# Patient Record
Sex: Female | Born: 1965 | Race: Black or African American | Hispanic: No | Marital: Married | Smoking: Never smoker
Health system: Southern US, Community
[De-identification: ages and names within clinical notes are randomized; demographics above are authoritative.]

## PROBLEM LIST (undated history)

## (undated) DIAGNOSIS — I10 Essential (primary) hypertension: Secondary | ICD-10-CM

## (undated) DIAGNOSIS — R252 Cramp and spasm: Secondary | ICD-10-CM

## (undated) DIAGNOSIS — E119 Type 2 diabetes mellitus without complications: Secondary | ICD-10-CM

## (undated) DIAGNOSIS — E1169 Type 2 diabetes mellitus with other specified complication: Secondary | ICD-10-CM

## (undated) DIAGNOSIS — H6122 Impacted cerumen, left ear: Secondary | ICD-10-CM

## (undated) DIAGNOSIS — E039 Hypothyroidism, unspecified: Secondary | ICD-10-CM

## (undated) DIAGNOSIS — E785 Hyperlipidemia, unspecified: Secondary | ICD-10-CM

## (undated) HISTORY — PX: ABDOMINAL HYSTERECTOMY: SHX81

## (undated) HISTORY — DX: Essential (primary) hypertension: I10

## (undated) HISTORY — DX: Impacted cerumen, left ear: H61.22

## (undated) HISTORY — DX: Hyperlipidemia, unspecified: E78.5

## (undated) HISTORY — DX: Type 2 diabetes mellitus without complications: E11.9

---
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
## 2007-02-12 ENCOUNTER — Ambulatory Visit: Payer: Self-pay | Admitting: Family Medicine

## 2007-03-05 ENCOUNTER — Ambulatory Visit: Payer: Self-pay | Admitting: Family Medicine

## 2007-07-27 ENCOUNTER — Ambulatory Visit: Payer: Self-pay | Admitting: Family Medicine

## 2008-07-15 ENCOUNTER — Ambulatory Visit: Payer: Self-pay

## 2009-02-22 ENCOUNTER — Emergency Department: Payer: Self-pay | Admitting: Unknown Physician Specialty

## 2009-04-18 ENCOUNTER — Encounter: Payer: Self-pay | Admitting: Unknown Physician Specialty

## 2009-04-29 ENCOUNTER — Encounter: Payer: Self-pay | Admitting: Unknown Physician Specialty

## 2009-05-30 ENCOUNTER — Encounter: Payer: Self-pay | Admitting: Unknown Physician Specialty

## 2009-05-31 ENCOUNTER — Ambulatory Visit: Payer: Self-pay

## 2009-06-27 ENCOUNTER — Ambulatory Visit: Payer: Self-pay | Admitting: Internal Medicine

## 2009-07-06 ENCOUNTER — Ambulatory Visit: Payer: Self-pay | Admitting: Internal Medicine

## 2009-07-28 ENCOUNTER — Ambulatory Visit: Payer: Self-pay | Admitting: Internal Medicine

## 2009-08-24 ENCOUNTER — Ambulatory Visit: Payer: Self-pay

## 2009-08-27 ENCOUNTER — Ambulatory Visit: Payer: Self-pay | Admitting: Internal Medicine

## 2009-09-21 ENCOUNTER — Ambulatory Visit: Payer: Self-pay | Admitting: Gastroenterology

## 2009-09-28 ENCOUNTER — Ambulatory Visit: Payer: Self-pay | Admitting: Gastroenterology

## 2009-10-27 ENCOUNTER — Ambulatory Visit: Payer: Self-pay | Admitting: Internal Medicine

## 2009-11-02 ENCOUNTER — Ambulatory Visit: Payer: Self-pay | Admitting: Internal Medicine

## 2009-11-07 ENCOUNTER — Ambulatory Visit: Payer: Self-pay | Admitting: Internal Medicine

## 2009-11-27 ENCOUNTER — Ambulatory Visit: Payer: Self-pay | Admitting: Internal Medicine

## 2009-12-28 ENCOUNTER — Ambulatory Visit: Payer: Self-pay | Admitting: Internal Medicine

## 2010-01-27 ENCOUNTER — Ambulatory Visit: Payer: Self-pay | Admitting: Internal Medicine

## 2010-02-27 ENCOUNTER — Ambulatory Visit: Payer: Self-pay | Admitting: Internal Medicine

## 2010-11-13 ENCOUNTER — Ambulatory Visit: Payer: Self-pay

## 2011-11-28 ENCOUNTER — Ambulatory Visit: Payer: Self-pay | Admitting: Family Medicine

## 2012-09-13 LAB — HM COLONOSCOPY: HM Colonoscopy: NORMAL

## 2012-11-09 ENCOUNTER — Ambulatory Visit: Payer: Self-pay | Admitting: Family Medicine

## 2012-11-17 ENCOUNTER — Other Ambulatory Visit: Payer: Self-pay | Admitting: Family Medicine

## 2012-11-17 LAB — CBC WITH DIFFERENTIAL/PLATELET
Eosinophil %: 0.7 %
HCT: 32.2 % — ABNORMAL LOW (ref 35.0–47.0)
HGB: 10.1 g/dL — ABNORMAL LOW (ref 12.0–16.0)
Lymphocyte #: 1.5 10*3/uL (ref 1.0–3.6)
Lymphocyte %: 38.5 %
MCHC: 31.3 g/dL — ABNORMAL LOW (ref 32.0–36.0)
Monocyte #: 0.3 x10 3/mm (ref 0.2–0.9)
Monocyte %: 8.1 %
Neutrophil %: 50.8 %
Platelet: 283 10*3/uL (ref 150–440)
WBC: 3.9 10*3/uL (ref 3.6–11.0)

## 2012-11-17 LAB — FERRITIN: Ferritin (ARMC): 11 ng/mL (ref 8–388)

## 2012-11-17 LAB — IRON AND TIBC
Iron Bind.Cap.(Total): 414 ug/dL (ref 250–450)
Iron: 38 ug/dL — ABNORMAL LOW (ref 50–170)
Unbound Iron-Bind.Cap.: 376 ug/dL

## 2012-12-10 ENCOUNTER — Ambulatory Visit: Payer: Self-pay | Admitting: Family Medicine

## 2013-03-10 ENCOUNTER — Other Ambulatory Visit: Payer: Self-pay | Admitting: Internal Medicine

## 2013-03-10 LAB — TSH: Thyroid Stimulating Horm: 3.12 u[IU]/mL

## 2013-06-10 ENCOUNTER — Other Ambulatory Visit: Payer: Self-pay | Admitting: Internal Medicine

## 2013-06-10 LAB — TSH: Thyroid Stimulating Horm: 2.44 u[IU]/mL

## 2013-08-03 ENCOUNTER — Ambulatory Visit: Payer: Self-pay | Admitting: Obstetrics and Gynecology

## 2013-08-03 LAB — CBC
HCT: 43.4 % (ref 35.0–47.0)
HGB: 14.4 g/dL (ref 12.0–16.0)
MCH: 26.3 pg (ref 26.0–34.0)
MCHC: 33.1 g/dL (ref 32.0–36.0)
MCV: 80 fL (ref 80–100)
Platelet: 191 10*3/uL (ref 150–440)
RBC: 5.46 10*6/uL — AB (ref 3.80–5.20)
RDW: 15.1 % — ABNORMAL HIGH (ref 11.5–14.5)
WBC: 4.7 10*3/uL (ref 3.6–11.0)

## 2013-08-03 LAB — BASIC METABOLIC PANEL
Anion Gap: 3 — ABNORMAL LOW (ref 7–16)
BUN: 12 mg/dL (ref 7–18)
CALCIUM: 9.1 mg/dL (ref 8.5–10.1)
Chloride: 99 mmol/L (ref 98–107)
Co2: 33 mmol/L — ABNORMAL HIGH (ref 21–32)
Creatinine: 0.73 mg/dL (ref 0.60–1.30)
EGFR (African American): >60
GLUCOSE: 225 mg/dL — AB (ref 65–99)
Osmolality: 277 (ref 275–301)
POTASSIUM: 3.6 mmol/L (ref 3.5–5.1)
Sodium: 135 mmol/L — ABNORMAL LOW (ref 136–145)

## 2013-08-10 ENCOUNTER — Inpatient Hospital Stay: Payer: Self-pay | Admitting: Obstetrics and Gynecology

## 2013-08-10 HISTORY — PX: ABDOMINAL HYSTERECTOMY: SHX81

## 2013-08-11 LAB — CREATININE, SERUM
Creatinine: 0.79 mg/dL (ref 0.60–1.30)
EGFR (African American): >60

## 2013-08-11 LAB — HEMOGLOBIN: HGB: 12.7 g/dL (ref 12.0–16.0)

## 2013-08-13 LAB — PATHOLOGY REPORT

## 2013-09-15 ENCOUNTER — Ambulatory Visit: Payer: Self-pay | Admitting: Family Medicine

## 2013-09-15 LAB — COMPREHENSIVE METABOLIC PANEL
ALT: 36 U/L (ref 12–78)
Albumin: 3.8 g/dL (ref 3.4–5.0)
Alkaline Phosphatase: 107 U/L
Anion Gap: 8 (ref 7–16)
BILIRUBIN TOTAL: 0.4 mg/dL (ref 0.2–1.0)
BUN: 11 mg/dL (ref 7–18)
CHLORIDE: 97 mmol/L — AB (ref 98–107)
CREATININE: 0.56 mg/dL — AB (ref 0.60–1.30)
Calcium, Total: 9.4 mg/dL (ref 8.5–10.1)
Co2: 31 mmol/L (ref 21–32)
GLUCOSE: 213 mg/dL — AB (ref 65–99)
Osmolality: 278 (ref 275–301)
Potassium: 3.5 mmol/L (ref 3.5–5.1)
SGOT(AST): 16 U/L (ref 15–37)
Sodium: 136 mmol/L (ref 136–145)
TOTAL PROTEIN: 8 g/dL (ref 6.4–8.2)

## 2013-09-15 LAB — IRON AND TIBC
IRON SATURATION: 36 %
Iron Bind.Cap.(Total): 298 ug/dL (ref 250–450)
Iron: 108 ug/dL (ref 50–170)
UNBOUND IRON-BIND. CAP.: 190 ug/dL

## 2013-09-15 LAB — CBC WITH DIFFERENTIAL/PLATELET
BASOS ABS: 0 10*3/uL (ref 0.0–0.1)
BASOS PCT: 0.8 %
Eosinophil #: 0 10*3/uL (ref 0.0–0.7)
Eosinophil %: 0.9 %
HCT: 45.6 % (ref 35.0–47.0)
HGB: 14.7 g/dL (ref 12.0–16.0)
Lymphocyte #: 1.4 10*3/uL (ref 1.0–3.6)
Lymphocyte %: 25.1 %
MCH: 26.2 pg (ref 26.0–34.0)
MCHC: 32.3 g/dL (ref 32.0–36.0)
MCV: 81 fL (ref 80–100)
MONO ABS: 0.3 x10 3/mm (ref 0.2–0.9)
Monocyte %: 5.5 %
NEUTROS PCT: 67.7 %
Neutrophil #: 3.8 10*3/uL (ref 1.4–6.5)
Platelet: 258 10*3/uL (ref 150–440)
RBC: 5.62 10*6/uL — ABNORMAL HIGH (ref 3.80–5.20)
RDW: 15.3 % — ABNORMAL HIGH (ref 11.5–14.5)
WBC: 5.7 10*3/uL (ref 3.6–11.0)

## 2013-09-15 LAB — FERRITIN: FERRITIN (ARMC): 75 ng/mL (ref 8–388)

## 2013-09-15 LAB — LIPID PANEL
CHOLESTEROL: 197 mg/dL (ref 0–200)
HDL: 62 mg/dL — AB (ref 40–60)
LDL CHOLESTEROL, CALC: 95 mg/dL (ref 0–100)
Triglycerides: 199 mg/dL (ref 0–200)
VLDL Cholesterol, Calc: 40 mg/dL (ref 5–40)

## 2014-02-03 ENCOUNTER — Ambulatory Visit: Payer: Self-pay | Admitting: Family Medicine

## 2014-02-03 LAB — COMPREHENSIVE METABOLIC PANEL
ANION GAP: 9 (ref 7–16)
Albumin: 3.6 g/dL (ref 3.4–5.0)
Alkaline Phosphatase: 107 U/L
BUN: 12 mg/dL (ref 7–18)
Bilirubin,Total: 0.5 mg/dL (ref 0.2–1.0)
CO2: 30 mmol/L (ref 21–32)
CREATININE: 0.74 mg/dL (ref 0.60–1.30)
Calcium, Total: 8.6 mg/dL (ref 8.5–10.1)
Chloride: 100 mmol/L (ref 98–107)
EGFR (Non-African Amer.): >60
GLUCOSE: 216 mg/dL — AB (ref 65–99)
OSMOLALITY: 284 (ref 275–301)
Potassium: 3.5 mmol/L (ref 3.5–5.1)
SGOT(AST): 38 U/L — ABNORMAL HIGH (ref 15–37)
SGPT (ALT): 54 U/L
SODIUM: 139 mmol/L (ref 136–145)
TOTAL PROTEIN: 7.5 g/dL (ref 6.4–8.2)

## 2014-02-03 LAB — LIPID PANEL
CHOLESTEROL: 122 mg/dL (ref 0–200)
HDL Cholesterol: 56 mg/dL (ref 40–60)
Ldl Cholesterol, Calc: 45 mg/dL (ref 0–100)
TRIGLYCERIDES: 104 mg/dL (ref 0–200)
VLDL Cholesterol, Calc: 21 mg/dL (ref 5–40)

## 2014-02-03 LAB — TSH: THYROID STIMULATING HORM: 4.56 u[IU]/mL — AB

## 2014-02-04 ENCOUNTER — Ambulatory Visit: Payer: Self-pay | Admitting: Family Medicine

## 2014-02-13 LAB — HM MAMMOGRAPHY: HM Mammogram: NORMAL

## 2014-02-24 ENCOUNTER — Ambulatory Visit: Payer: Self-pay | Admitting: Family Medicine

## 2014-05-16 LAB — LIPID PANEL: LDL Cholesterol: 59 mg/dL

## 2014-05-19 ENCOUNTER — Ambulatory Visit: Payer: Self-pay | Admitting: Family Medicine

## 2014-05-19 LAB — LIPID PANEL
CHOLESTEROL: 150 mg/dL (ref 0–200)
Cholesterol: 150 mg/dL (ref 0–200)
HDL Cholesterol: 68 mg/dL — ABNORMAL HIGH (ref 40–60)
HDL: 68 mg/dL (ref 35–70)
Ldl Cholesterol, Calc: 59 mg/dL (ref 0–100)
Triglycerides: 114 mg/dL (ref 0–200)
Triglycerides: 114 mg/dL (ref 40–160)
VLDL Cholesterol, Calc: 23 mg/dL (ref 5–40)

## 2014-05-19 LAB — COMPREHENSIVE METABOLIC PANEL
ALK PHOS: 102 U/L
ALT: 57 U/L
Albumin: 3.8 g/dL (ref 3.4–5.0)
Anion Gap: 3 — ABNORMAL LOW (ref 7–16)
BUN: 10 mg/dL (ref 7–18)
Bilirubin,Total: 0.5 mg/dL (ref 0.2–1.0)
CALCIUM: 9 mg/dL (ref 8.5–10.1)
CO2: 35 mmol/L — AB (ref 21–32)
Chloride: 99 mmol/L (ref 98–107)
Creatinine: 0.69 mg/dL (ref 0.60–1.30)
EGFR (African American): >60
EGFR (Non-African Amer.): >60
Glucose: 186 mg/dL — ABNORMAL HIGH (ref 65–99)
OSMOLALITY: 278 (ref 275–301)
Potassium: 3.6 mmol/L (ref 3.5–5.1)
SGOT(AST): 24 U/L (ref 15–37)
SODIUM: 137 mmol/L (ref 136–145)
Total Protein: 7.3 g/dL (ref 6.4–8.2)

## 2014-05-19 LAB — TSH: THYROID STIMULATING HORM: 1.7 u[IU]/mL

## 2014-05-31 DIAGNOSIS — E1169 Type 2 diabetes mellitus with other specified complication: Secondary | ICD-10-CM

## 2014-05-31 DIAGNOSIS — E1165 Type 2 diabetes mellitus with hyperglycemia: Secondary | ICD-10-CM | POA: Insufficient documentation

## 2014-05-31 DIAGNOSIS — E785 Hyperlipidemia, unspecified: Secondary | ICD-10-CM

## 2014-05-31 DIAGNOSIS — K219 Gastro-esophageal reflux disease without esophagitis: Secondary | ICD-10-CM | POA: Insufficient documentation

## 2014-05-31 HISTORY — DX: Type 2 diabetes mellitus with other specified complication: E11.69

## 2014-08-15 DIAGNOSIS — E119 Type 2 diabetes mellitus without complications: Secondary | ICD-10-CM

## 2014-08-15 DIAGNOSIS — I10 Essential (primary) hypertension: Secondary | ICD-10-CM | POA: Insufficient documentation

## 2014-08-20 NOTE — Op Note (Signed)
PATIENT NAME:  Valerie Manning, Valerie Manning MR#:  [id number] DATE OF BIRTH:  [birth year]  DATE OF PROCEDURE:  08/10/2013  PREOPERATIVE DIAGNOSES: 1.  Leiomyoma, symptomatic.  2.  Menorrhagia to anemia.   POSTOPERATIVE DIAGNOSES: 1.  Leiomyoma, symptomatic.  2.  Menorrhagia to anemia.  3.  Pelvic adhesive disease.   OPERATIVE PROCEDURE: Total abdominal hysterectomy, left salpingo-oophorectomy and right salpingectomy.   SURGEON: Brayton Mars, M.D.   FIRST ASSISTANT: Dr. Marcelline Mates.   SECOND ASSISTANT: \\\\\\\\\\\\\\Carys Roberts, PA-S.   ANESTHESIA: General endotracheal.   INDICATIONS: The patient is a 49 year old African American female, para 2-0-1-2, six months of Depo-Lupron therapy with subsequent reduction of uterine fibroids to 12 to 14 week size, presents for surgical management of fibroid uterus. 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   FINDINGS AT SURGERY: Revealed a multifibroid uterus approximately 12 weeks' size. There were dense adhesions between the bowel and the posterior uterus and the cul-de-sac. In addition, there were left adnexal adhesions to the left posterior uterus. The right tube and ovary appeared grossly normal. The uterus demonstrated some multiple clear cystic lesions 3 mm to 5 mm in diameter, noted on the posterior fundus region. The upper abdomen was normal including a smooth peritoneal surface, diaphragm and liver. Gallbladder was not tense and did not demonstrate stones. The appendix appeared grossly normal.   DESCRIPTION OF PROCEDURE: The patient was brought to the operating room where she was placed in the supine position. General anesthesia was induced without difficulty. A ChloraPrep abdominal prep and drape was performed in standard fashion. A Foley catheter was placed and was draining clear yellow urine. A Pfannenstiel incision was made in the abdomen. The fascia was incised transversely and dissected off the rectus muscle through sharp and  blunt dissection. The midline raphe was identified, separated and the peritoneum was entered. An O'Conner-O'Sullivan retractor was placed to facilitate exposure. Bladder blade was placed. Laps were used to pack off the bowel. The uterus was elevated using a double-tooth tenaculum. Adhesions between the left adnexa and posterior uterine surface were dissected sharply. The adhesions between the bowel and the posterior lower uterine segment were likewise sharply dissected. Once anatomy was restored to somewhat normal orientation, the hysterectomy was performed in routine manner. Round ligaments were doubly clamped, cut, and stick tied using 0 Vicryl suture. The anterior and posterior leafs of the broad ligament were opened and the bladder flap was created through sharp dissection. The right utero-ovarian ligament was doubly clamped, cut, and stick tied using 0 Vicryl suture. The left IP ligament was isolated and mobilized with ligament being crossclamped adjacent to the ovary and fallopian tube away from the course of the ureter. This was transected and doubly ligated using 0 Vicryl suture. The cardinal broad ligament complexes were dissected and cleaned to expose uterine vessels. The uterine vessels were doubly clamped, cut, and stick tied using 0 Vicryl suture. The cardinal broad ligament complexes were then sequentially clamped, cut, and stick tied in a continuous process down to the cervicovaginal junction. At this point the angles of the vagina were ligated using 0 Vicryl suture. The intervening cervicovaginal mucosa was incised and the specimen was removed from the operative field. The cuff was closed with figure-of-eight sutures of 0 Vicryl. Irrigation of the pelvis was performed and demonstrated good hemostasis. The right adnexa was then assessed and the right fallopian tube was crossclamped and excised. This pedicle was stick tied using 0 Vicryl suture. Once satisfied with hemostasis, the pelvis was  irrigated.  The irrigant fluid was aspirated. The abdomen was closed in layers with 0 Maxon being used on the fascia in a simple running manner. The subcuticular adipose tissue was reapproximated using a simple running stitch of 2-0 Vicryl. The skin was closed with a subcuticular stitch of 4-0 Vicryl. Dermabond was placed over the incision. A Telfa dressing was likewise placed. The patient was then awakened, extubated and taken to the recovery room was satisfactory condition.   ESTIMATED BLOOD LOSS: 150 mL.   INTRAVENOUS FLUIDS: 1200 mL.   URINE OUTPUT: 150 mL.   COUNTS:  All instruments, needle, and sponge counts were verified as correct.     ____________________________ Alanda Slim. Nihar Klus, MD mad:dp D: 08/10/2013 13:01:57 ET T: 08/10/2013 13:36:02 ET JOB#: [id number]  cc: Hassell Done A. Dayle Sherpa, MD, <Dictator> [facility]  Alanda Slim Tekla Malachowski MD ELECTRONICALLY SIGNED 08/12/2013 12:34

## 2014-09-29 ENCOUNTER — Telehealth: Payer: Self-pay | Admitting: Family Medicine

## 2014-09-29 ENCOUNTER — Telehealth: Payer: Self-pay

## 2014-09-29 MED ORDER — GLUCOSE BLOOD VI STRP: ORAL_STRIP | Status: DC

## 2014-09-29 MED ORDER — ATORVASTATIN CALCIUM 20 MG PO TABS: 20.0000 mg | ORAL_TABLET | Freq: Once | ORAL | Status: DC

## 2014-09-29 MED ORDER — AMOXICILLIN 500 MG PO CAPS: 500.0000 mg | ORAL_CAPSULE | Freq: Two times a day (BID) | ORAL | Status: DC

## 2014-09-29 MED ORDER — OMEPRAZOLE 20 MG PO CPDR: 20.0000 mg | DELAYED_RELEASE_CAPSULE | Freq: Once | ORAL | Status: DC

## 2014-09-29 NOTE — Telephone Encounter (Signed)
Per doctor request patient recheduled her appointment for July. She is requesting a refill on Omeprazole, atorvastatin, and one touch ultra blue test strips medication. Please send to [facility]

## 2014-09-29 NOTE — Addendum Note (Signed)
Addended by: Elouise Munroe on: 09/29/2014 10:43 AM   Modules accepted: Orders

## 2014-09-29 NOTE — Telephone Encounter (Signed)
GBS pos urine culture- per mad faxed amoxicillin 500 bid x 7. Pt aware. cm

## 2014-09-29 NOTE — Telephone Encounter (Signed)
Script sent  

## 2014-10-03 ENCOUNTER — Ambulatory Visit: Payer: Self-pay | Admitting: Family Medicine

## 2014-10-19 ENCOUNTER — Encounter: Payer: Self-pay | Admitting: Obstetrics and Gynecology

## 2014-10-20 ENCOUNTER — Telehealth: Payer: Self-pay

## 2014-10-20 NOTE — Telephone Encounter (Signed)
-----   Message from Brayton Mars, MD sent at 10/19/2014 10:20 PM EDT ----- Please Notify - Labs normal

## 2014-10-20 NOTE — Telephone Encounter (Signed)
Pt aware.

## 2014-11-09 ENCOUNTER — Ambulatory Visit (INDEPENDENT_AMBULATORY_CARE_PROVIDER_SITE_OTHER): Payer: Federal, State, Local not specified - PPO | Admitting: Family Medicine

## 2014-11-09 ENCOUNTER — Encounter: Payer: Self-pay | Admitting: Family Medicine

## 2014-11-09 VITALS — BP 122/82 | HR 100 | Temp 98.6°F | Resp 16 | Ht 63.0 in | Wt 170.3 lb

## 2014-11-09 DIAGNOSIS — E669 Obesity, unspecified: Secondary | ICD-10-CM | POA: Diagnosis not present

## 2014-11-09 DIAGNOSIS — E785 Hyperlipidemia, unspecified: Secondary | ICD-10-CM

## 2014-11-09 DIAGNOSIS — I10 Essential (primary) hypertension: Secondary | ICD-10-CM

## 2014-11-09 DIAGNOSIS — E1165 Type 2 diabetes mellitus with hyperglycemia: Secondary | ICD-10-CM

## 2014-11-09 DIAGNOSIS — IMO0002 Reserved for concepts with insufficient information to code with codable children: Secondary | ICD-10-CM

## 2014-11-09 LAB — POCT GLYCOSYLATED HEMOGLOBIN (HGB A1C): HEMOGLOBIN A1C: 7.2

## 2014-11-09 LAB — POCT UA - MICROALBUMIN: Microalbumin Ur, POC: 20 mg/L

## 2014-11-09 LAB — GLUCOSE, POCT (MANUAL RESULT ENTRY): POC GLUCOSE: 226 mg/dL — AB (ref 70–99)

## 2014-11-09 NOTE — Addendum Note (Signed)
Addended by: Lolita Rieger D on: 11/09/2014 11:53 AM   Modules accepted: Orders

## 2014-11-09 NOTE — Progress Notes (Signed)
Name: Valerie Manning   MRN: [id number]    DOB: [birth year]   Date:11/09/2014       Progress Note  Subjective  Chief Complaint  Chief Complaint  Patient presents with  . Diabetes    Diabetes She presents for her follow-up diabetic visit. She has type 2 diabetes mellitus. Her disease course has been worsening. There are no hypoglycemic associated symptoms. Pertinent negatives for hypoglycemia include no dizziness, headaches, nervousness/anxiousness, seizures or tremors. Pertinent negatives for diabetes include no blurred vision, no chest pain, no weakness and no weight loss. Symptoms are worsening. Risk factors for coronary artery disease include diabetes mellitus, dyslipidemia, hypertension, obesity, post-menopausal, sedentary lifestyle and stress. Current diabetic treatment includes oral agent (dual therapy). She is compliant with treatment all of the time. Her weight is stable. She is following a generally healthy diet. She has had a previous visit with a dietitian. She rarely participates in exercise. There is no change in her home blood glucose trend. Her highest blood glucose is >200 mg/dl. An ACE inhibitor/angiotensin II receptor blocker is being taken.  Hyperlipidemia This is a chronic problem. The current episode started more than 1 year ago. The problem is controlled. Recent lipid tests were reviewed and are normal. Exacerbating diseases include diabetes and obesity. Factors aggravating her hyperlipidemia include fatty foods. Pertinent negatives include no chest pain, focal weakness, myalgias or shortness of breath. Current antihyperlipidemic treatment includes statins. The current treatment provides moderate improvement of lipids. There are no compliance problems.  Risk factors for coronary artery disease include diabetes mellitus, dyslipidemia, hypertension, obesity, post-menopausal, a sedentary lifestyle and stress.  Hypertension This is a chronic problem. The current episode started more  than 1 year ago. The problem is unchanged. The problem is controlled. Pertinent negatives include no blurred vision, chest pain, headaches, neck pain, orthopnea, palpitations or shortness of breath. Past treatments include angiotensin blockers and diuretics. The current treatment provides moderate improvement. There are no compliance problems.    Obesity    Past Medical History  Diagnosis Date  . Diabetes mellitus without complication   . Hypertension   . Hyperlipidemia     History  Substance Use Topics  . Smoking status: Never Smoker   . Smokeless tobacco: Not on file  . Alcohol Use: No     Current outpatient prescriptions:  .  atorvastatin (LIPITOR) 20 MG tablet, Take 1 tablet (20 mg total) by mouth once., Disp: 30 tablet, Rfl: 1 .  glimepiride (AMARYL) 2 MG tablet, Take 2 mg by mouth daily., Disp: , Rfl: 0 .  glucose blood (ONE TOUCH TEST STRIPS) test strip, Test BS twice daily  DX E11.9, Disp: 100 each, Rfl: 12 .  Multiple Vitamin (MULTIVITAMIN) tablet, Take 1 tablet by mouth daily., Disp: , Rfl:  .  omeprazole (PRILOSEC) 20 MG capsule, Take 1 capsule (20 mg total) by mouth once., Disp: 30 capsule, Rfl: 1 .  SYNTHROID 150 MCG tablet, Take 150 mcg by mouth daily., Disp: , Rfl: 0 .  valsartan-hydrochlorothiazide (DIOVAN-HCT) 160-12.5 MG per tablet, , Disp: , Rfl: 0 .  aspirin EC 81 MG tablet, Take by mouth., Disp: , Rfl:   Allergies  Allergen Reactions  . Metformin And Related Diarrhea  . Metformin Hcl Diarrhea    Review of Systems  Constitutional: Negative for fever, chills and weight loss.  HENT: Negative for congestion, hearing loss, sore throat and tinnitus.   Eyes: Negative for blurred vision, double vision and redness.  Respiratory: Negative for cough, hemoptysis and shortness  of breath.   Cardiovascular: Negative for chest pain, palpitations, orthopnea, claudication and leg swelling.  Gastrointestinal: Negative for heartburn, nausea, vomiting, diarrhea,  constipation and blood in stool.  Genitourinary: Negative for dysuria, urgency, frequency and hematuria.  Musculoskeletal: Negative for myalgias, back pain, joint pain, falls and neck pain.  Skin: Negative for itching.  Neurological: Negative for dizziness, tingling, tremors, focal weakness, seizures, loss of consciousness, weakness and headaches.  Endo/Heme/Allergies: Does not bruise/bleed easily.  Psychiatric/Behavioral: Negative for depression and substance abuse. The patient is not nervous/anxious and does not have insomnia.      Objective  Filed Vitals:   11/09/14 1021  BP: 122/82  Pulse: 100  Temp: 98.6 F (37 C)  Resp: 16  Height: 5\' 3"  (1.6 m)  Weight: 170 lb 5 oz (77.253 kg)  SpO2: 96%     Physical Exam  Constitutional: She is oriented to person, place, and time and well-developed, well-nourished, and in no distress.  HENT:  Head: Normocephalic.  Eyes: EOM are normal. Pupils are equal, round, and reactive to light.  Neck: Normal range of motion. No thyromegaly present.  Cardiovascular: Normal rate, regular rhythm and normal heart sounds.   No murmur heard. Pulmonary/Chest: Effort normal and breath sounds normal.  Abdominal: Soft. Bowel sounds are normal.  Musculoskeletal: Normal range of motion. She exhibits no edema.  Neurological: She is alert and oriented to person, place, and time. No cranial nerve deficit. Gait normal.  Skin: Skin is warm and dry. No rash noted.  Psychiatric: Memory and affect normal.      Assessment & Plan  1. Diabetes type 2, uncontrolled Encouraged compliance with diet and exercise - POCT Glucose (CBG) - POCT HgB A1C  2. Essential hypertension stable  3. Hyperlipemia controlled  4. Obesity Handout obesity

## 2014-11-09 NOTE — Patient Instructions (Signed)
Obesity Obesity is having too much body fat and a body mass index (BMI) of 30 or more. BMI is a number based on your height and weight. The number is an estimate of how much body fat you have. Obesity can happen if you eat more calories than you can burn by exercising or other activity. It can cause major health problems or emergencies.  HOME CARE  Exercise and be active as told by your doctor. Try:  Using stairs when you can.  Parking farther away from store doors.  Gardening, biking, or walking.  Eat healthy foods and drinks that are low in calories. Eat more fruits and vegetables.  Limit fast food, sweets, and snack foods that are made with ingredients that are not natural (processed food).  Eat smaller amounts of food.  Keep a journal and write down what you eat every day. Websites can help with this.  Avoid drinking alcohol. Drink more water and drinks without calories.   Take vitamins and dietary pills (supplements) only as told by your doctor.  Try going to weight-loss support groups or classes to help lessen stress. Dietitians and counselors may also help. GET HELP RIGHT AWAY IF:  You have chest pain or tightness.  You have trouble breathing or feel short of breath.  You feel weak or have loss of feeling (numbness) in your legs.  You feel confused or have trouble talking.  You have sudden changes in your vision. MAKE SURE YOU:  Understand these instructions.  Will watch your condition.  Will get help right away if you are not doing well or get worse. Document Released: 07/08/2011 Document Revised: 08/30/2013 Document Reviewed: 07/08/2011 [facility] Patient Information 2015 [place], [place]. This information is not intended to replace advice given to you by your health care provider. Make sure you discuss any questions you have with your health care provider.

## 2014-11-10 ENCOUNTER — Other Ambulatory Visit: Payer: Self-pay | Admitting: Family Medicine

## 2014-11-28 ENCOUNTER — Other Ambulatory Visit: Payer: Self-pay | Admitting: Family Medicine

## 2015-01-03 ENCOUNTER — Other Ambulatory Visit: Payer: Self-pay | Admitting: Family Medicine

## 2015-01-23 ENCOUNTER — Other Ambulatory Visit: Payer: Self-pay | Admitting: Family Medicine

## 2015-03-10 ENCOUNTER — Other Ambulatory Visit: Payer: Self-pay | Admitting: Family Medicine

## 2015-03-13 ENCOUNTER — Encounter: Payer: Self-pay | Admitting: Family Medicine

## 2015-03-13 ENCOUNTER — Ambulatory Visit (INDEPENDENT_AMBULATORY_CARE_PROVIDER_SITE_OTHER): Payer: Federal, State, Local not specified - PPO | Admitting: Family Medicine

## 2015-03-13 VITALS — BP 118/72 | HR 74 | Temp 97.6°F | Resp 18 | Ht 63.0 in | Wt 167.1 lb

## 2015-03-13 DIAGNOSIS — E0865 Diabetes mellitus due to underlying condition with hyperglycemia: Secondary | ICD-10-CM | POA: Diagnosis not present

## 2015-03-13 DIAGNOSIS — K2100 Gastro-esophageal reflux disease with esophagitis, without bleeding: Secondary | ICD-10-CM

## 2015-03-13 DIAGNOSIS — E039 Hypothyroidism, unspecified: Secondary | ICD-10-CM

## 2015-03-13 DIAGNOSIS — E089 Diabetes mellitus due to underlying condition without complications: Secondary | ICD-10-CM

## 2015-03-13 DIAGNOSIS — I10 Essential (primary) hypertension: Secondary | ICD-10-CM | POA: Diagnosis not present

## 2015-03-13 DIAGNOSIS — D509 Iron deficiency anemia, unspecified: Secondary | ICD-10-CM

## 2015-03-13 DIAGNOSIS — E785 Hyperlipidemia, unspecified: Secondary | ICD-10-CM

## 2015-03-13 DIAGNOSIS — IMO0001 Reserved for inherently not codable concepts without codable children: Secondary | ICD-10-CM

## 2015-03-13 DIAGNOSIS — Z862 Personal history of diseases of the blood and blood-forming organs and certain disorders involving the immune mechanism: Secondary | ICD-10-CM | POA: Insufficient documentation

## 2015-03-13 DIAGNOSIS — D219 Benign neoplasm of connective and other soft tissue, unspecified: Secondary | ICD-10-CM | POA: Insufficient documentation

## 2015-03-13 DIAGNOSIS — E669 Obesity, unspecified: Secondary | ICD-10-CM

## 2015-03-13 HISTORY — DX: Hypothyroidism, unspecified: E03.9

## 2015-03-13 LAB — GLUCOSE, POCT (MANUAL RESULT ENTRY): POC GLUCOSE: 196 mg/dL — AB (ref 70–99)

## 2015-03-13 LAB — POCT GLYCOSYLATED HEMOGLOBIN (HGB A1C): HEMOGLOBIN A1C: 8

## 2015-03-13 MED ORDER — GLIMEPIRIDE 4 MG PO TABS: 4.0000 mg | ORAL_TABLET | Freq: Every day | ORAL | Status: DC

## 2015-03-13 NOTE — Progress Notes (Signed)
Name: Valerie Manning   MRN: [id number]    DOB: 1965-05-30   Date:03/13/2015       Progress Note  Subjective  Chief Complaint  Chief Complaint  Patient presents with  . Diabetes  . Obesity  . Hypertension  . Hyperlipidemia    HPI  Diabetes  Patient presents for follow-up of diabetes which is present for over 5 years. Is currently on a regimen of glimepiride 2 mg daily . Patient states is usually compliant with their diet and exercise. There's been no hypoglycemic episodes and there are no polyuria polydipsia polyphagia. His average fasting glucoses been in the low around - with a high around - . There is no end organ disease.  Last diabetic eye exam was this year   Last visit with dietitian was this year. Last microalbumin was obtained July of this year and was normal at 20.   Hypertension   Patient presents for follow-up of hypertension. It has been present for over 5 years.  Patient states that there is compliance with medical regimen which consists of Diovan HCT 160-12.5 There is no end organ disease. Cardiac risk factors include hypertension hyperlipidemia and diabetes.  Exercise regimen consist of some walking.  Diet consist of some salt restriction  Hyperlipidemia  Patient has a history of hyperlipidemia for over 5 years.  Current medical regimen consist of atorvastatin 20 mg daily at bedtime .  Compliance is good.  Diet and exercise are currently followed fairly well  .  Risk factors for cardiovascular disease include hyperlipidemia - .   There have been no side effects from the medication.      Past Medical History  Diagnosis Date  . Diabetes mellitus without complication ([place])   . Hypertension   . Hyperlipidemia     Social History  Substance Use Topics  . Smoking status: Never Smoker   . Smokeless tobacco: Not on file  . Alcohol Use: No     Current outpatient prescriptions:  .  aspirin EC 81 MG tablet, Take by mouth., Disp: , Rfl:  .  atorvastatin (LIPITOR) 20 MG  tablet, take 1 tablet by mouth once daily, Disp: 30 tablet, Rfl: 1 .  doxycycline (VIBRAMYCIN) 100 MG capsule, take 1 capsule by mouth twice a day for 7 days, Disp: , Rfl: 0 .  glimepiride (AMARYL) 2 MG tablet, Take 2 mg by mouth daily., Disp: , Rfl: 0 .  glucose blood (ONE TOUCH TEST STRIPS) test strip, Test BS twice daily  DX E11.9, Disp: 100 each, Rfl: 12 .  Multiple Vitamin (MULTIVITAMIN) tablet, Take 1 tablet by mouth daily., Disp: , Rfl:  .  omeprazole (PRILOSEC) 20 MG capsule, take 1 capsule by mouth once daily, Disp: 30 capsule, Rfl: 1 .  SYNTHROID 150 MCG tablet, Take 150 mcg by mouth daily., Disp: , Rfl: 0 .  SYNTHROID 175 MCG tablet, , Disp: , Rfl: 0 .  valsartan-hydrochlorothiazide (DIOVAN-HCT) 160-12.5 MG per tablet, take 1 tablet once daily, Disp: 30 tablet, Rfl: 5  Allergies  Allergen Reactions  . Metformin And Related Diarrhea  . Metformin Hcl Diarrhea    Review of Systems  Constitutional: Negative for fever, chills and weight loss.  HENT: Negative for congestion, hearing loss, sore throat and tinnitus.   Eyes: Negative for blurred vision, double vision and redness.  Respiratory: Negative for cough, hemoptysis and shortness of breath.   Cardiovascular: Negative for chest pain, palpitations, orthopnea, claudication and leg swelling.  Gastrointestinal: Negative for heartburn, nausea, vomiting, diarrhea,  constipation and blood in stool.  Genitourinary: Negative for dysuria, urgency, frequency and hematuria.  Musculoskeletal: Negative for myalgias, back pain, joint pain, falls and neck pain.  Skin: Negative for itching.  Neurological: Negative for dizziness, tingling, tremors, focal weakness, seizures, loss of consciousness, weakness and headaches.  Endo/Heme/Allergies: Does not bruise/bleed easily.  Psychiatric/Behavioral: Negative for depression and substance abuse. The patient is not nervous/anxious and does not have insomnia.      Objective  Filed Vitals:   03/13/15  1031  BP: 118/72  Pulse: 74  Temp: 97.6 F (36.4 C)  Resp: 18  Height: 5\' 3"  (1.6 m)  Weight: 167 lb 1 oz (75.779 kg)  SpO2: 96%     Physical Exam  Constitutional: She is oriented to person, place, and time and well-developed, well-nourished, and in no distress.  HENT:  Head: Normocephalic.  Eyes: EOM are normal. Pupils are equal, round, and reactive to light.  Neck: Normal range of motion. No thyromegaly present.  Cardiovascular: Normal rate, regular rhythm and normal heart sounds.   No murmur heard. Pulmonary/Chest: Effort normal and breath sounds normal.  Abdominal: Soft. Bowel sounds are normal.  Musculoskeletal: Normal range of motion. She exhibits no edema.  Neurological: She is alert and oriented to person, place, and time. No cranial nerve deficit. Gait normal.  Skin: Skin is warm and dry. No rash noted.  Psychiatric: Memory and affect normal.      Assessment & Plan  1. Diabetes mellitus due to underlying condition, uncontrolled, without complication, without long-term current use of insulin (HCC) Moderate goal. Increase glimepiride to 4 mg daily - POCT Glucose (CBG) - POCT HgB A1C  2. Essential hypertension Well-controlled - Comprehensive Metabolic Panel (CMET)  3. Reflux esophagitis Well-controlled  4. Acquired hypothyroidism Followed by endocrinologist  5. Anemia, iron deficiency Using over-the-counter iron  6. Hyperlipemia Lipid panel encourage diet and exercise - Lipid Profile  7. Obesity Encourage diet and exercise

## 2015-03-14 ENCOUNTER — Other Ambulatory Visit
Admission: RE | Admit: 2015-03-14 | Discharge: 2015-03-14 | Disposition: A | Discharge status: Home / Self Care | Payer: Federal, State, Local not specified - PPO | Source: Ambulatory Visit | Attending: Family Medicine | Admitting: Family Medicine

## 2015-03-14 DIAGNOSIS — I10 Essential (primary) hypertension: Secondary | ICD-10-CM | POA: Diagnosis not present

## 2015-03-14 DIAGNOSIS — E785 Hyperlipidemia, unspecified: Secondary | ICD-10-CM | POA: Diagnosis present

## 2015-03-14 LAB — LIPID PANEL
CHOLESTEROL: 153 mg/dL (ref 0–200)
HDL: 62 mg/dL (ref >40)
LDL Cholesterol: 74 mg/dL (ref 0–99)
TRIGLYCERIDES: 83 mg/dL (ref <150)
Total CHOL/HDL Ratio: 2.5 RATIO
VLDL: 17 mg/dL (ref 0–40)

## 2015-03-14 LAB — COMPREHENSIVE METABOLIC PANEL
ALT: 46 U/L (ref 14–54)
ANION GAP: 6 (ref 5–15)
AST: 32 U/L (ref 15–41)
Albumin: 4.1 g/dL (ref 3.5–5.0)
Alkaline Phosphatase: 93 U/L (ref 38–126)
BUN: 12 mg/dL (ref 6–20)
CHLORIDE: 100 mmol/L — AB (ref 101–111)
CO2: 31 mmol/L (ref 22–32)
CREATININE: 0.71 mg/dL (ref 0.44–1.00)
Calcium: 9.5 mg/dL (ref 8.9–10.3)
Glucose, Bld: 241 mg/dL — ABNORMAL HIGH (ref 65–99)
Potassium: 3.8 mmol/L (ref 3.5–5.1)
Sodium: 137 mmol/L (ref 135–145)
Total Bilirubin: 0.8 mg/dL (ref 0.3–1.2)
Total Protein: 8.1 g/dL (ref 6.5–8.1)

## 2015-03-17 ENCOUNTER — Other Ambulatory Visit: Payer: Self-pay | Admitting: Family Medicine

## 2015-03-28 ENCOUNTER — Other Ambulatory Visit: Payer: Self-pay | Admitting: Family Medicine

## 2015-04-19 ENCOUNTER — Other Ambulatory Visit: Payer: Self-pay

## 2015-04-19 MED ORDER — GLIMEPIRIDE 4 MG PO TABS: 4.0000 mg | ORAL_TABLET | Freq: Every day | ORAL | Status: DC

## 2015-05-10 ENCOUNTER — Other Ambulatory Visit: Payer: Self-pay | Admitting: Family Medicine

## 2015-05-26 ENCOUNTER — Encounter: Payer: Self-pay | Admitting: Family Medicine

## 2015-05-26 ENCOUNTER — Ambulatory Visit (INDEPENDENT_AMBULATORY_CARE_PROVIDER_SITE_OTHER): Payer: Federal, State, Local not specified - PPO | Admitting: Family Medicine

## 2015-05-26 VITALS — BP 130/82 | HR 94 | Temp 98.6°F | Resp 16 | Ht 63.0 in | Wt 167.8 lb

## 2015-05-26 DIAGNOSIS — N760 Acute vaginitis: Secondary | ICD-10-CM

## 2015-05-26 DIAGNOSIS — B379 Candidiasis, unspecified: Secondary | ICD-10-CM

## 2015-05-26 DIAGNOSIS — A499 Bacterial infection, unspecified: Secondary | ICD-10-CM | POA: Diagnosis not present

## 2015-05-26 DIAGNOSIS — N898 Other specified noninflammatory disorders of vagina: Secondary | ICD-10-CM | POA: Insufficient documentation

## 2015-05-26 DIAGNOSIS — N644 Mastodynia: Secondary | ICD-10-CM | POA: Insufficient documentation

## 2015-05-26 DIAGNOSIS — Z1231 Encounter for screening mammogram for malignant neoplasm of breast: Secondary | ICD-10-CM

## 2015-05-26 DIAGNOSIS — T3695XA Adverse effect of unspecified systemic antibiotic, initial encounter: Secondary | ICD-10-CM

## 2015-05-26 DIAGNOSIS — B9689 Other specified bacterial agents as the cause of diseases classified elsewhere: Secondary | ICD-10-CM

## 2015-05-26 LAB — POCT WET PREP WITH KOH
KOH PREP POC: NEGATIVE
TRICHOMONAS UA: NEGATIVE

## 2015-05-26 MED ORDER — FLUCONAZOLE 150 MG PO TABS: 150.0000 mg | ORAL_TABLET | Freq: Once | ORAL | Status: DC

## 2015-05-26 MED ORDER — METRONIDAZOLE 500 MG PO TABS: 500.0000 mg | ORAL_TABLET | Freq: Two times a day (BID) | ORAL | Status: DC

## 2015-05-26 NOTE — Patient Instructions (Signed)

## 2015-05-26 NOTE — Progress Notes (Signed)
Name: Valerie Manning   MRN: [id number]    DOB: [birth year]   Date:05/26/2015       Progress Note  Subjective  Chief Complaint  Chief Complaint  Patient presents with  . Breast Pain    Onset 1 week, bilateral nipple pain  . Vaginal Itching    onset 1 week, itching and irritation    HPI  Valerie Manning is a 50 year old female patient of Dr. Serita Grit [facility]y's. Due to his PCP being out of the office unexpectedly today she is here to see me to discuss recent onset of bilateral breast pain and vaginal discharge and irritation. Onset of both 1 week ago. Pain is achy just at nipples w/o skin changes or discharge. Notes white scan discharge from vagina with irritation. No new sexual partners. Last menses over a year ago, reports having hysterectomy. Denies pregnancy.    Past Medical History  Diagnosis Date  . Diabetes mellitus without complication ([place])   . Hypertension   . Hyperlipidemia     Patient Active Problem List   Diagnosis Date Noted  . Absolute anemia 03/13/2015  . Abnormal findings on diagnostic imaging of urinary organs 03/13/2015  . Reflux esophagitis 03/13/2015  . HLD (hyperlipidemia) 03/13/2015  . Acquired hypothyroidism 03/13/2015  . Anemia, iron deficiency 03/13/2015  . Fibroid 03/13/2015  . Adiposity 03/13/2015  . Hyperlipemia 11/09/2014  . Obesity 11/09/2014  . Hypertension 08/15/2014  . Diabetes ([place]) 08/15/2014    Social History  Substance Use Topics  . Smoking status: Never Smoker   . Smokeless tobacco: Not on file  . Alcohol Use: No     Current outpatient prescriptions:  .  aspirin EC 81 MG tablet, Take by mouth., Disp: , Rfl:  .  atorvastatin (LIPITOR) 20 MG tablet, take 1 tablet by mouth once daily, Disp: 30 tablet, Rfl: 1 .  doxycycline (VIBRAMYCIN) 100 MG capsule, take 1 capsule by mouth twice a day for 7 days, Disp: , Rfl: 0 .  glimepiride (AMARYL) 4 MG tablet, Take 1 tablet (4 mg total) by mouth daily before breakfast., Disp: 90 tablet, Rfl: 0 .   glucose blood (ONE TOUCH TEST STRIPS) test strip, Test BS twice daily  DX E11.9, Disp: 100 each, Rfl: 12 .  Multiple Vitamin (MULTIVITAMIN) tablet, Take 1 tablet by mouth daily., Disp: , Rfl:  .  omeprazole (PRILOSEC) 20 MG capsule, take 1 capsule by mouth once daily, Disp: 30 capsule, Rfl: 1 .  SYNTHROID 175 MCG tablet, , Disp: , Rfl: 0 .  valsartan-hydrochlorothiazide (DIOVAN-HCT) 160-12.5 MG tablet, take 1 tablet once daily, Disp: 30 tablet, Rfl: 5  Past Surgical History  Procedure Laterality Date  . Abdominal hysterectomy      Family History  Problem Relation Age of Onset  . Diabetes Mother   . Diabetes Sister   . Diabetes Sister   . Diabetes Sister   . Thyroid disease Sister   . Diabetes Sister   . Thyroid disease Sister   . Thyroid disease Sister     Allergies  Allergen Reactions  . Metformin And Related Diarrhea  . Metformin Hcl Diarrhea     Review of Systems  CONSTITUTIONAL: No significant weight changes, fever, chills, weakness or fatigue.  CARDIOVASCULAR: No chest pain, chest pressure or chest discomfort. No palpitations or edema.  RESPIRATORY: No shortness of breath, cough or sputum.  GASTROINTESTINAL: No anorexia, nausea, vomiting. No changes in bowel habits. No abdominal pain or blood.  GENITOURINARY: No dysuria. No frequency. Yes discharge.  NEUROLOGICAL: No headache, dizziness, syncope, paralysis, ataxia, numbness or tingling in the extremities. No memory changes. No change in bowel or bladder control.  MUSCULOSKELETAL: No joint pain. No muscle pain. HEMATOLOGIC: No anemia, bleeding or bruising.  LYMPHATICS: No enlarged lymph nodes.  PSYCHIATRIC: No change in mood. No change in sleep pattern.  ENDOCRINOLOGIC: No reports of sweating, cold or heat intolerance. No polyuria or polydipsia.     Objective  BP 130/82 mmHg  Pulse 94  Temp(Src) 98.6 F (37 C) (Oral)  Resp 16  Ht 5\' 3"  (1.6 m)  Wt 167 lb 12.8 oz (76.114 kg)  BMI 29.73 kg/m2  SpO2 95% Body  mass index is 29.73 kg/(m^2).  Physical Exam  Constitutional: Patient appears well-developed and well-nourished. In no distress.   BREAST: Bilateral breast exam normal with no masses, skin changes or nipple discharge. FEMALE GENITALIA:  External genitalia normal External urethra normal Vaginal vault normal with scant white discharge, no lesions. Cervix normal without discharge or lesions Bimanual exam normal without masses RECTAL: no rectal masses or hemorrhoids  Cardiovascular: Normal rate, regular rhythm and normal heart sounds.  No murmur heard.  Pulmonary/Chest: Effort normal and breath sounds normal. No respiratory distress. Musculoskeletal: Normal range of motion bilateral UE and LE, no joint effusions. Peripheral vascular: Bilateral LE no edema. Neurological: CN II-XII grossly intact with no focal deficits. Alert and oriented to person, place, and time. Coordination, balance, strength, speech and gait are normal.  Skin: Skin is warm and dry. No rash noted. No erythema.  Psychiatric: Patient has a normal mood and affect. Behavior is normal in office today. Judgment and thought content normal in office today.  Results for orders placed or performed in visit on 05/26/15 (from the past 24 hour(s))  POCT Wet Prep with KOH     Status: Abnormal   Collection Time: 05/26/15  9:28 AM  Result Value Ref Range   Trichomonas, UA Negative    Clue Cells Wet Prep HPF POC Many    Epithelial Wet Prep HPF POC Few Few, Moderate, Many, Too numerous to count   Yeast Wet Prep HPF POC None    Bacteria Wet Prep HPF POC Many (A) None, Few, Too numerous to count   RBC Wet Prep HPF POC None    WBC Wet Prep HPF POC Few    KOH Prep POC Negative       Assessment & Plan  1. Breast pain in female Clinically no significant findings, will get imaging. Did discuss paget's breast disease as a possibility.   - MM Digital Diagnostic Bilat; Future - MM Digital Diagnostic Unilat L; Future - MM Digital  Diagnostic Unilat R; Future - US BREAST LTD UNI LEFT INC AXILLA; Future - US BREAST LTD UNI RIGHT INC AXILLA; Future  2. Encounter for screening mammogram for malignant neoplasm of breast  - MM Digital Screening; Future  3. Bacterial vaginosis  - metroNIDAZOLE (FLAGYL) 500 MG tablet; Take 1 tablet (500 mg total) by mouth 2 (two) times daily.  Dispense: 14 tablet; Refill: 0 - POCT Wet Prep with KOH; Standing - POCT Wet Prep with KOH  4. Antibiotic-induced yeast infection  - fluconazole (DIFLUCAN) 150 MG tablet; Take 1 tablet (150 mg total) by mouth once. After finishing antibiotics  Dispense: 1 tablet; Refill: 0

## 2015-05-31 HISTORY — PX: BREAST BIOPSY: SHX20

## 2015-06-02 ENCOUNTER — Ambulatory Visit
Admission: RE | Admit: 2015-06-02 | Discharge: 2015-06-02 | Disposition: A | Discharge status: Home / Self Care | Payer: Federal, State, Local not specified - PPO | Source: Ambulatory Visit | Attending: Family Medicine | Admitting: Family Medicine

## 2015-06-02 ENCOUNTER — Other Ambulatory Visit: Payer: Self-pay | Admitting: Family Medicine

## 2015-06-02 DIAGNOSIS — N63 Unspecified lump in breast: Secondary | ICD-10-CM | POA: Diagnosis not present

## 2015-06-02 DIAGNOSIS — N644 Mastodynia: Secondary | ICD-10-CM | POA: Diagnosis present

## 2015-06-02 DIAGNOSIS — N6001 Solitary cyst of right breast: Secondary | ICD-10-CM | POA: Insufficient documentation

## 2015-06-06 ENCOUNTER — Other Ambulatory Visit: Payer: Self-pay | Admitting: Family Medicine

## 2015-06-06 DIAGNOSIS — N6310 Unspecified lump in the right breast, unspecified quadrant: Secondary | ICD-10-CM

## 2015-06-09 ENCOUNTER — Other Ambulatory Visit: Payer: Self-pay | Admitting: Family Medicine

## 2015-06-09 ENCOUNTER — Other Ambulatory Visit: Payer: Self-pay | Admitting: Diagnostic Radiology

## 2015-06-09 ENCOUNTER — Ambulatory Visit
Admission: RE | Admit: 2015-06-09 | Discharge: 2015-06-09 | Disposition: A | Discharge status: Home / Self Care | Payer: Federal, State, Local not specified - PPO | Source: Ambulatory Visit | Attending: Family Medicine | Admitting: Family Medicine

## 2015-06-09 DIAGNOSIS — N63 Unspecified lump in breast: Secondary | ICD-10-CM | POA: Insufficient documentation

## 2015-06-09 DIAGNOSIS — N6011 Diffuse cystic mastopathy of right breast: Secondary | ICD-10-CM | POA: Insufficient documentation

## 2015-06-09 DIAGNOSIS — N6310 Unspecified lump in the right breast, unspecified quadrant: Secondary | ICD-10-CM

## 2015-06-12 ENCOUNTER — Other Ambulatory Visit: Payer: Self-pay | Admitting: Family Medicine

## 2015-06-12 DIAGNOSIS — R928 Other abnormal and inconclusive findings on diagnostic imaging of breast: Secondary | ICD-10-CM

## 2015-06-12 LAB — SURGICAL PATHOLOGY

## 2015-06-21 ENCOUNTER — Ambulatory Visit: Payer: Self-pay | Admitting: Physician Assistant

## 2015-06-21 ENCOUNTER — Encounter: Payer: Self-pay | Admitting: Physician Assistant

## 2015-06-21 VITALS — BP 122/80 | HR 80 | Temp 98.5°F

## 2015-06-21 DIAGNOSIS — Z0000 Encounter for general adult medical examination without abnormal findings: Secondary | ICD-10-CM

## 2015-06-21 NOTE — Progress Notes (Signed)
S: had some r ear pain yesterday, has gone away today but just wants to be checked bc she is having a breast biopsy done later this week  O: vitals wnl, nad, tms clear, nasal mucosa slightly swollen, throat wnl, neck supple no lymph lungs c t a, cv rrr  A: well adult  P: f/u prn

## 2015-06-22 ENCOUNTER — Ambulatory Visit
Admission: RE | Admit: 2015-06-22 | Discharge: 2015-06-22 | Disposition: A | Discharge status: Home / Self Care | Payer: Federal, State, Local not specified - PPO | Source: Ambulatory Visit | Attending: Family Medicine | Admitting: Family Medicine

## 2015-06-22 DIAGNOSIS — R928 Other abnormal and inconclusive findings on diagnostic imaging of breast: Secondary | ICD-10-CM

## 2015-06-23 ENCOUNTER — Telehealth: Payer: Self-pay

## 2015-06-23 DIAGNOSIS — N6310 Unspecified lump in the right breast, unspecified quadrant: Secondary | ICD-10-CM

## 2015-06-23 NOTE — Telephone Encounter (Signed)
[facility] called, they have done the breast biopsy and results are under imaging tab.  They recommended lump be removed by surgeon patient has been notified we will be making appointment because patient prefers [facility].  Please place referral.

## 2015-06-25 DIAGNOSIS — N6310 Unspecified lump in the right breast, unspecified quadrant: Secondary | ICD-10-CM | POA: Insufficient documentation

## 2015-06-25 NOTE — Telephone Encounter (Signed)
Overutilization of potentially unnecessary surgical interventions for lesions with benign pathology with increased health care costs, increased risk of infection and complications is not recommended by me. It appears that [facility] imaging providers may have discussed options with Valerie Manning and she would like to consult with the surgeon so I have placed the referral to respect her autonomy to discuss further treatment interventions.

## 2015-06-26 ENCOUNTER — Encounter: Payer: Self-pay | Admitting: *Deleted

## 2015-07-05 ENCOUNTER — Ambulatory Visit (INDEPENDENT_AMBULATORY_CARE_PROVIDER_SITE_OTHER): Payer: Federal, State, Local not specified - PPO | Admitting: General Surgery

## 2015-07-05 ENCOUNTER — Encounter: Payer: Self-pay | Admitting: General Surgery

## 2015-07-05 VITALS — BP 146/86 | HR 84 | Resp 12 | Ht 60.0 in | Wt 165.0 lb

## 2015-07-05 DIAGNOSIS — R928 Other abnormal and inconclusive findings on diagnostic imaging of breast: Secondary | ICD-10-CM | POA: Diagnosis not present

## 2015-07-05 NOTE — Patient Instructions (Signed)
Follow up appointment to be announced.  

## 2015-07-05 NOTE — Progress Notes (Addendum)
Patient ID: Valerie Manning, female   DOB: 1965/11/10, 50 y.o.   MRN: [id number]  Chief Complaint  Patient presents with  . Other    right breast mass    HPI Valerie Manning is a 50 y.o. female who presents for a breast evaluation. The patient noted soreness in the area areolar area of both breasts the month prior to her recent mammogram, and in part this prompted her to have her annual mammogram at this time. She otherwise appreciated no change in the color or contour of the nipple/areolar complex. She denied any drainage. There is no history of trauma.   The most recent mammogram was done on 06/02/15 and right breast biopsy done on 06/09/15 @[facility] . The patient subsequently had a second biopsy completed in [place] on 06/22/2015 Patient does perform regular self breast checks and gets regular mammograms done   I personally reviewed the patient history.  HPI  Past Medical History  Diagnosis Date  . Diabetes mellitus without complication ([place])   . Hypertension   . Hyperlipidemia     Past Surgical History  Procedure Laterality Date  . Abdominal hysterectomy      Family History  Problem Relation Age of Onset  . Diabetes Mother   . Diabetes Sister   . Diabetes Sister   . Diabetes Sister   . Thyroid disease Sister   . Diabetes Sister   . Thyroid disease Sister   . Thyroid disease Sister     Social History Social History  Substance Use Topics  . Smoking status: Never Smoker   . Smokeless tobacco: None  . Alcohol Use: No    Allergies  Allergen Reactions  . Metformin And Related Diarrhea  . Metformin Hcl Diarrhea    Current Outpatient Prescriptions  Medication Sig Dispense Refill  . aspirin EC 81 MG tablet Take by mouth.    Marland Kitchen atorvastatin (LIPITOR) 20 MG tablet take 1 tablet by mouth once daily 30 tablet 1  . doxycycline (VIBRAMYCIN) 100 MG capsule Reported on 06/21/2015  0  . glimepiride (AMARYL) 4 MG tablet Take 1 tablet (4 mg total) by mouth daily before breakfast. 90  tablet 0  . glucose blood (ONE TOUCH TEST STRIPS) test strip Test BS twice daily  DX E11.9 100 each 12  . Multiple Vitamin (MULTIVITAMIN) tablet Take 1 tablet by mouth daily.    Marland Kitchen omeprazole (PRILOSEC) 20 MG capsule take 1 capsule by mouth once daily 30 capsule 1  . SYNTHROID 175 MCG tablet   0  . valsartan-hydrochlorothiazide (DIOVAN-HCT) 160-12.5 MG tablet take 1 tablet once daily 30 tablet 5   No current facility-administered medications for this visit.    Review of Systems Review of Systems  Constitutional: Negative.   Respiratory: Negative.   Cardiovascular: Negative.     Blood pressure 146/86, pulse 84, resp. rate 12, height 5' (1.524 m), weight 165 lb (74.844 kg).  Physical Exam Physical Exam  Constitutional: She is oriented to person, place, and time. She appears well-developed and well-nourished.  Eyes: Conjunctivae are normal. No scleral icterus.  Neck: Neck supple.  Cardiovascular: Normal rate, regular rhythm and normal heart sounds.   Pulmonary/Chest: Effort normal and breath sounds normal. Right breast exhibits no inverted nipple, no mass, no nipple discharge, no skin change and no tenderness. Left breast exhibits no inverted nipple, no mass, no nipple discharge, no skin change and no tenderness.  Abdominal: Soft. Bowel sounds are normal. There is no tenderness.  Lymphadenopathy:    She has  no cervical adenopathy.    She has no axillary adenopathy.  Neurological: She is alert and oriented to person, place, and time.  Skin: Skin is warm and dry.    Data Reviewed Bilateral mammograms dated 06/02/2015 suggested a low density mass in the medial right breast and distortion of the lateral inferior right breast. Subsequent ultrasound showed the medial lesion represent a cluster of cysts. There was no irregular mass at the 8:00 position 2 cm the nipple measuring up to 11 mm with posterior shadowing. BI-RADS-5.  The patient underwent ultrasound-guided biopsy of this lesion on  06/09/2015. Post biopsy imaging reported the tissue marker clip or spine and to the area at the 8:00 position 2 cm from nipple but on the toe most symphysis views a second area posterior to this was noted and a second biopsy recommended. The patient subsequently underwent a toe most symphysis guided biopsy of the right breast on 06/22/2015 with post biopsy imaging reporting the clip deployed in the area of radiologic concern.    The first biopsy showed benign breast tissue with fibrocystic changes.  The second biopsy showed stromal fibrosis with atrophic benign glands in usual ductal hyperplasia. Fibroadenoma and fibrocystic changes with associated calcifications. Microscopic comment included the differential diagnosis including previous biopsy site changes versus a complex sclerosing lesion.    Assessment    Benign breast exam. Benign breast biopsy 2.    Plan    Without findings of atypical hyperplasia on either biopsy, there is no indication for excision even if a complex sclerosing/radial scar lesion is present.[name]: [phone]).  The patient may benefit from a follow-up 6 month mammogram to reestablish a new baseline now that she is undergone 2 prior biopsies.  An effort will be made to obtain her post biopsy imaging from [facility] as they are not available to me on the [place] image retrieval system.      PCP:  Ashok Norris This information has been scribed by Gaspar Cola CMA.   [name] 07/05/2015, 8:54 PM   Postbiopsy images from 06/22/2015) reviewed. 2 separate locations within the left breast of valve in sample. The second biopsy site is well away from the original biopsy site. If indeed this represents a radial scar/complex sclerosing lesion, no additional surgery is indicated at this time. The patient will be encouraged to have a 6 month follow-up right breast mammogram completed. Office visit to follow.

## 2015-07-06 ENCOUNTER — Telehealth: Payer: Self-pay | Admitting: *Deleted

## 2015-07-06 NOTE — Telephone Encounter (Signed)
-----   Message from [name], MD sent at 07/06/2015  9:55 AM EST ----- Thank you, that's fine.  Please notify the patient I reviewed the mammograms. My only recommendation would be a 6 month follow-up of the right breast. ----- Message -----    From: Dominga Ferry, CMA    Sent: 07/06/2015   8:48 AM      To: [name], MD  Just want to clarify with you as I am a little puzzled by this request. The images post biopsy are in EPIC- go to chart review, imaging, then select 06-22-15 MM diag breast tomo uni right. If you clicked on the first DOS for 06-22-15 the images will not pull up. Please let me know if this is not what you are wanting. Thanks. ----- Message -----    From: [name], MD    Sent: 07/05/2015   8:52 PM      To: Dominga Ferry, CMA  Please contact mammography and see if they can have the postbiopsy images completed at the [facility] on 06/22/2015 imported into the [facility] system. If not we'll need to have the breast center send a disc with those images to the office. Thank you

## 2015-07-06 NOTE — Telephone Encounter (Signed)
Patient notified as instructed and she verbalizes understanding.  

## 2015-07-06 NOTE — Telephone Encounter (Signed)
Message left on home and cell numbers for patient to call the office.  

## 2015-07-09 ENCOUNTER — Other Ambulatory Visit: Payer: Self-pay | Admitting: Family Medicine

## 2015-07-13 ENCOUNTER — Ambulatory Visit: Payer: Federal, State, Local not specified - PPO | Admitting: Family Medicine

## 2015-07-14 ENCOUNTER — Other Ambulatory Visit: Payer: Self-pay | Admitting: Family Medicine

## 2015-08-08 ENCOUNTER — Other Ambulatory Visit: Payer: Self-pay | Admitting: Family Medicine

## 2015-08-30 ENCOUNTER — Ambulatory Visit: Payer: Federal, State, Local not specified - PPO | Admitting: Family Medicine

## 2015-09-05 ENCOUNTER — Other Ambulatory Visit: Payer: Self-pay | Admitting: Family Medicine

## 2015-10-04 ENCOUNTER — Encounter: Payer: Self-pay | Admitting: Obstetrics and Gynecology

## 2015-10-07 ENCOUNTER — Other Ambulatory Visit: Payer: Self-pay | Admitting: Family Medicine

## 2015-10-13 ENCOUNTER — Other Ambulatory Visit: Payer: Self-pay | Admitting: Family Medicine

## 2015-10-17 ENCOUNTER — Other Ambulatory Visit: Payer: Self-pay | Admitting: Family Medicine

## 2015-10-19 ENCOUNTER — Ambulatory Visit (INDEPENDENT_AMBULATORY_CARE_PROVIDER_SITE_OTHER): Payer: Federal, State, Local not specified - PPO | Admitting: Family Medicine

## 2015-10-19 ENCOUNTER — Encounter: Payer: Self-pay | Admitting: Family Medicine

## 2015-10-19 VITALS — BP 137/76 | HR 76 | Temp 98.0°F | Resp 15 | Ht 60.0 in | Wt 171.6 lb

## 2015-10-19 DIAGNOSIS — E119 Type 2 diabetes mellitus without complications: Secondary | ICD-10-CM | POA: Diagnosis not present

## 2015-10-19 DIAGNOSIS — E785 Hyperlipidemia, unspecified: Secondary | ICD-10-CM

## 2015-10-19 LAB — GLUCOSE, POCT (MANUAL RESULT ENTRY): POC GLUCOSE: 174 mg/dL — AB (ref 70–99)

## 2015-10-19 LAB — POCT GLYCOSYLATED HEMOGLOBIN (HGB A1C): HEMOGLOBIN A1C: 7.9

## 2015-10-19 MED ORDER — ATORVASTATIN CALCIUM 20 MG PO TABS: 20.0000 mg | ORAL_TABLET | Freq: Every day | ORAL | Status: DC

## 2015-10-19 MED ORDER — SITAGLIPTIN PHOS-METFORMIN HCL 50-500 MG PO TABS: 1.0000 | ORAL_TABLET | Freq: Two times a day (BID) | ORAL | Status: DC

## 2015-10-19 NOTE — Progress Notes (Signed)
Name: Valerie Manning   MRN: [id number]    DOB: Oct 30, 1965   Date:10/19/2015       Progress Note  Subjective  Chief Complaint  Chief Complaint  Patient presents with  . Medication Refill  This patient is usually followed by Dr. Rutherford Nail, new to me.   Diabetes She presents for her follow-up diabetic visit. She has type 2 diabetes mellitus. Her disease course has been worsening. There are no hypoglycemic associated symptoms. Pertinent negatives for diabetes include no fatigue, no polydipsia and no polyuria. Pertinent negatives for diabetic complications include no CVA, heart disease or peripheral neuropathy. Current diabetic treatment includes oral agent (monotherapy). She participates in exercise daily. Her lunch blood glucose range is generally 130-140 mg/dl. An ACE inhibitor/angiotensin II receptor blocker is being taken.  Hyperlipidemia This is a chronic problem. The problem is controlled. Recent lipid tests were reviewed and are normal. Exacerbating diseases include diabetes. Pertinent negatives include no leg pain, myalgias or shortness of breath. Current antihyperlipidemic treatment includes statins.     Past Medical History  Diagnosis Date  . Diabetes mellitus without complication ([place])   . Hypertension   . Hyperlipidemia     Past Surgical History  Procedure Laterality Date  . Abdominal hysterectomy      Family History  Problem Relation Age of Onset  . Diabetes Mother   . Diabetes Sister   . Diabetes Sister   . Diabetes Sister   . Thyroid disease Sister   . Diabetes Sister   . Thyroid disease Sister   . Thyroid disease Sister     Social History   Social History  . Marital Status: Married    Spouse Name: N/A  . Number of Children: N/A  . Years of Education: N/A   Occupational History  . Not on file.   Social History Main Topics  . Smoking status: Never Smoker   . Smokeless tobacco: Not on file  . Alcohol Use: No  . Drug Use: No  . Sexual Activity: Not on  file   Other Topics Concern  . Not on file   Social History Narrative     Current outpatient prescriptions:  .  aspirin EC 81 MG tablet, Take by mouth., Disp: , Rfl:  .  atorvastatin (LIPITOR) 20 MG tablet, take 1 tablet by mouth once daily, Disp: 30 tablet, Rfl: 1 .  glucose blood (ONE TOUCH TEST STRIPS) test strip, Test BS twice daily  DX E11.9, Disp: 100 each, Rfl: 12 .  Multiple Vitamin (MULTIVITAMIN) tablet, Take 1 tablet by mouth daily., Disp: , Rfl:  .  omeprazole (PRILOSEC) 20 MG capsule, take 1 capsule by mouth once daily, Disp: 30 capsule, Rfl: 1 .  SYNTHROID 175 MCG tablet, , Disp: , Rfl: 0 .  valsartan-hydrochlorothiazide (DIOVAN-HCT) 160-12.5 MG tablet, take 1 tablet once daily, Disp: 30 tablet, Rfl: 5  No Active Allergies   Review of Systems  Constitutional: Negative for fatigue.  Respiratory: Negative for shortness of breath.   Musculoskeletal: Negative for myalgias.  Endo/Heme/Allergies: Negative for polydipsia.    Objective  Filed Vitals:   10/19/15 1411  BP: 137/76  Pulse: 76  Temp: 98 F (36.7 C)  TempSrc: Oral  Resp: 15  Height: 5' (1.524 m)  Weight: 171 lb 9.6 oz (77.837 kg)  SpO2: 96%    Physical Exam  Constitutional: She is oriented to person, place, and time and well-developed, well-nourished, and in no distress.  Cardiovascular: Normal rate, regular rhythm, S1 normal, S2 normal and  normal heart sounds.   No murmur heard. Pulmonary/Chest: Effort normal and breath sounds normal. She has no wheezes. She has no rhonchi.  Abdominal: Soft. Bowel sounds are normal. There is no tenderness.  Musculoskeletal:       Right ankle: She exhibits no swelling.       Left ankle: She exhibits no swelling.  Neurological: She is alert and oriented to person, place, and time.  Nursing note and vitals reviewed.      Assessment & Plan  1. Type 2 diabetes mellitus without complication, without long-term current use of insulin (HCC) A1c 7.9%, consistent  with poorly controlled diabetes. DC glimepiride and started on Janumet 50-500 milligrams twice daily, advised to contact us if she experiences any GI side effects as patient has had with metformin before. Repeat A1c in 3-4 months - sitaGLIPtin-metformin (JANUMET) 50-500 MG tablet; Take 1 tablet by mouth 2 (two) times daily with a meal.  Dispense: 180 tablet; Refill: 0 - POCT HgB A1C - POCT Glucose (CBG)  2. HLD (hyperlipidemia) Obtain FLP, refills for Atorvastatin are provided  - Lipid Profile - Comprehensive Metabolic Panel (CMET) - atorvastatin (LIPITOR) 20 MG tablet; Take 1 tablet (20 mg total) by mouth daily.  Dispense: 90 tablet; Refill: 1  Marnita Poirier [facility] 10/19/2015 2:41 PM

## 2015-10-20 ENCOUNTER — Telehealth: Payer: Self-pay | Admitting: Family Medicine

## 2015-10-20 ENCOUNTER — Other Ambulatory Visit
Admission: RE | Admit: 2015-10-20 | Discharge: 2015-10-20 | Disposition: A | Discharge status: Home / Self Care | Payer: Federal, State, Local not specified - PPO | Source: Ambulatory Visit | Attending: Family Medicine | Admitting: Family Medicine

## 2015-10-20 DIAGNOSIS — E785 Hyperlipidemia, unspecified: Secondary | ICD-10-CM | POA: Insufficient documentation

## 2015-10-20 LAB — LIPID PANEL
Cholesterol: 141 mg/dL (ref 0–200)
HDL: 64 mg/dL (ref >40)
LDL Cholesterol: 57 mg/dL (ref 0–99)
TRIGLYCERIDES: 102 mg/dL (ref <150)
Total CHOL/HDL Ratio: 2.2 RATIO
VLDL: 20 mg/dL (ref 0–40)

## 2015-10-20 LAB — COMPREHENSIVE METABOLIC PANEL
ALK PHOS: 78 U/L (ref 38–126)
ALT: 35 U/L (ref 14–54)
AST: 25 U/L (ref 15–41)
Albumin: 4.1 g/dL (ref 3.5–5.0)
Anion gap: 8 (ref 5–15)
BUN: 14 mg/dL (ref 6–20)
CALCIUM: 9 mg/dL (ref 8.9–10.3)
CO2: 27 mmol/L (ref 22–32)
CREATININE: 0.64 mg/dL (ref 0.44–1.00)
Chloride: 101 mmol/L (ref 101–111)
Glucose, Bld: 240 mg/dL — ABNORMAL HIGH (ref 65–99)
Potassium: 3.6 mmol/L (ref 3.5–5.1)
SODIUM: 136 mmol/L (ref 135–145)
Total Bilirubin: 0.9 mg/dL (ref 0.3–1.2)
Total Protein: 7.5 g/dL (ref 6.5–8.1)

## 2015-10-20 NOTE — Telephone Encounter (Signed)
ERRENOUS °

## 2015-10-25 ENCOUNTER — Encounter: Payer: Self-pay | Admitting: Obstetrics and Gynecology

## 2015-10-25 ENCOUNTER — Ambulatory Visit (INDEPENDENT_AMBULATORY_CARE_PROVIDER_SITE_OTHER): Payer: Federal, State, Local not specified - PPO | Admitting: Obstetrics and Gynecology

## 2015-10-25 ENCOUNTER — Telehealth: Payer: Self-pay | Admitting: Family Medicine

## 2015-10-25 VITALS — BP 144/84 | HR 67 | Ht 64.0 in | Wt 170.4 lb

## 2015-10-25 DIAGNOSIS — Z90721 Acquired absence of ovaries, unilateral: Secondary | ICD-10-CM | POA: Diagnosis not present

## 2015-10-25 DIAGNOSIS — Z1211 Encounter for screening for malignant neoplasm of colon: Secondary | ICD-10-CM

## 2015-10-25 DIAGNOSIS — Z0000 Encounter for general adult medical examination without abnormal findings: Secondary | ICD-10-CM | POA: Diagnosis not present

## 2015-10-25 DIAGNOSIS — Z01419 Encounter for gynecological examination (general) (routine) without abnormal findings: Secondary | ICD-10-CM

## 2015-10-25 DIAGNOSIS — R638 Other symptoms and signs concerning food and fluid intake: Secondary | ICD-10-CM | POA: Diagnosis not present

## 2015-10-25 DIAGNOSIS — Z90710 Acquired absence of both cervix and uterus: Secondary | ICD-10-CM | POA: Insufficient documentation

## 2015-10-25 DIAGNOSIS — Z9079 Acquired absence of other genital organ(s): Secondary | ICD-10-CM

## 2015-10-25 NOTE — Telephone Encounter (Signed)
Patient was prescribed Janument and it was not affordable ($100). It was suggested that we prescribe alogliptin metformin. Asking that you send a 30 day supply to Valerie Manning. Also wanted to know that since she is not able to get this prescription today due to doctor not being in the office, if she is able to take she other medication.

## 2015-10-25 NOTE — Telephone Encounter (Signed)
I spoke with patient; she did just get the medicine; after she uses these up, she'll need to pay $100 out of her pocket; she currently has medicine so she is thinking forward to next month No excessive dry mouth or thirst; I reviewed her creatinine, TG; I advised her okay to start this medicine; we'll leave for Dr. Manuella Ghazi to review to see if he wants to switch to recommended med below for next month

## 2015-10-25 NOTE — Patient Instructions (Signed)
1. No Pap smear needed. 2. Mammogram to be scheduled per Dr. Barb[facility] 3. Stool guaiac cards are given for colon cancer screening 4. Continue with calcium and vitamin D supplementation 5. Continue with healthy eating and exercise with weight loss 6. Encourage activity such as yoga and Pilates for core muscle strengthening 7. Return in 1 year

## 2015-10-25 NOTE — Progress Notes (Signed)
Patient ID: Valerie Manning, female   DOB: 1965/12/21, 50 y.o.   MRN: [id number] ANNUAL PREVENTATIVE CARE GYN  ENCOUNTER NOTE  Subjective:       Valerie Manning is a 50 y.o. G2P0 female here for a routine annual gynecologic exam.  Current complaints: 1.  S/p TAH LSO R salpingectomy; denies vaginal dryness, night sweats, and hot flashes. 2. Abnormal mammogram-biopsy performed of the right breast; being followed by Dr. Bary Castilla   Gynecologic History No LMP recorded. Patient has had a hysterectomy. Contraception: status post hysterectomy Last Pap: n/a. Results were: n/a Last mammogram: 2017. Results were: abnormal  Obstetric History OB History  Gravida Para Term Preterm AB SAB TAB Ectopic Multiple Living  2         2    # Outcome Date GA Lbr Len/2nd Weight Sex Delivery Anes PTL Lv  2 Gravida           1 Gravida             Obstetric Comments  1st Menstrual Cycle:  10  1st Pregnancy:      Past Medical History  Diagnosis Date  . Diabetes mellitus without complication ([place])   . Hypertension   . Hyperlipidemia     Past Surgical History  Procedure Laterality Date  . Abdominal hysterectomy    . Breast biopsy      x2- neg    Current Outpatient Prescriptions on File Prior to Visit  Medication Sig Dispense Refill  . aspirin EC 81 MG tablet Take by mouth.    Marland Kitchen atorvastatin (LIPITOR) 20 MG tablet Take 1 tablet (20 mg total) by mouth daily. 90 tablet 1  . glucose blood (ONE TOUCH TEST STRIPS) test strip Test BS twice daily  DX E11.9 100 each 12  . Multiple Vitamin (MULTIVITAMIN) tablet Take 1 tablet by mouth daily.    Marland Kitchen omeprazole (PRILOSEC) 20 MG capsule take 1 capsule by mouth once daily 30 capsule 1  . SYNTHROID 175 MCG tablet   0  . valsartan-hydrochlorothiazide (DIOVAN-HCT) 160-12.5 MG tablet take 1 tablet once daily 30 tablet 5   No current facility-administered medications on file prior to visit.    No Known Allergies  Social History   Social History  . Marital Status:  Married    Spouse Name: N/A  . Number of Children: N/A  . Years of Education: N/A   Occupational History  . Not on file.   Social History Main Topics  . Smoking status: Never Smoker   . Smokeless tobacco: Not on file  . Alcohol Use: No  . Drug Use: No  . Sexual Activity: Yes    Birth Control/ Protection: Surgical   Other Topics Concern  . Not on file   Social History Narrative    Family History  Problem Relation Age of Onset  . Diabetes Mother   . Diabetes Sister   . Heart disease Sister   . Diabetes Sister   . Diabetes Sister   . Thyroid disease Sister   . Diabetes Sister   . Thyroid disease Sister   . Thyroid disease Sister   . Heart disease Father   . Cancer Neg Hx     The following portions of the patient's history were reviewed and updated as appropriate: allergies, current medications, past family history, past medical history, past social history, past surgical history and problem list.  Review of Systems ROS Review of Systems - General ROS: negative for - chills, fatigue, fever, hot  flashes, night sweats, weight gain or weight loss Psychological ROS: negative for - anxiety, decreased libido, depression, mood swings, physical abuse or sexual abuse Ophthalmic ROS: negative for - blurry vision, eye pain or loss of vision ENT ROS: negative for - headaches, hearing change, visual changes or vocal changes Allergy and Immunology ROS: negative for - hives, itchy/watery eyes or seasonal allergies Hematological and Lymphatic ROS: negative for - bleeding problems, bruising, swollen lymph nodes or weight loss Endocrine ROS: negative for - galactorrhea, hair pattern changes, hot flashes, malaise/lethargy, mood swings, palpitations, polydipsia/polyuria, skin changes, temperature intolerance or unexpected weight changes Breast ROS: negative for - new or changing breast lumps or nipple discharge Respiratory ROS: negative for - cough or shortness of breath Cardiovascular ROS:  negative for - chest pain, irregular heartbeat, palpitations or shortness of breath Gastrointestinal ROS: no abdominal pain, change in bowel habits, or black or bloody stools Genito-Urinary ROS: no dysuria, trouble voiding, or hematuria Musculoskeletal ROS: negative for - joint pain or joint stiffness Neurological ROS: negative for - bowel and bladder control changes Dermatological ROS: negative for rash and skin lesion changes   Objective:   BP 144/84 mmHg  Pulse 67  Ht 5\' 4"  (1.626 m)  Wt 170 lb 6.4 oz (77.293 kg)  BMI 29.23 kg/m2 CONSTITUTIONAL: Well-developed, well-nourished female in no acute distress.  PSYCHIATRIC: Normal mood and affect. Normal behavior. Normal judgment and thought content. [place]: Alert and oriented to person, place, and time. Normal muscle tone coordination. No cranial nerve deficit noted. HENT:  Normocephalic, atraumatic, External right and left ear normal. Oropharynx is clear and moist EYES: Conjunctivae and EOM are normal. No scleral icterus.  NECK: Normal range of motion, supple, no masses.  Normal thyroid.  SKIN: Skin is warm and dry. No rash noted. Not diaphoretic. No erythema. No pallor. CARDIOVASCULAR: Normal heart rate noted, regular rhythm, no murmur. RESPIRATORY: Clear to auscultation bilaterally. Effort and breath sounds normal, no problems with respiration noted. BREASTS: Symmetric in size. No masses, skin changes, nipple drainage, or lymphadenopathy. Small biopsy scar noted on right breast. ABDOMEN: Soft, normal bowel sounds, no distention noted.  No tenderness, rebound or guarding.  BLADDER: Normal PELVIC:  External Genitalia: Normal  BUS: Normal  Vagina: Normal, secretions adequate  Cervix: Surgically absent  Uterus: Surgically absent  Adnexa: Normal, nonpalpable, nontender  RV: External Exam NormaI, No Rectal Masses and Normal Sphincter tone  MUSCULOSKELETAL: Normal range of motion. No tenderness.  No cyanosis, clubbing, or edema.  2+  distal pulses. LYMPHATIC: No Axillary, Supraclavicular, or Inguinal Adenopathy.    Assessment:   Annual gynecologic examination 50 y.o. Contraception: status post hysterectomy [id number] Problem List Items Addressed This Visit    None      Plan:  Pap: Not needed Mammogram: thru dr brynett Stool Guaiac Testing:  Ordered Labs: thru pcp Routine preventative health maintenance measures emphasized: Exercise/Diet/Weight control, Tobacco Warnings and Alcohol/Substance use risks  1. Return to [place] 2. Will defer follow-up mammorgrams to Dr. Bary Castilla 3. Advised pilates and core exercises for weight loss  Joyice Faster, CMA Arlyss Gandy, PA-S Brayton Mars, MD   I have seen, interviewed, and examined the patient in conjunction with the [facility].A. student and affirm the diagnosis and management plan. Glorianna Gott A. Haylen Bellotti, MD, FACOG   Note: This dictation was prepared with Dragon dictation along with smaller phrase technology. Any transcriptional errors that result from this process are unintentional.

## 2015-10-26 ENCOUNTER — Encounter: Payer: Self-pay | Admitting: *Deleted

## 2015-10-26 NOTE — Telephone Encounter (Signed)
Patient called to inform that the medication that she took yesterday and this morning is working really well. At this moment she is not having any side effects. She will call back with the mail order company that she would like the prescription sent to.

## 2015-10-27 NOTE — Telephone Encounter (Signed)
Left a Voice message for patient with instructions to return in one month and we'll discuss changing to the preferred alternative.

## 2015-10-31 LAB — FECAL OCCULT BLOOD, IMMUNOCHEMICAL: Fecal Occult Bld: NEGATIVE

## 2015-11-07 ENCOUNTER — Other Ambulatory Visit: Payer: Self-pay | Admitting: Family Medicine

## 2015-11-21 ENCOUNTER — Other Ambulatory Visit: Payer: Self-pay | Admitting: General Surgery

## 2015-11-21 DIAGNOSIS — R928 Other abnormal and inconclusive findings on diagnostic imaging of breast: Secondary | ICD-10-CM

## 2015-12-05 ENCOUNTER — Encounter: Payer: Self-pay | Admitting: *Deleted

## 2015-12-08 ENCOUNTER — Other Ambulatory Visit: Payer: Self-pay | Admitting: Family Medicine

## 2015-12-18 ENCOUNTER — Other Ambulatory Visit: Payer: Self-pay | Admitting: Emergency Medicine

## 2015-12-18 MED ORDER — OMEPRAZOLE 20 MG PO CPDR: 20.0000 mg | DELAYED_RELEASE_CAPSULE | Freq: Every day | ORAL | 1 refills | Status: DC

## 2016-01-02 ENCOUNTER — Encounter: Payer: Self-pay | Admitting: *Deleted

## 2016-01-03 ENCOUNTER — Ambulatory Visit
Admission: RE | Admit: 2016-01-03 | Discharge: 2016-01-03 | Disposition: A | Discharge status: Home / Self Care | Payer: Federal, State, Local not specified - PPO | Source: Ambulatory Visit | Attending: General Surgery | Admitting: General Surgery

## 2016-01-03 ENCOUNTER — Other Ambulatory Visit: Payer: Self-pay | Admitting: General Surgery

## 2016-01-03 DIAGNOSIS — R928 Other abnormal and inconclusive findings on diagnostic imaging of breast: Secondary | ICD-10-CM | POA: Diagnosis not present

## 2016-01-08 ENCOUNTER — Encounter: Payer: Self-pay | Admitting: General Surgery

## 2016-01-08 ENCOUNTER — Ambulatory Visit (INDEPENDENT_AMBULATORY_CARE_PROVIDER_SITE_OTHER): Payer: Federal, State, Local not specified - PPO | Admitting: General Surgery

## 2016-01-08 VITALS — BP 126/70 | HR 74 | Resp 12 | Ht 64.0 in | Wt 168.0 lb

## 2016-01-08 DIAGNOSIS — R928 Other abnormal and inconclusive findings on diagnostic imaging of breast: Secondary | ICD-10-CM

## 2016-01-08 NOTE — Progress Notes (Signed)
Patient ID: Valerie Manning, female   DOB: 26-Sep-1965, 50 y.o.   MRN: [id number]  Chief Complaint  Patient presents with  . Follow-up    Mammogram    HPI Valerie Manning is a 50 y.o. female who presents for a breast evaluation. The most recent mammogram was done on 01/03/16. Patient does perform regular self breast checks and gets regular mammograms done. She states no new breast issues. Her husband Koula Russo and her granddaughter Johney Maine is present.   HPI  Past Medical History:  Diagnosis Date  . Diabetes mellitus without complication ([place])   . Hyperlipidemia   . Hypertension     Past Surgical History:  Procedure Laterality Date  . ABDOMINAL HYSTERECTOMY    . BREAST BIOPSY     x2- neg    Family History  Problem Relation Age of Onset  . Diabetes Mother   . Diabetes Sister   . Heart disease Sister   . Diabetes Sister   . Diabetes Sister   . Thyroid disease Sister   . Diabetes Sister   . Thyroid disease Sister   . Thyroid disease Sister   . Heart disease Father   . Cancer Neg Hx   . Breast cancer Neg Hx     Social History Social History  Substance Use Topics  . Smoking status: Never Smoker  . Smokeless tobacco: Not on file  . Alcohol use No    No Known Allergies  Current Outpatient Prescriptions  Medication Sig Dispense Refill  . aspirin EC 81 MG tablet Take by mouth.    Marland Kitchen atorvastatin (LIPITOR) 20 MG tablet Take 1 tablet (20 mg total) by mouth daily. 90 tablet 1  . glimepiride (AMARYL) 4 MG tablet take 1 tablet once daily BEFORE BREAKFAST  0  . glucose blood (ONE TOUCH TEST STRIPS) test strip Test BS twice daily  DX E11.9 100 each 12  . Multiple Vitamin (MULTIVITAMIN) tablet Take 1 tablet by mouth daily.    Marland Kitchen omeprazole (PRILOSEC) 20 MG capsule Take 1 capsule (20 mg total) by mouth daily. 30 capsule 1  . SYNTHROID 175 MCG tablet   0  . valsartan-hydrochlorothiazide (DIOVAN-HCT) 160-12.5 MG tablet take 1 tablet by mouth once daily 30 tablet 5  . metFORMIN  (GLUCOPHAGE) 500 MG tablet Take 500 mg by mouth 2 (two) times daily with a meal.  0   No current facility-administered medications for this visit.     Review of Systems Review of Systems  Constitutional: Negative.   Respiratory: Negative.   Cardiovascular: Negative.     Blood pressure 126/70, pulse 74, resp. rate 12, height 5\' 4"  (1.626 m), weight 168 lb (76.2 kg).  Physical Exam Physical Exam  Constitutional: She is oriented to person, place, and time. She appears well-developed and well-nourished.  Eyes: Conjunctivae are normal. No scleral icterus.  Neck: Neck supple. No thyromegaly present.  Cardiovascular: Normal rate, regular rhythm and normal heart sounds.   Pulmonary/Chest: Effort normal and breath sounds normal. Right breast exhibits no inverted nipple, no mass, no nipple discharge, no skin change and no tenderness. Left breast exhibits no inverted nipple, no mass, no nipple discharge, no skin change and no tenderness.    No visible or palpable abnormality of the breast. Volume is symmetric.  Lymphadenopathy:    She has no cervical adenopathy.    She has no axillary adenopathy.  Neurological: She is alert and oriented to person, place, and time.  Skin: Skin is warm and dry.  Data Reviewed 06/09/2015:   RIGHT BREAST, 8:00, 2 CM FROM NIPPLE; BIOPSY:  - BENIGN BREAST TISSUE WITH FIBROCYSTIC CHANGE.   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 SEE COMMENT USUAL DUCTAL HYPERPLASIA FIBROADENOMA AND FIBROCYSTIC CHANGES WITH ASSOCIATED CALCIFICATION Microscopic Comment The differential diagnosis includes previous biopsy sites changes versus complex sclerosing lesions.  01/03/2016 right breast, synthesis mammogram reviewed. Persistent distortion in area of previous site of stromal fibrosis biopsy marker clip now 3 cm from the site of distortion. BI-RADS-4.  June 26, 2015 post biopsy imaging  reported biopsy clip deployed in the biopsy cavity and the base by reported as concordant.   Assessment    Benign breast exam.  Indeterminate radiologic studies.    Plan    The patient is undergone 2 biopsies of the lower outer quadrant of the right breast for mammographic/ultrasound/3-D distortion. No malignancy or atypia has been identified.  The patient's Baker Janus model risk for breast cancer is 0.9% over the next 5 years, 7.3% lifetime.  As the initial clip location was found to be appropriate, it's likely that we are now seeing clip migration postbiopsy. If the original biopsy was indeed of the targeted area, the fact that there is persistent distortion does not want repeat biopsy.  I am concerned even if a 3-D time was synthesis localization was done, the chance of appropriately sampling the tissue now well away from the biopsy site is less likely to be successful.  Patient's options at present are for 1) ongoing observation versus 2) biopsy as recommended by the radiology service.  The patient and her husband's questions were answered.  At this time, we are both comfortable with continued observation.   Patient was advised to call with any questions or concerns. Patient will follow up in 6 months with right breast mammogram.   This information has been scribed by Verlene Mayer, CMA     [name] 01/08/2016, 12:27 PM

## 2016-01-08 NOTE — Patient Instructions (Addendum)
Continue self breast exams. Call office for any new breast issues or concerns. Follow up in 6 months with right breast mammogram.

## 2016-01-22 ENCOUNTER — Ambulatory Visit: Payer: Federal, State, Local not specified - PPO | Admitting: Family Medicine

## 2016-02-01 ENCOUNTER — Ambulatory Visit: Payer: Federal, State, Local not specified - PPO | Admitting: Family Medicine

## 2016-02-01 ENCOUNTER — Encounter: Payer: Self-pay | Admitting: Family Medicine

## 2016-02-01 ENCOUNTER — Ambulatory Visit (INDEPENDENT_AMBULATORY_CARE_PROVIDER_SITE_OTHER): Payer: Federal, State, Local not specified - PPO | Admitting: Family Medicine

## 2016-02-01 VITALS — BP 124/68 | HR 88 | Temp 98.3°F | Resp 16 | Ht 64.0 in | Wt 171.1 lb

## 2016-02-01 DIAGNOSIS — I10 Essential (primary) hypertension: Secondary | ICD-10-CM | POA: Diagnosis not present

## 2016-02-01 DIAGNOSIS — E785 Hyperlipidemia, unspecified: Secondary | ICD-10-CM

## 2016-02-01 DIAGNOSIS — D170 Benign lipomatous neoplasm of skin and subcutaneous tissue of head, face and neck: Secondary | ICD-10-CM | POA: Diagnosis not present

## 2016-02-01 DIAGNOSIS — E119 Type 2 diabetes mellitus without complications: Secondary | ICD-10-CM | POA: Diagnosis not present

## 2016-02-01 LAB — POCT GLYCOSYLATED HEMOGLOBIN (HGB A1C): Hemoglobin A1C: 6.1

## 2016-02-01 LAB — GLUCOSE, POCT (MANUAL RESULT ENTRY): POC GLUCOSE: 123 mg/dL — AB (ref 70–99)

## 2016-02-01 NOTE — Progress Notes (Signed)
Name: Valerie Manning   MRN: [id number]    DOB: Mar 17, 1966   Date:02/01/2016       Progress Note  Subjective  Chief Complaint  Chief Complaint  Patient presents with  . Hyperlipidemia    3 month follow up  . Hypertension  . Diabetes    Hyperlipidemia  This is a chronic problem. The problem is controlled. Recent lipid tests were reviewed and are normal. Pertinent negatives include no chest pain, leg pain, myalgias or shortness of breath. Current antihyperlipidemic treatment includes statins.  Hypertension  This is a chronic problem. The problem is controlled. Pertinent negatives include no blurred vision, chest pain, headaches, palpitations or shortness of breath. Past treatments include angiotensin blockers and diuretics. There is no history of kidney disease, CAD/MI or CVA.  Diabetes  She presents for her follow-up diabetic visit. She has type 2 diabetes mellitus. Her disease course has been improving. Pertinent negatives for hypoglycemia include no headaches. Pertinent negatives for diabetes include no blurred vision and no chest pain. Pertinent negatives for diabetic complications include no CVA. Current diabetic treatment includes oral agent (dual therapy) (Started on Metformin 500mg  BID and Glipizide 5mg  by Endocrinologist.). Her breakfast blood glucose range is generally 130-140 mg/dl.    Past Medical History:  Diagnosis Date  . Diabetes mellitus without complication ([place])   . Hyperlipidemia   . Hypertension     Past Surgical History:  Procedure Laterality Date  . ABDOMINAL HYSTERECTOMY    . BREAST BIOPSY     x2- neg    Family History  Problem Relation Age of Onset  . Diabetes Mother   . Diabetes Sister   . Heart disease Sister   . Diabetes Sister   . Diabetes Sister   . Thyroid disease Sister   . Diabetes Sister   . Thyroid disease Sister   . Thyroid disease Sister   . Heart disease Father   . Cancer Neg Hx   . Breast cancer Neg Hx     Social History   Social  History  . Marital status: Married    Spouse name: N/A  . Number of children: N/A  . Years of education: N/A   Occupational History  . Not on file.   Social History Main Topics  . Smoking status: Never Smoker  . Smokeless tobacco: Not on file  . Alcohol use No  . Drug use: No  . Sexual activity: Yes    Birth control/ protection: Surgical   Other Topics Concern  . Not on file   Social History Narrative  . No narrative on file     Current Outpatient Prescriptions:  .  aspirin EC 81 MG tablet, Take by mouth., Disp: , Rfl:  .  atorvastatin (LIPITOR) 20 MG tablet, Take 1 tablet (20 mg total) by mouth daily., Disp: 90 tablet, Rfl: 1 .  glimepiride (AMARYL) 4 MG tablet, take 1 tablet once daily BEFORE BREAKFAST, Disp: , Rfl: 0 .  glucose blood (ONE TOUCH TEST STRIPS) test strip, Test BS twice daily  DX E11.9, Disp: 100 each, Rfl: 12 .  metFORMIN (GLUCOPHAGE) 500 MG tablet, Take 500 mg by mouth 2 (two) times daily with a meal., Disp: , Rfl: 0 .  Multiple Vitamin (MULTIVITAMIN) tablet, Take 1 tablet by mouth daily., Disp: , Rfl:  .  omeprazole (PRILOSEC) 20 MG capsule, Take 1 capsule (20 mg total) by mouth daily., Disp: 30 capsule, Rfl: 1 .  SYNTHROID 175 MCG tablet, , Disp: , Rfl: 0 .  valsartan-hydrochlorothiazide (DIOVAN-HCT) 160-12.5 MG tablet, take 1 tablet by mouth once daily, Disp: 30 tablet, Rfl: 5 .  glipiZIDE (GLUCOTROL XL) 5 MG 24 hr tablet, , Disp: , Rfl: 0  No Known Allergies   Review of Systems  Constitutional: Negative for chills and fever.  Eyes: Negative for blurred vision.  Respiratory: Negative for shortness of breath.   Cardiovascular: Negative for chest pain and palpitations.  Gastrointestinal: Negative for abdominal pain, nausea and vomiting.  Musculoskeletal: Negative for myalgias.  Neurological: Negative for headaches.    Objective  Vitals:   02/01/16 1413  BP: 124/68  Pulse: 88  Resp: 16  Temp: 98.3 F (36.8 C)  TempSrc: Oral  SpO2: 94%    Weight: 171 lb 1.6 oz (77.6 kg)  Height: 5\' 4"  (1.626 m)    Physical Exam  Constitutional: She is oriented to person, place, and time and well-developed, well-nourished, and in no distress.  HENT:  Head: Normocephalic and atraumatic.  Cardiovascular: Normal rate, regular rhythm, S1 normal, S2 normal and normal heart sounds.   No murmur heard. Pulmonary/Chest: Effort normal and breath sounds normal. She has no wheezes. She has no rhonchi.  Abdominal: Soft. Bowel sounds are normal. There is no tenderness.  Musculoskeletal:       Right ankle: She exhibits no swelling.       Left ankle: She exhibits no swelling.       Back:  Soft mobile well-defined non tender mass at the right posterior neck.  Neurological: She is alert and oriented to person, place, and time.  Psychiatric: Mood, memory, affect and judgment normal.  Nursing note and vitals reviewed.    Assessment & Plan   1. Type 2 diabetes mellitus without complication, without long-term current use of insulin (HCC) Point-of-care A1c 6.1%, glucose is 123 mg/dL well controlled diabetes. - POCT HgB A1C - POCT Glucose (CBG)   2. Essential hypertension BP stable and controlled on present antihypertensive therapy  3. Hyperlipidemia, unspecified hyperlipidemia type FLP from June 2017 reviewed, total cholesterol, triglycerides, and LDL at goal. Continue on statin therapy  4. Lipoma of skin and subcutaneous tissue of neck Likely benign, obtain ultrasound for further evaluation - US Soft Tissue Head/Neck; Future   Tage Feggins Asad A. [facility] 02/01/2016 2:16 PM

## 2016-02-01 NOTE — Progress Notes (Signed)
23

## 2016-02-06 DIAGNOSIS — I158 Other secondary hypertension: Secondary | ICD-10-CM | POA: Diagnosis not present

## 2016-02-06 DIAGNOSIS — E781 Pure hyperglyceridemia: Secondary | ICD-10-CM | POA: Diagnosis not present

## 2016-02-06 DIAGNOSIS — E119 Type 2 diabetes mellitus without complications: Secondary | ICD-10-CM | POA: Diagnosis not present

## 2016-02-06 DIAGNOSIS — I10 Essential (primary) hypertension: Secondary | ICD-10-CM | POA: Diagnosis not present

## 2016-02-06 DIAGNOSIS — E032 Hypothyroidism due to medicaments and other exogenous substances: Secondary | ICD-10-CM | POA: Diagnosis not present

## 2016-02-06 DIAGNOSIS — E039 Hypothyroidism, unspecified: Secondary | ICD-10-CM | POA: Diagnosis not present

## 2016-02-08 ENCOUNTER — Ambulatory Visit
Admission: RE | Admit: 2016-02-08 | Discharge: 2016-02-08 | Disposition: A | Discharge status: Home / Self Care | Payer: Federal, State, Local not specified - PPO | Source: Ambulatory Visit | Attending: Family Medicine | Admitting: Family Medicine

## 2016-02-08 DIAGNOSIS — D170 Benign lipomatous neoplasm of skin and subcutaneous tissue of head, face and neck: Secondary | ICD-10-CM | POA: Insufficient documentation

## 2016-02-08 DIAGNOSIS — R221 Localized swelling, mass and lump, neck: Secondary | ICD-10-CM | POA: Diagnosis not present

## 2016-02-09 ENCOUNTER — Other Ambulatory Visit: Payer: Self-pay | Admitting: Family Medicine

## 2016-02-09 DIAGNOSIS — R221 Localized swelling, mass and lump, neck: Secondary | ICD-10-CM

## 2016-02-14 ENCOUNTER — Other Ambulatory Visit: Payer: Self-pay | Admitting: Family Medicine

## 2016-02-20 ENCOUNTER — Ambulatory Visit
Admission: RE | Admit: 2016-02-20 | Discharge: 2016-02-20 | Disposition: A | Discharge status: Home / Self Care | Payer: Federal, State, Local not specified - PPO | Source: Ambulatory Visit | Attending: Family Medicine | Admitting: Family Medicine

## 2016-02-20 DIAGNOSIS — J3489 Other specified disorders of nose and nasal sinuses: Secondary | ICD-10-CM | POA: Diagnosis not present

## 2016-02-20 DIAGNOSIS — R221 Localized swelling, mass and lump, neck: Secondary | ICD-10-CM

## 2016-02-20 DIAGNOSIS — D170 Benign lipomatous neoplasm of skin and subcutaneous tissue of head, face and neck: Secondary | ICD-10-CM | POA: Diagnosis not present

## 2016-02-20 LAB — POCT I-STAT CREATININE: CREATININE: 0.7 mg/dL (ref 0.44–1.00)

## 2016-02-20 MED ORDER — IOPAMIDOL (ISOVUE-300) INJECTION 61%
75.0000 mL | Freq: Once | INTRAVENOUS | Status: AC | PRN
  Administered 2016-02-20: 75 mL via INTRAVENOUS

## 2016-02-27 ENCOUNTER — Other Ambulatory Visit: Payer: Self-pay | Admitting: Family Medicine

## 2016-02-27 DIAGNOSIS — D170 Benign lipomatous neoplasm of skin and subcutaneous tissue of head, face and neck: Secondary | ICD-10-CM

## 2016-03-11 ENCOUNTER — Ambulatory Visit (INDEPENDENT_AMBULATORY_CARE_PROVIDER_SITE_OTHER): Payer: Federal, State, Local not specified - PPO | Admitting: General Surgery

## 2016-03-11 VITALS — BP 118/76 | HR 72 | Resp 12 | Ht 64.0 in | Wt 169.0 lb

## 2016-03-11 DIAGNOSIS — D170 Benign lipomatous neoplasm of skin and subcutaneous tissue of head, face and neck: Secondary | ICD-10-CM

## 2016-03-11 NOTE — Patient Instructions (Signed)
Follow up as needed. Call if you would like this area removed.

## 2016-03-11 NOTE — Progress Notes (Signed)
Patient ID: Valerie Manning, female   DOB: November 25, 1965, 50 y.o.   MRN: [id number]  Chief Complaint  Patient presents with  . Other    lipoma    HPI Valerie Manning is a 50 y.o. female here for an evaluation of a lipoma on the back of her neck. She has had this ultrasound and a CT in the past. They told her it was fatty tissue. She reports that she first noticed this about a month ago. She states it seems to have decreased in size. She denies any pain or discomfort with this. She is here today with her husband Valerie Manning.  HPI  Past Medical History:  Diagnosis Date  . Diabetes mellitus without complication ([place])   . Hyperlipidemia   . Hypertension     Past Surgical History:  Procedure Laterality Date  . ABDOMINAL HYSTERECTOMY    . BREAST BIOPSY     x2- neg    Family History  Problem Relation Age of Onset  . Diabetes Mother   . Diabetes Sister   . Heart disease Sister   . Diabetes Sister   . Diabetes Sister   . Thyroid disease Sister   . Diabetes Sister   . Thyroid disease Sister   . Thyroid disease Sister   . Heart disease Father   . Cancer Neg Hx   . Breast cancer Neg Hx     Social History Social History  Substance Use Topics  . Smoking status: Never Smoker  . Smokeless tobacco: Not on file  . Alcohol use No    No Known Allergies  Current Outpatient Prescriptions  Medication Sig Dispense Refill  . aspirin EC 81 MG tablet Take by mouth.    Marland Kitchen atorvastatin (LIPITOR) 20 MG tablet Take 1 tablet (20 mg total) by mouth daily. 90 tablet 1  . glipiZIDE (GLUCOTROL XL) 5 MG 24 hr tablet   0  . metFORMIN (GLUCOPHAGE) 500 MG tablet Take 500 mg by mouth 2 (two) times daily with a meal.  0  . Multiple Vitamin (MULTIVITAMIN) tablet Take 1 tablet by mouth daily.    Marland Kitchen omeprazole (PRILOSEC) 20 MG capsule take 1 capsule by mouth once daily 30 capsule 1  . SYNTHROID 175 MCG tablet   0  . valsartan-hydrochlorothiazide (DIOVAN-HCT) 160-12.5 MG tablet take 1 tablet by mouth once daily 30  tablet 5  . glucose blood (ONE TOUCH TEST STRIPS) test strip Test BS twice daily  DX E11.9 100 each 12   No current facility-administered medications for this visit.     Review of Systems Review of Systems  Constitutional: Negative.   Respiratory: Negative.   Cardiovascular: Negative.     Blood pressure 118/76, pulse 72, resp. rate 12, height 5\' 4"  (1.626 m), weight 169 lb (76.7 kg).  Physical Exam Physical Exam  Neck:    There is a 3 cm mass to the right of the midline at the bottom of the scalp.     Data Reviewed Neck ultrasound of 02/08/2016 and CT scan of the neck dated 02/20/2016 reviewed.  Assessment    Soft tissue lipoma, asymptomatic.    Plan    The patient is recently lost some weight, 16 pounds since her March 2017 exam, and this is likely en masse the presence of the lipoma.  She reports if anything the areas perhaps small a since initial discovery, and has remained nontender.  Indications for removal would include enlargement, pain or anxiety regarding its presence.  At this time  we'll plan on observation. She's been encouraged to check it monthly at the time of her breast self-examination.    This has been scribed by Caryl-Lyn Otis Brace LPN    [name] 03/11/2016, 11:03 AM

## 2016-04-12 ENCOUNTER — Other Ambulatory Visit: Payer: Self-pay | Admitting: Family Medicine

## 2016-04-23 ENCOUNTER — Encounter: Payer: Self-pay | Admitting: Physician Assistant

## 2016-04-23 ENCOUNTER — Ambulatory Visit: Payer: Self-pay | Admitting: Physician Assistant

## 2016-04-23 VITALS — BP 145/89 | HR 83 | Temp 98.2°F

## 2016-04-23 DIAGNOSIS — J069 Acute upper respiratory infection, unspecified: Secondary | ICD-10-CM

## 2016-04-23 MED ORDER — FLUCONAZOLE 150 MG PO TABS: ORAL_TABLET | ORAL | 0 refills | Status: DC

## 2016-04-23 MED ORDER — BENZONATATE 200 MG PO CAPS: 200.0000 mg | ORAL_CAPSULE | Freq: Three times a day (TID) | ORAL | 0 refills | Status: DC | PRN

## 2016-04-23 MED ORDER — AMOXICILLIN 875 MG PO TABS: 875.0000 mg | ORAL_TABLET | Freq: Two times a day (BID) | ORAL | 0 refills | Status: DC

## 2016-04-23 NOTE — Progress Notes (Signed)
S: C/o runny nose and congestion with sore throat for 3 days, no fever, chills, cp/sob, v/d; mucus was green this am but clear throughout the day, cough is sporadic,   Using otc meds:   O: PE: vitals wnl, nad, perrl eomi, normocephalic, tms dull, nasal mucosa red and swollen, throat injected, neck supple no lymph, lungs c t a, cv rrr, neuro intact  A:  Acute uri   P: drink fluids, continue regular meds , use otc meds of choice, return if not improving in 5 days, return earlier if worsening, amoxil, diflucan

## 2016-04-27 ENCOUNTER — Other Ambulatory Visit: Payer: Self-pay | Admitting: Family Medicine

## 2016-04-27 DIAGNOSIS — E785 Hyperlipidemia, unspecified: Secondary | ICD-10-CM

## 2016-05-06 ENCOUNTER — Ambulatory Visit (INDEPENDENT_AMBULATORY_CARE_PROVIDER_SITE_OTHER): Payer: Federal, State, Local not specified - PPO | Admitting: Family Medicine

## 2016-05-06 ENCOUNTER — Encounter: Payer: Self-pay | Admitting: Family Medicine

## 2016-05-06 ENCOUNTER — Other Ambulatory Visit
Admission: RE | Admit: 2016-05-06 | Discharge: 2016-05-06 | Disposition: A | Discharge status: Home / Self Care | Payer: Federal, State, Local not specified - PPO | Source: Ambulatory Visit | Attending: Family Medicine | Admitting: Family Medicine

## 2016-05-06 VITALS — BP 140/69 | HR 91 | Temp 98.5°F | Resp 16 | Ht 64.0 in | Wt 168.7 lb

## 2016-05-06 DIAGNOSIS — E785 Hyperlipidemia, unspecified: Secondary | ICD-10-CM | POA: Diagnosis not present

## 2016-05-06 DIAGNOSIS — I10 Essential (primary) hypertension: Secondary | ICD-10-CM

## 2016-05-06 LAB — LIPID PANEL
CHOL/HDL RATIO: 2.1 ratio
CHOLESTEROL: 125 mg/dL (ref 0–200)
HDL: 59 mg/dL (ref >40)
LDL Cholesterol: 54 mg/dL (ref 0–99)
Triglycerides: 60 mg/dL (ref <150)
VLDL: 12 mg/dL (ref 0–40)

## 2016-05-06 LAB — COMPREHENSIVE METABOLIC PANEL
ALBUMIN: 4.2 g/dL (ref 3.5–5.0)
ALT: 19 U/L (ref 14–54)
AST: 16 U/L (ref 15–41)
Alkaline Phosphatase: 67 U/L (ref 38–126)
Anion gap: 7 (ref 5–15)
BUN: 13 mg/dL (ref 6–20)
CO2: 29 mmol/L (ref 22–32)
Calcium: 9.2 mg/dL (ref 8.9–10.3)
Chloride: 100 mmol/L — ABNORMAL LOW (ref 101–111)
Creatinine, Ser: 0.6 mg/dL (ref 0.44–1.00)
GFR calc Af Amer: >60 mL/min (ref >60)
GLUCOSE: 126 mg/dL — AB (ref 65–99)
POTASSIUM: 3.8 mmol/L (ref 3.5–5.1)
SODIUM: 136 mmol/L (ref 135–145)
Total Bilirubin: 0.7 mg/dL (ref 0.3–1.2)
Total Protein: 7.7 g/dL (ref 6.5–8.1)

## 2016-05-06 MED ORDER — ATORVASTATIN CALCIUM 20 MG PO TABS: 20.0000 mg | ORAL_TABLET | Freq: Every day | ORAL | 1 refills | Status: DC

## 2016-05-06 MED ORDER — VALSARTAN-HYDROCHLOROTHIAZIDE 160-12.5 MG PO TABS: 1.0000 | ORAL_TABLET | Freq: Every day | ORAL | 1 refills | Status: DC

## 2016-05-06 NOTE — Progress Notes (Signed)
Name: Valerie Manning   MRN: [id number]    DOB: 09-01-1965   Date:05/06/2016       Progress Note  Subjective  Chief Complaint  Chief Complaint  Patient presents with  . Follow-up    3 mo  . Labs Only    Hyperlipidemia  This is a chronic problem. The problem is controlled. Recent lipid tests were reviewed and are normal. Exacerbating diseases include diabetes. Pertinent negatives include no chest pain, leg pain, myalgias or shortness of breath. Current antihyperlipidemic treatment includes statins.  Hypertension  This is a chronic problem. The problem is unchanged. The problem is controlled. Pertinent negatives include no blurred vision, chest pain, headaches, palpitations or shortness of breath. Past treatments include angiotensin blockers and diuretics. There is no history of kidney disease, CAD/MI or CVA.    Past Medical History:  Diagnosis Date  . Diabetes mellitus without complication ([facility])   . Hyperlipidemia   . Hypertension     Past Surgical History:  Procedure Laterality Date  . ABDOMINAL HYSTERECTOMY    . BREAST BIOPSY     x2- neg    Family History  Problem Relation Age of Onset  . Diabetes Mother   . Diabetes Sister   . Heart disease Sister   . Diabetes Sister   . Diabetes Sister   . Thyroid disease Sister   . Diabetes Sister   . Thyroid disease Sister   . Thyroid disease Sister   . Heart disease Father   . Cancer Neg Hx   . Breast cancer Neg Hx     Social History   Social History  . Marital status: Married    Spouse name: N/A  . Number of children: N/A  . Years of education: N/A   Occupational History  . Not on file.   Social History Main Topics  . Smoking status: Never Smoker  . Smokeless tobacco: Never Used  . Alcohol use No  . Drug use: No  . Sexual activity: Yes    Birth control/ protection: Surgical   Other Topics Concern  . Not on file   Social History Narrative  . No narrative on file     Current Outpatient Prescriptions:  .   aspirin EC 81 MG tablet, Take by mouth., Disp: , Rfl:  .  atorvastatin (LIPITOR) 20 MG tablet, take 1 tablet by mouth once daily, Disp: 90 tablet, Rfl: 1 .  glipiZIDE (GLUCOTROL XL) 5 MG 24 hr tablet, , Disp: , Rfl: 0 .  glucose blood (ONE TOUCH TEST STRIPS) test strip, Test BS twice daily  DX E11.9, Disp: 100 each, Rfl: 12 .  metFORMIN (GLUCOPHAGE) 500 MG tablet, Take 500 mg by mouth 2 (two) times daily with a meal., Disp: , Rfl: 0 .  Multiple Vitamin (MULTIVITAMIN) tablet, Take 1 tablet by mouth daily., Disp: , Rfl:  .  omeprazole (PRILOSEC) 20 MG capsule, take 1 capsule by mouth once daily, Disp: 30 capsule, Rfl: 1 .  SYNTHROID 175 MCG tablet, , Disp: , Rfl: 0 .  valsartan-hydrochlorothiazide (DIOVAN-HCT) 160-12.5 MG tablet, take 1 tablet by mouth once daily, Disp: 30 tablet, Rfl: 5 .  amoxicillin (AMOXIL) 875 MG tablet, Take 1 tablet (875 mg total) by mouth 2 (two) times daily. (Patient not taking: Reported on 05/06/2016), Disp: 20 tablet, Rfl: 0 .  benzonatate (TESSALON) 200 MG capsule, Take 1 capsule (200 mg total) by mouth 3 (three) times daily as needed for cough. (Patient not taking: Reported on 05/06/2016), Disp: 30 capsule,  Rfl: 0 .  fluconazole (DIFLUCAN) 150 MG tablet, Take one now and one in a week (Patient not taking: Reported on 05/06/2016), Disp: 2 tablet, Rfl: 0  No Known Allergies   Review of Systems  Eyes: Negative for blurred vision.  Respiratory: Negative for shortness of breath.   Cardiovascular: Negative for chest pain and palpitations.  Musculoskeletal: Negative for myalgias.  Neurological: Negative for headaches.    Objective  Vitals:   05/06/16 1050  BP: 140/69  Pulse: 91  Resp: 16  Temp: 98.5 F (36.9 C)  TempSrc: Oral  SpO2: 96%  Weight: 168 lb 11.2 oz (76.5 kg)  Height: 5\' 4"  (1.626 m)    Physical Exam  Constitutional: She is oriented to person, place, and time and well-developed, well-nourished, and in no distress.  Cardiovascular: Normal rate,  regular rhythm, S1 normal, S2 normal and normal heart sounds.   No murmur heard. Pulmonary/Chest: Effort normal and breath sounds normal. She has no wheezes. She has no rhonchi.  Abdominal: Soft. Bowel sounds are normal. There is no tenderness.  Musculoskeletal:       Right ankle: She exhibits no swelling.       Left ankle: She exhibits no swelling.  Neurological: She is alert and oriented to person, place, and time.  Psychiatric: Mood, memory, affect and judgment normal.  Nursing note and vitals reviewed.    Assessment & Plan  1. Hyperlipidemia, unspecified hyperlipidemia type  - Lipid Profile - COMPLETE METABOLIC PANEL WITH GFR - atorvastatin (LIPITOR) 20 MG tablet; Take 1 tablet (20 mg total) by mouth daily.  Dispense: 90 tablet; Refill: 1  2. Essential hypertension  - valsartan-hydrochlorothiazide (DIOVAN-HCT) 160-12.5 MG tablet; Take 1 tablet by mouth daily.  Dispense: 90 tablet; Refill: 1   Naoko Diperna Asad A[facility] 05/06/2016 11:18 AM

## 2016-05-10 ENCOUNTER — Telehealth: Payer: Self-pay | Admitting: Family Medicine

## 2016-05-10 NOTE — Telephone Encounter (Signed)
Pt returning your call she think it is pertaining to lab work

## 2016-05-13 ENCOUNTER — Other Ambulatory Visit: Payer: Self-pay | Admitting: Family Medicine

## 2016-05-13 DIAGNOSIS — S72401A Unspecified fracture of lower end of right femur, initial encounter for closed fracture: Secondary | ICD-10-CM | POA: Diagnosis not present

## 2016-05-13 DIAGNOSIS — M7989 Other specified soft tissue disorders: Secondary | ICD-10-CM | POA: Diagnosis not present

## 2016-05-13 DIAGNOSIS — W19XXXA Unspecified fall, initial encounter: Secondary | ICD-10-CM | POA: Diagnosis not present

## 2016-05-13 DIAGNOSIS — S8991XA Unspecified injury of right lower leg, initial encounter: Secondary | ICD-10-CM | POA: Diagnosis not present

## 2016-05-13 DIAGNOSIS — S99912A Unspecified injury of left ankle, initial encounter: Secondary | ICD-10-CM | POA: Diagnosis not present

## 2016-05-13 DIAGNOSIS — M25561 Pain in right knee: Secondary | ICD-10-CM | POA: Diagnosis not present

## 2016-05-14 NOTE — Telephone Encounter (Signed)
Returned patient call and she has been notified of lab results

## 2016-05-17 ENCOUNTER — Ambulatory Visit
Admission: RE | Admit: 2016-05-17 | Discharge: 2016-05-17 | Disposition: A | Discharge status: Home / Self Care | Payer: Federal, State, Local not specified - PPO | Source: Ambulatory Visit | Attending: Family Medicine | Admitting: Family Medicine

## 2016-05-17 ENCOUNTER — Ambulatory Visit: Admission: RE | Admit: 2016-05-17 | Payer: Federal, State, Local not specified - PPO | Source: Ambulatory Visit

## 2016-05-17 DIAGNOSIS — X58XXXA Exposure to other specified factors, initial encounter: Secondary | ICD-10-CM | POA: Insufficient documentation

## 2016-05-17 DIAGNOSIS — E032 Hypothyroidism due to medicaments and other exogenous substances: Secondary | ICD-10-CM | POA: Diagnosis not present

## 2016-05-17 DIAGNOSIS — M1711 Unilateral primary osteoarthritis, right knee: Secondary | ICD-10-CM | POA: Insufficient documentation

## 2016-05-17 DIAGNOSIS — S72401A Unspecified fracture of lower end of right femur, initial encounter for closed fracture: Secondary | ICD-10-CM

## 2016-05-17 DIAGNOSIS — E784 Other hyperlipidemia: Secondary | ICD-10-CM | POA: Diagnosis not present

## 2016-05-17 DIAGNOSIS — S82091A Other fracture of right patella, initial encounter for closed fracture: Secondary | ICD-10-CM | POA: Insufficient documentation

## 2016-05-17 DIAGNOSIS — I10 Essential (primary) hypertension: Secondary | ICD-10-CM | POA: Diagnosis not present

## 2016-05-17 DIAGNOSIS — S8991XA Unspecified injury of right lower leg, initial encounter: Secondary | ICD-10-CM | POA: Diagnosis present

## 2016-05-17 DIAGNOSIS — E119 Type 2 diabetes mellitus without complications: Secondary | ICD-10-CM | POA: Diagnosis not present

## 2016-05-21 DIAGNOSIS — I158 Other secondary hypertension: Secondary | ICD-10-CM | POA: Diagnosis not present

## 2016-05-21 DIAGNOSIS — E119 Type 2 diabetes mellitus without complications: Secondary | ICD-10-CM | POA: Diagnosis not present

## 2016-05-21 DIAGNOSIS — S82001A Unspecified fracture of right patella, initial encounter for closed fracture: Secondary | ICD-10-CM | POA: Diagnosis not present

## 2016-05-21 DIAGNOSIS — E039 Hypothyroidism, unspecified: Secondary | ICD-10-CM | POA: Diagnosis not present

## 2016-05-21 DIAGNOSIS — E784 Other hyperlipidemia: Secondary | ICD-10-CM | POA: Diagnosis not present

## 2016-05-23 ENCOUNTER — Other Ambulatory Visit: Payer: Self-pay

## 2016-05-23 DIAGNOSIS — R928 Other abnormal and inconclusive findings on diagnostic imaging of breast: Secondary | ICD-10-CM

## 2016-05-29 DIAGNOSIS — M25561 Pain in right knee: Secondary | ICD-10-CM | POA: Diagnosis not present

## 2016-05-29 DIAGNOSIS — S82001D Unspecified fracture of right patella, subsequent encounter for closed fracture with routine healing: Secondary | ICD-10-CM | POA: Diagnosis not present

## 2016-05-30 DIAGNOSIS — K080 Exfoliation of teeth due to systemic causes: Secondary | ICD-10-CM | POA: Diagnosis not present

## 2016-06-05 ENCOUNTER — Other Ambulatory Visit: Payer: Self-pay | Admitting: Family Medicine

## 2016-06-12 DIAGNOSIS — M25561 Pain in right knee: Secondary | ICD-10-CM | POA: Diagnosis not present

## 2016-06-12 DIAGNOSIS — S82001D Unspecified fracture of right patella, subsequent encounter for closed fracture with routine healing: Secondary | ICD-10-CM | POA: Diagnosis not present

## 2016-06-12 DIAGNOSIS — M1711 Unilateral primary osteoarthritis, right knee: Secondary | ICD-10-CM | POA: Diagnosis not present

## 2016-06-16 HISTORY — PX: BREAST EXCISIONAL BIOPSY: SUR124

## 2016-07-01 DIAGNOSIS — R7889 Finding of other specified substances, not normally found in blood: Secondary | ICD-10-CM | POA: Diagnosis not present

## 2016-07-01 DIAGNOSIS — E032 Hypothyroidism due to medicaments and other exogenous substances: Secondary | ICD-10-CM | POA: Diagnosis not present

## 2016-07-02 DIAGNOSIS — E034 Atrophy of thyroid (acquired): Secondary | ICD-10-CM | POA: Diagnosis not present

## 2016-07-02 DIAGNOSIS — R7889 Finding of other specified substances, not normally found in blood: Secondary | ICD-10-CM | POA: Diagnosis not present

## 2016-07-11 DIAGNOSIS — M25561 Pain in right knee: Secondary | ICD-10-CM | POA: Diagnosis not present

## 2016-07-11 DIAGNOSIS — S82001D Unspecified fracture of right patella, subsequent encounter for closed fracture with routine healing: Secondary | ICD-10-CM | POA: Diagnosis not present

## 2016-07-25 ENCOUNTER — Other Ambulatory Visit: Payer: Self-pay

## 2016-07-25 ENCOUNTER — Ambulatory Visit: Payer: Federal, State, Local not specified - PPO

## 2016-07-26 ENCOUNTER — Ambulatory Visit
Admission: RE | Admit: 2016-07-26 | Discharge: 2016-07-26 | Disposition: A | Discharge status: Home / Self Care | Payer: Federal, State, Local not specified - PPO | Source: Ambulatory Visit | Attending: General Surgery | Admitting: General Surgery

## 2016-07-26 DIAGNOSIS — R928 Other abnormal and inconclusive findings on diagnostic imaging of breast: Secondary | ICD-10-CM

## 2016-07-30 DIAGNOSIS — E784 Other hyperlipidemia: Secondary | ICD-10-CM | POA: Diagnosis not present

## 2016-07-30 DIAGNOSIS — I10 Essential (primary) hypertension: Secondary | ICD-10-CM | POA: Diagnosis not present

## 2016-07-30 DIAGNOSIS — E119 Type 2 diabetes mellitus without complications: Secondary | ICD-10-CM | POA: Diagnosis not present

## 2016-07-30 DIAGNOSIS — I158 Other secondary hypertension: Secondary | ICD-10-CM | POA: Diagnosis not present

## 2016-07-30 DIAGNOSIS — E032 Hypothyroidism due to medicaments and other exogenous substances: Secondary | ICD-10-CM | POA: Diagnosis not present

## 2016-08-01 ENCOUNTER — Encounter: Payer: Self-pay | Admitting: General Surgery

## 2016-08-01 ENCOUNTER — Ambulatory Visit (INDEPENDENT_AMBULATORY_CARE_PROVIDER_SITE_OTHER): Payer: Federal, State, Local not specified - PPO | Admitting: General Surgery

## 2016-08-01 VITALS — BP 144/90 | HR 78 | Resp 12 | Ht 64.0 in | Wt 175.0 lb

## 2016-08-01 DIAGNOSIS — R928 Other abnormal and inconclusive findings on diagnostic imaging of breast: Secondary | ICD-10-CM

## 2016-08-01 NOTE — Progress Notes (Signed)
Patient ID: Valerie Manning, female   DOB: [birth year], 51 y.o.   MRN: [id number]  Chief Complaint  Patient presents with  . Follow-up    HPI Valerie Manning is a 51 y.o. female.  who presents for a breast evaluation. The most recent mammogram was done on 07-26-16.  Patient does perform regular self breast checks and gets regular mammograms done.   She does admit to mild right breast tenderness lasting 2-3 days prior to mammogram. No pain since mammogram. She is here with her husband, Valerie Manning.  HPI  Past Medical History:  Diagnosis Date  . Diabetes mellitus without complication ([place])   . Hyperlipidemia   . Hypertension     Past Surgical History:  Procedure Laterality Date  . ABDOMINAL HYSTERECTOMY    . BREAST BIOPSY Right 05/2015   Pathology revealed stromal fibrosis with intra atrophic benign x2 areas    Family History  Problem Relation Age of Onset  . Diabetes Mother   . Diabetes Sister   . Heart disease Sister   . Diabetes Sister   . Diabetes Sister   . Thyroid disease Sister   . Diabetes Sister   . Thyroid disease Sister   . Thyroid disease Sister   . Heart disease Father   . Cancer Neg Hx   . Breast cancer Neg Hx     Social History Social History  Substance Use Topics  . Smoking status: Never Smoker  . Smokeless tobacco: Never Used  . Alcohol use No    No Known Allergies  Current Outpatient Prescriptions  Medication Sig Dispense Refill  . aspirin EC 81 MG tablet Take by mouth.    Marland Kitchen atorvastatin (LIPITOR) 20 MG tablet Take 1 tablet (20 mg total) by mouth daily. 90 tablet 1  . glipiZIDE (GLUCOTROL XL) 5 MG 24 hr tablet   0  . glucose blood (ONE TOUCH TEST STRIPS) test strip Test BS twice daily  DX E11.9 100 each 12  . metFORMIN (GLUCOPHAGE) 500 MG tablet Take 500 mg by mouth 2 (two) times daily with a meal.  0  . Multiple Vitamin (MULTIVITAMIN) tablet Take 1 tablet by mouth daily.    Marland Kitchen omeprazole (PRILOSEC) 20 MG capsule take 1 capsule by mouth once  daily 90 capsule 1  . SYNTHROID 175 MCG tablet   0  . valsartan-hydrochlorothiazide (DIOVAN-HCT) 160-12.5 MG tablet Take 1 tablet by mouth daily. 90 tablet 1   No current facility-administered medications for this visit.     Review of Systems Review of Systems  Constitutional: Negative.   Respiratory: Negative.   Cardiovascular: Negative.   Neurological: Positive for headaches.    Blood pressure (!) 144/90, pulse 78, resp. rate 12, height 5\' 4"  (1.626 m), weight 175 lb (79.4 kg).  Physical Exam Physical Exam  Constitutional: She is oriented to person, place, and time. She appears well-developed and well-nourished.  HENT:  Mouth/Throat: Oropharynx is clear and moist.  Eyes: Conjunctivae are normal. No scleral icterus.  Neck: Neck supple.  Lipoma right neck  Cardiovascular: Normal rate, regular rhythm and normal heart sounds.   Pulmonary/Chest: Effort normal and breath sounds normal. Right breast exhibits no inverted nipple, no mass, no nipple discharge, no skin change and no tenderness. Left breast exhibits no inverted nipple, no mass, no nipple discharge, no skin change and no tenderness.  Lymphadenopathy:    She has no cervical adenopathy.    She has no axillary adenopathy.  Neurological: She is alert and oriented to person,  place, and time.  Skin: Skin is warm and dry.  Psychiatric: Her behavior is normal.    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 SEE COMMENT USUAL DUCTAL HYPERPLASIA FIBROADENOMA AND FIBROCYSTIC CHANGES WITH ASSOCIATED CALCIFICATION Microscopic Comment The differential diagnosis includes previous biopsy sites changes versus complex sclerosing lesions.  06/09/2015 superficial biopsy: A. RIGHT BREAST, 8:00, 2 CM FROM NIPPLE; BIOPSY:  - BENIGN BREAST TISSUE WITH FIBROCYSTIC CHANGE.   Lateral mammograms dated 07/26/2016 were reviewed. No interval change in  the area of distortion biopsied in February 2017, repeat recommendation for excisional biopsy of the area of distortion. BI-RADS-4.  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 No interval change in the past 12 months.  Benign breast biopsy February 2017 superficial retroareolar tissue.    Plan    Options for management were again reviewed: 1) sees continued observation versus 2) excisional biopsy with wire localization. The latter procedure was reviewed in detail.  The lack of interval change over the past year as well as the benign pathology with a complex sclerosing lesion without atypia makes observation reasonable. The patient has been sensitized to the possibility of a more occult process and may elect to proceed with excisional biopsy.  Her husband was present for the interview and exam.  The patient will consider her options and notify a soft like to proceed. 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       She is to call back concerning terminology on her form at home.  HPI, Physical Exam, Assessment and Plan have been scribed under the direction and in the presence of [name], MD.  I have completed the exam and reviewed the above documentation for accuracy and completeness.  I agree with the above.  Haematologist has been used and any errors in dictation or transcription are unintentional.  Hervey Ard, M.D., F.A.C.S.  Karie Fetch, RN  [name] 08/01/2016, 12:04 PM   Surgery instructions were reviewed with the patient today. Patient was given possible surgery dates to review. She will check her schedule and notify our office when she would like to proceed. Patient will most likely have this completed in May 2018 at a time most convenient around her work schedule. It is okay for patient  to continue an 81 mg aspirin once daily pre-operatively.   Ledell Noss, [place]

## 2016-08-01 NOTE — Patient Instructions (Signed)
The patient is aware to call back for any questions or concerns.  

## 2016-08-07 ENCOUNTER — Telehealth: Payer: Self-pay | Admitting: *Deleted

## 2016-08-07 NOTE — Telephone Encounter (Signed)
Patient called the office wanting to schedule surgery for 09-13-16.   This will be arranged accordingly.   She will be contacted once surgery is arranged to notify her of date, time, and instructions.

## 2016-08-08 ENCOUNTER — Other Ambulatory Visit: Payer: Self-pay | Admitting: General Surgery

## 2016-08-08 DIAGNOSIS — R928 Other abnormal and inconclusive findings on diagnostic imaging of breast: Secondary | ICD-10-CM

## 2016-08-08 NOTE — Telephone Encounter (Signed)
Patient has been notified of surgery arrival time on 09-13-16. This patient verbalizes understanding.  She was instructed to call the office should she have further questions.

## 2016-09-02 ENCOUNTER — Encounter: Payer: Self-pay | Admitting: Physician Assistant

## 2016-09-02 ENCOUNTER — Ambulatory Visit: Payer: Self-pay | Admitting: Physician Assistant

## 2016-09-02 ENCOUNTER — Other Ambulatory Visit: Payer: Self-pay | Admitting: General Surgery

## 2016-09-02 VITALS — BP 140/92 | HR 80 | Temp 98.6°F

## 2016-09-02 DIAGNOSIS — M25561 Pain in right knee: Secondary | ICD-10-CM

## 2016-09-02 NOTE — Progress Notes (Signed)
S: c/o r knee pain, fell and broke the outside part of the knee back in January, got well and now she fell while washing her truck, the ground was slick, no loc, didn't hit her head, no wrist or arm pain, has a knee brace which she wore at work and it helped a lot, denies numbness or tingling. Taking mobic for the pain  O: vitals wnl, nad, skin intact, no bony tenderness noted, small amount of swelling noted medially, full rom, n/v intact  A: contusion to knee  P: continue to wear knee brace, use ice, nsaids, elevation prn, if not better in 2 weeks will order xray and see pt in office after xray

## 2016-09-06 ENCOUNTER — Encounter
Admission: RE | Admit: 2016-09-06 | Discharge: 2016-09-06 | Disposition: A | Discharge status: Home / Self Care | Payer: Federal, State, Local not specified - PPO | Source: Ambulatory Visit | Attending: General Surgery | Admitting: General Surgery

## 2016-09-06 HISTORY — DX: Cramp and spasm: R25.2

## 2016-09-06 NOTE — Patient Instructions (Signed)
  Your procedure is scheduled on: 09/13/16 Report to .[place] AM   Remember: Instructions that are not followed completely may result in serious medical risk, up to and including death, or upon the discretion of your surgeon and anesthesiologist your surgery may need to be rescheduled.    __X__ 1. Do not eat food or drink liquids after midnight. No gum chewing or hard candies.     ____ 2. No Alcohol for 24 hours before or after surgery.   ____ 3. Do Not Smoke For 24 Hours Prior to Your Surgery.   ____ 4. Bring all medications with you on the day of surgery if instructed.    __X__ 5. Notify your doctor if there is any change in your medical condition     (cold, fever, infections).       Do not wear jewelry, make-up, hairpins, clips or nail polish.  Do not wear lotions, powders, or perfumes. You may wear deodorant.  Do not shave 48 hours prior to surgery. Men may shave face and neck.  Do not bring valuables to the hospital.    [facility] is not responsible for any belongings or valuables.               Contacts, dentures or bridgework may not be worn into surgery.  Leave your suitcase in the car. After surgery it may be brought to your room.  For patients admitted to the hospital, discharge time is determined by your                treatment team.   Patients discharged the day of surgery will not be allowed to drive home.   X____ Take these medicines the morning of surgery with A SIP OF WATER:    1. SYNTHROID  2. OMEPRAZOLE  3.   4.  5.  6.  ____ Fleet Enema (as directed)   ____ Use CHG Soap as directed  ____ Use inhalers on the day of surgery  _X___ Stop metformin 2 days prior to surgery    ____ Take 1/2 of usual insulin dose the night before surgery and none on the morning of surgery.   ____ Stop Coumadin/Plavix/aspirin on   __X__ Stop Anti-inflammatories on  STOP MOBIC NOW UNTIL AFTER SURGERY   ____ Stop supplements until after surgery.     ____ Bring C-Pap to the hospital.

## 2016-09-09 ENCOUNTER — Encounter
Admission: RE | Admit: 2016-09-09 | Discharge: 2016-09-09 | Disposition: A | Discharge status: Home / Self Care | Payer: Federal, State, Local not specified - PPO | Source: Ambulatory Visit | Attending: General Surgery | Admitting: General Surgery

## 2016-09-09 ENCOUNTER — Ambulatory Visit (INDEPENDENT_AMBULATORY_CARE_PROVIDER_SITE_OTHER): Payer: Federal, State, Local not specified - PPO | Admitting: *Deleted

## 2016-09-09 ENCOUNTER — Telehealth: Payer: Self-pay | Admitting: *Deleted

## 2016-09-09 DIAGNOSIS — R9431 Abnormal electrocardiogram [ECG] [EKG]: Secondary | ICD-10-CM | POA: Insufficient documentation

## 2016-09-09 DIAGNOSIS — I10 Essential (primary) hypertension: Secondary | ICD-10-CM | POA: Diagnosis not present

## 2016-09-09 DIAGNOSIS — E119 Type 2 diabetes mellitus without complications: Secondary | ICD-10-CM | POA: Diagnosis not present

## 2016-09-09 DIAGNOSIS — R928 Other abnormal and inconclusive findings on diagnostic imaging of breast: Secondary | ICD-10-CM

## 2016-09-09 DIAGNOSIS — I498 Other specified cardiac arrhythmias: Secondary | ICD-10-CM | POA: Insufficient documentation

## 2016-09-09 DIAGNOSIS — Z01812 Encounter for preprocedural laboratory examination: Secondary | ICD-10-CM | POA: Insufficient documentation

## 2016-09-09 DIAGNOSIS — Z01810 Encounter for preprocedural cardiovascular examination: Secondary | ICD-10-CM | POA: Insufficient documentation

## 2016-09-09 LAB — CBC
HCT: 40.9 % (ref 35.0–47.0)
Hemoglobin: 13.2 g/dL (ref 12.0–16.0)
MCH: 25.8 pg — ABNORMAL LOW (ref 26.0–34.0)
MCHC: 32.4 g/dL (ref 32.0–36.0)
MCV: 79.8 fL — ABNORMAL LOW (ref 80.0–100.0)
Platelets: 207 K/uL (ref 150–440)
RBC: 5.13 MIL/uL (ref 3.80–5.20)
RDW: 15.2 % — ABNORMAL HIGH (ref 11.5–14.5)
WBC: 4.9 K/uL (ref 3.6–11.0)

## 2016-09-09 LAB — BASIC METABOLIC PANEL
ANION GAP: 9 (ref 5–15)
BUN: 15 mg/dL (ref 6–20)
CALCIUM: 8.8 mg/dL — AB (ref 8.9–10.3)
CHLORIDE: 99 mmol/L — AB (ref 101–111)
CO2: 29 mmol/L (ref 22–32)
CREATININE: 0.62 mg/dL (ref 0.44–1.00)
GFR calc non Af Amer: >60 mL/min (ref >60)
Glucose, Bld: 142 mg/dL — ABNORMAL HIGH (ref 65–99)
Potassium: 3.7 mmol/L (ref 3.5–5.1)
SODIUM: 137 mmol/L (ref 135–145)

## 2016-09-09 NOTE — Telephone Encounter (Signed)
Per Nira Conn in Pre-admission Testing, patient will require medical clearance from Dr. Manuella Ghazi prior to right breast biopsy with needle loc that is scheduled at [facility] for Friday, 09-13-16.  [facility] faxed the medical clearance request to Dr. Trena Platt office.   In basket message was sent to Dr. Manuella Ghazi to notify him of the need for medical clearance.   Dr. Trena Platt office has arranged for an appointment for medical clearance for Wednesday, 09-11-16 at 2:40 pm.

## 2016-09-09 NOTE — Pre-Procedure Instructions (Signed)
Patient complained of red rash on upper body from bite received last week, seen in employee health and placed on a cortisone cream. Stated "getting better." Instructed to inform Dr. Bary Castilla office of rash.

## 2016-09-09 NOTE — Pre-Procedure Instructions (Signed)
SPOKE WITH CASSANDRA AT DR [facility] OFFICE AND INFORMED HER PT IS NEEDING MEDICAL CLEARANCE PRIOR TO 09-13-16 SURGERY WITH DR BYRNETT-I  ALSO TOLD HER I HAD FAXED ALL OF THIS INFO OVER-CALLED MICHELE AT DR [facility] OFFICE AND INFORMED HER OF THIS ALSO AND FAXED CLEARANCE INFO TO DR [facility] OFFICE WITH FAX CONFIRMATION RECEIVED

## 2016-09-09 NOTE — Progress Notes (Signed)
Patient came in this morning states she got bit by an insect on her belly and right breast. Parent states the area was swollen and red for two days with a little rash. Patient went to the employee health where they give her to Hydrocortisone cream.  She states the area has got better. She stop by to make sure the area was okay and still can have surgery on Friday.  Advised patient to continue to use the cream and call if there area becomes red or swollen.

## 2016-09-09 NOTE — Pre-Procedure Instructions (Signed)
CALLED DR ADAMS REGARDING ABNORMAL EKG-NO PREVIOUS EKG'S FOR COMPARISON- DR ADAMS REQUESTING MEDICAL CLEARANCE-FAXED CLEARANCE REQUEST ALONG WITH EKG TO DR [facility] OFFICE WITH FAX CONFIRMATION RECEIVED

## 2016-09-11 ENCOUNTER — Encounter: Payer: Self-pay | Admitting: Family Medicine

## 2016-09-11 ENCOUNTER — Ambulatory Visit (INDEPENDENT_AMBULATORY_CARE_PROVIDER_SITE_OTHER): Payer: Federal, State, Local not specified - PPO | Admitting: Family Medicine

## 2016-09-11 ENCOUNTER — Encounter: Payer: Self-pay | Admitting: Physician Assistant

## 2016-09-11 VITALS — BP 138/79 | HR 102 | Temp 98.0°F | Resp 17 | Ht 63.0 in | Wt 177.3 lb

## 2016-09-11 DIAGNOSIS — R9431 Abnormal electrocardiogram [ECG] [EKG]: Secondary | ICD-10-CM

## 2016-09-11 DIAGNOSIS — Z01818 Encounter for other preprocedural examination: Secondary | ICD-10-CM | POA: Diagnosis not present

## 2016-09-11 NOTE — Progress Notes (Signed)
Name: Valerie Manning   MRN: [id number]    DOB: [birth year]   Date:09/11/2016       Progress Note  Subjective  Chief Complaint  Chief Complaint  Patient presents with  . Follow-up    surgicial clearance    HPI  Pt. Presents for pre-operative clearance for a planned right breast biopsy with needle localization to be performed by Dr. Bary Castilla on 5/181/18. She had an abnormal diagnostic mammogram of right breast showing possible architectural distortion in the right breast.  She had an EKG done for pre-operative clearance showed T- wave changes, no previous EKG available for comparison.  She has Diabetes, Hyperlipidemia, FHx of 'heart problems' sister has a pacemaker. She denies any dyspnea, dizziness, chest pain, diaphoresis, or other concerning cardiopulmonary symptoms.    Past Medical History:  Diagnosis Date  . Cramp in muscle    H/O OF MUSCLE CRAMP IN CHEST AND STOMACH OCCAS WHEN BENDS AT WAIST. WHEN STANDS AND BENDS BACK HEARS POP. CRAMP GOES AWAY. HAS NOT HAPPENES IN MANY MONTHS  . Diabetes mellitus without complication ([place])   . Hyperlipidemia   . Hypertension     Past Surgical History:  Procedure Laterality Date  . ABDOMINAL HYSTERECTOMY    . BREAST BIOPSY Right 05/2015   Pathology revealed stromal fibrosis with intra atrophic benign x2 areas    Family History  Problem Relation Age of Onset  . Diabetes Mother   . Diabetes Sister   . Heart disease Sister   . Diabetes Sister   . Diabetes Sister   . Thyroid disease Sister   . Diabetes Sister   . Thyroid disease Sister   . Thyroid disease Sister   . Heart disease Father   . Cancer Neg Hx   . Breast cancer Neg Hx     Social History   Social History  . Marital status: Married    Spouse name: N/A  . Number of children: N/A  . Years of education: N/A   Occupational History  . Not on file.   Social History Main Topics  . Smoking status: Never Smoker  . Smokeless tobacco: Never Used  . Alcohol use No  . Drug  use: No  . Sexual activity: Yes    Birth control/ protection: Surgical   Other Topics Concern  . Not on file   Social History Narrative  . No narrative on file     Current Outpatient Prescriptions:  .  aspirin EC 81 MG tablet, Take 81 mg by mouth daily. , Disp: , Rfl:  .  atorvastatin (LIPITOR) 20 MG tablet, Take 1 tablet (20 mg total) by mouth daily., Disp: 90 tablet, Rfl: 1 .  glipiZIDE (GLUCOTROL XL) 5 MG 24 hr tablet, Take 5 mg by mouth 2 (two) times daily. , Disp: , Rfl: 0 .  meloxicam (MOBIC) 15 MG tablet, Take 15 mg by mouth daily., Disp: , Rfl:  .  metFORMIN (GLUCOPHAGE) 500 MG tablet, Take 500 mg by mouth 2 (two) times daily with a meal., Disp: , Rfl: 0 .  omeprazole (PRILOSEC) 20 MG capsule, take 1 capsule by mouth once daily, Disp: 90 capsule, Rfl: 1 .  SYNTHROID 150 MCG tablet, Take 150 mcg by mouth daily before breakfast. , Disp: , Rfl: 0 .  valsartan-hydrochlorothiazide (DIOVAN-HCT) 160-12.5 MG tablet, Take 1 tablet by mouth daily., Disp: 90 tablet, Rfl: 1  No Known Allergies   ROS  Please see history of present illness for complete discussion of ROS  Objective  Vitals:   09/11/16 1457  BP: 138/79  Pulse: (!) 102  Resp: 17  Temp: 98 F (36.7 C)  TempSrc: Oral  SpO2: 97%  Weight: 177 lb 4.8 oz (80.4 kg)  Height: 5\' 3"  (1.6 m)    Physical Exam  Constitutional: She is oriented to person, place, and time and well-developed, well-nourished, and in no distress.  HENT:  Head: Normocephalic and atraumatic.  Cardiovascular: Normal rate, regular rhythm, S1 normal and S2 normal.   Murmur heard.  Systolic murmur is present with a grade of 1/6  Pulmonary/Chest: No respiratory distress. She has no decreased breath sounds. She has no wheezes. She has no rhonchi.  Abdominal: Soft. Bowel sounds are normal. There is no tenderness.  Musculoskeletal:       Right ankle: She exhibits no swelling.       Left ankle: She exhibits no swelling.  Neurological: She is alert  and oriented to person, place, and time.  Nursing note and vitals reviewed.     Assessment & Plan  1. Preoperative clearance Await cardiology recommendations before clearance  2. Abnormal EKG No concerning cardiopulmonary symptoms, referral to cardiology for abnormal EKG - Ambulatory referral to Cardiology   [facility] 09/11/2016 3:21 PM

## 2016-09-11 NOTE — Progress Notes (Addendum)
Asked to review EKG taken pre-operatively on 5/14 as patient is planning to have  A right breast biopsy on 09/13/16. No prior to compare to. EKG shows NSR, 66 bpm, TWI V2-V4, nonspecific st/t changes V5. Discussed with Dr. Saunders Revel, MD. If patient is asymptomatic, she should be ok to proceed with a right breast biopsy as this is done just under local anesthesia and is an overall low risk procedure. If patient is having any symptoms concerning for ischemia she needs to be seen by cardiology on 5/17 (sooner in the ED, if evaluating MD feels appropriate) for cardiac pre-operative evaluation that would likely include stress testing and she would not be cleared for her biopsy in this setting as we would recommend she evaluate her heart first.   Received follow up from staff that PCP stated patient is at "cardiac risk" and was not satisfied with our above recommendations for PCP to risk stratify this patient for a low-risk procedure. Unfortunately, cardiology was unable to see documentation of patient having active symptoms concerning for ischemia. She does have documented DM, HTN, HLD in PCP note though it is uncertain if she is having ischemic symptoms. If patient is having ischemic symptoms in which the PCP is concerned for "cardiac risk" given her EKG, PCP should send patient to the ED for further evaluation. Again, discussed with Dr. Saunders Revel who agrees with the above.

## 2016-09-12 ENCOUNTER — Telehealth: Payer: Self-pay | Admitting: *Deleted

## 2016-09-12 DIAGNOSIS — E119 Type 2 diabetes mellitus without complications: Secondary | ICD-10-CM | POA: Diagnosis not present

## 2016-09-12 DIAGNOSIS — R9431 Abnormal electrocardiogram [ECG] [EKG]: Secondary | ICD-10-CM | POA: Diagnosis not present

## 2016-09-12 DIAGNOSIS — E785 Hyperlipidemia, unspecified: Secondary | ICD-10-CM | POA: Diagnosis not present

## 2016-09-12 DIAGNOSIS — I10 Essential (primary) hypertension: Secondary | ICD-10-CM | POA: Diagnosis not present

## 2016-09-12 NOTE — Telephone Encounter (Signed)
Patient aware that we have received cardiac clearance from Dr. Saralyn Pilar for patient's surgery that is scheduled for tomorrow, 09-13-16.  Clearance has been faxed to Attn: Heather in [place] and to [facility].

## 2016-09-12 NOTE — Pre-Procedure Instructions (Signed)
Per Selinda Eon at Dr Dwyane Luo office, pt is having a stress test done today at 3:30 with Dr Saralyn Pilar

## 2016-09-12 NOTE — Telephone Encounter (Signed)
Patient called the office back to report that she saw Dr. Saralyn Pilar at [facility] today.   They have requested that patient come back this afternoon at 3:30 pm for a stress test.

## 2016-09-12 NOTE — Telephone Encounter (Signed)
Message for patient to call the office.   Dr. Bary Castilla would like to speak with the patient.

## 2016-09-13 ENCOUNTER — Encounter: Payer: Self-pay | Admitting: *Deleted

## 2016-09-13 ENCOUNTER — Encounter
Admission: RE | Disposition: A | Discharge status: Home / Self Care | Payer: Self-pay | Source: Ambulatory Visit | Attending: General Surgery

## 2016-09-13 ENCOUNTER — Ambulatory Visit: Payer: Federal, State, Local not specified - PPO | Admitting: Anesthesiology

## 2016-09-13 ENCOUNTER — Ambulatory Visit
Admission: RE | Admit: 2016-09-13 | Discharge: 2016-09-13 | Disposition: A | Discharge status: Home / Self Care | Payer: Federal, State, Local not specified - PPO | Source: Ambulatory Visit | Attending: General Surgery | Admitting: General Surgery

## 2016-09-13 DIAGNOSIS — E785 Hyperlipidemia, unspecified: Secondary | ICD-10-CM | POA: Diagnosis not present

## 2016-09-13 DIAGNOSIS — R928 Other abnormal and inconclusive findings on diagnostic imaging of breast: Secondary | ICD-10-CM | POA: Diagnosis not present

## 2016-09-13 DIAGNOSIS — I10 Essential (primary) hypertension: Secondary | ICD-10-CM | POA: Insufficient documentation

## 2016-09-13 DIAGNOSIS — Z7984 Long term (current) use of oral hypoglycemic drugs: Secondary | ICD-10-CM | POA: Diagnosis not present

## 2016-09-13 DIAGNOSIS — Z7982 Long term (current) use of aspirin: Secondary | ICD-10-CM | POA: Diagnosis not present

## 2016-09-13 DIAGNOSIS — K219 Gastro-esophageal reflux disease without esophagitis: Secondary | ICD-10-CM | POA: Insufficient documentation

## 2016-09-13 DIAGNOSIS — Z79899 Other long term (current) drug therapy: Secondary | ICD-10-CM | POA: Diagnosis not present

## 2016-09-13 DIAGNOSIS — N6489 Other specified disorders of breast: Secondary | ICD-10-CM | POA: Diagnosis not present

## 2016-09-13 DIAGNOSIS — N6021 Fibroadenosis of right breast: Secondary | ICD-10-CM | POA: Diagnosis not present

## 2016-09-13 DIAGNOSIS — E119 Type 2 diabetes mellitus without complications: Secondary | ICD-10-CM | POA: Insufficient documentation

## 2016-09-13 HISTORY — PX: BREAST BIOPSY: SHX20

## 2016-09-13 LAB — GLUCOSE, CAPILLARY: Glucose-Capillary: 145 mg/dL — ABNORMAL HIGH (ref 65–99)

## 2016-09-13 SURGERY — BREAST BIOPSY WITH NEEDLE LOCALIZATION
Anesthesia: General | Laterality: Right | Wound class: Clean

## 2016-09-13 MED ORDER — PROPOFOL 10 MG/ML IV BOLUS
INTRAVENOUS | Status: DC | PRN
  Administered 2016-09-13: 150 mg via INTRAVENOUS

## 2016-09-13 MED ORDER — PROPOFOL 10 MG/ML IV BOLUS
INTRAVENOUS | Status: AC
  Filled 2016-09-13: qty 20

## 2016-09-13 MED ORDER — DEXAMETHASONE SODIUM PHOSPHATE 10 MG/ML IJ SOLN
INTRAMUSCULAR | Status: DC | PRN
  Administered 2016-09-13: 5 mg via INTRAVENOUS

## 2016-09-13 MED ORDER — MIDAZOLAM HCL 2 MG/2ML IJ SOLN
INTRAMUSCULAR | Status: DC | PRN
  Administered 2016-09-13: 2 mg via INTRAVENOUS

## 2016-09-13 MED ORDER — LACTATED RINGERS IV SOLN
INTRAVENOUS | Status: DC | PRN
  Administered 2016-09-13: 11:00:00 via INTRAVENOUS

## 2016-09-13 MED ORDER — KETOROLAC TROMETHAMINE 30 MG/ML IJ SOLN
INTRAMUSCULAR | Status: DC | PRN
  Administered 2016-09-13: 30 mg via INTRAVENOUS

## 2016-09-13 MED ORDER — ONDANSETRON HCL 4 MG/2ML IJ SOLN
INTRAMUSCULAR | Status: DC | PRN
  Administered 2016-09-13: 4 mg via INTRAVENOUS

## 2016-09-13 MED ORDER — HYDROCODONE-ACETAMINOPHEN 5-325 MG PO TABS: 1.0000 | ORAL_TABLET | ORAL | 0 refills | Status: DC | PRN

## 2016-09-13 MED ORDER — ACETAMINOPHEN 10 MG/ML IV SOLN
INTRAVENOUS | Status: AC
  Filled 2016-09-13: qty 100

## 2016-09-13 MED ORDER — KETOROLAC TROMETHAMINE 30 MG/ML IJ SOLN
INTRAMUSCULAR | Status: AC
  Filled 2016-09-13: qty 1

## 2016-09-13 MED ORDER — FENTANYL CITRATE (PF) 100 MCG/2ML IJ SOLN
INTRAMUSCULAR | Status: AC
  Filled 2016-09-13: qty 2

## 2016-09-13 MED ORDER — ONDANSETRON HCL 4 MG/2ML IJ SOLN
INTRAMUSCULAR | Status: AC
Start: 2016-09-13 — End: 2016-09-13
  Filled 2016-09-13: qty 2

## 2016-09-13 MED ORDER — FENTANYL CITRATE (PF) 100 MCG/2ML IJ SOLN: 25.0000 ug | INTRAMUSCULAR | Status: DC | PRN

## 2016-09-13 MED ORDER — PROMETHAZINE HCL 25 MG/ML IJ SOLN: 6.2500 mg | INTRAMUSCULAR | Status: DC | PRN

## 2016-09-13 MED ORDER — BUPIVACAINE-EPINEPHRINE 0.5% -1:200000 IJ SOLN
INTRAMUSCULAR | Status: DC | PRN
  Administered 2016-09-13: 30 mL

## 2016-09-13 MED ORDER — ACETAMINOPHEN 10 MG/ML IV SOLN
INTRAVENOUS | Status: DC | PRN
  Administered 2016-09-13: 1000 mg via INTRAVENOUS

## 2016-09-13 MED ORDER — EPHEDRINE SULFATE 50 MG/ML IJ SOLN
INTRAMUSCULAR | Status: DC | PRN
  Administered 2016-09-13: 5 mg via INTRAVENOUS

## 2016-09-13 MED ORDER — SODIUM CHLORIDE 0.9 % IV SOLN
INTRAVENOUS | Status: DC
  Administered 2016-09-13: 10:00:00 via INTRAVENOUS

## 2016-09-13 MED ORDER — MIDAZOLAM HCL 2 MG/2ML IJ SOLN
INTRAMUSCULAR | Status: AC
  Filled 2016-09-13: qty 2

## 2016-09-13 MED ORDER — LIDOCAINE HCL (CARDIAC) 20 MG/ML IV SOLN
INTRAVENOUS | Status: DC | PRN
  Administered 2016-09-13: 100 mg via INTRAVENOUS

## 2016-09-13 MED ORDER — FENTANYL CITRATE (PF) 100 MCG/2ML IJ SOLN
INTRAMUSCULAR | Status: DC | PRN
  Administered 2016-09-13: 25 ug via INTRAVENOUS
  Administered 2016-09-13: 50 ug via INTRAVENOUS
  Administered 2016-09-13: 25 ug via INTRAVENOUS

## 2016-09-13 MED ORDER — BUPIVACAINE-EPINEPHRINE (PF) 0.5% -1:200000 IJ SOLN
INTRAMUSCULAR | Status: AC
  Filled 2016-09-13: qty 30

## 2016-09-13 MED ORDER — DEXAMETHASONE SODIUM PHOSPHATE 10 MG/ML IJ SOLN
INTRAMUSCULAR | Status: AC
  Filled 2016-09-13: qty 1

## 2016-09-13 SURGICAL SUPPLY — 39 items
BANDAGE ELASTIC 6 LF NS (GAUZE/BANDAGES/DRESSINGS) IMPLANT
BLADE SURG 15 STRL SS SAFETY (BLADE) ×4 IMPLANT
BNDG GAUZE 4.5X4.1 6PLY STRL (MISCELLANEOUS) IMPLANT
CANISTER SUCT 1200ML W/VALVE (MISCELLANEOUS) ×2 IMPLANT
CHLORAPREP W/TINT 26ML (MISCELLANEOUS) ×4 IMPLANT
CLIP TI MEDIUM 6 (CLIP) ×2 IMPLANT
CNTNR SPEC 2.5X3XGRAD LEK (MISCELLANEOUS) ×1
CONT SPEC 4OZ STER OR WHT (MISCELLANEOUS) ×1
CONTAINER SPEC 2.5X3XGRAD LEK (MISCELLANEOUS) ×1 IMPLANT
COVER PROBE FLX POLY STRL (MISCELLANEOUS) IMPLANT
DEVICE DUBIN SPECIMEN MAMMOGRA (MISCELLANEOUS) ×2 IMPLANT
DRAPE CHEST BREAST 77X106 FENE (MISCELLANEOUS) ×2 IMPLANT
DRAPE LAPAROTOMY 100X77 ABD (DRAPES) ×2 IMPLANT
DRSG TELFA 4X3 1S NADH ST (GAUZE/BANDAGES/DRESSINGS) ×2 IMPLANT
ELECT CAUTERY BLADE TIP 2.5 (TIP) ×2
ELECT REM PT RETURN 9FT ADLT (ELECTROSURGICAL) ×2
ELECTRODE CAUTERY BLDE TIP 2.5 (TIP) ×1 IMPLANT
ELECTRODE REM PT RTRN 9FT ADLT (ELECTROSURGICAL) ×1 IMPLANT
GAUZE FLUFF 18X24 1PLY STRL (GAUZE/BANDAGES/DRESSINGS) ×2 IMPLANT
GLOVE BIO SURGEON STRL SZ7.5 (GLOVE) ×10 IMPLANT
GLOVE INDICATOR 8.0 STRL GRN (GLOVE) ×2 IMPLANT
GOWN STRL REUS W/ TWL LRG LVL3 (GOWN DISPOSABLE) ×3 IMPLANT
GOWN STRL REUS W/TWL LRG LVL3 (GOWN DISPOSABLE) ×3
KIT RM TURNOVER STRD PROC AR (KITS) ×2 IMPLANT
LABEL OR SOLS (LABEL) ×2 IMPLANT
MARGIN MAP 10MM (MISCELLANEOUS) ×2 IMPLANT
NDL SAFETY 22GX1.5 (NEEDLE) ×2 IMPLANT
NEEDLE HYPO 25X1 1.5 SAFETY (NEEDLE) ×2 IMPLANT
PACK BASIN MINOR ARMC (MISCELLANEOUS) ×2 IMPLANT
STRIP CLOSURE SKIN 1/2X4 (GAUZE/BANDAGES/DRESSINGS) ×4 IMPLANT
SUT ETHILON 3-0 FS-10 30 BLK (SUTURE) ×2
SUT VIC AB 2-0 CT1 27 (SUTURE) ×1
SUT VIC AB 2-0 CT1 TAPERPNT 27 (SUTURE) ×1 IMPLANT
SUT VIC AB 4-0 FS2 27 (SUTURE) ×2 IMPLANT
SUTURE EHLN 3-0 FS-10 30 BLK (SUTURE) ×1 IMPLANT
SWABSTK COMLB BENZOIN TINCTURE (MISCELLANEOUS) ×4 IMPLANT
SYR CONTROL 10ML (SYRINGE) ×2 IMPLANT
TAPE TRANSPORE STRL 2 31045 (GAUZE/BANDAGES/DRESSINGS) ×2 IMPLANT
WATER STERILE IRR 1000ML POUR (IV SOLUTION) ×2 IMPLANT

## 2016-09-13 NOTE — Discharge Instructions (Signed)

## 2016-09-13 NOTE — H&P (Signed)
Distortion on mammography recommended for biopsy. Needle localization completed this AM. Previous "X" clip is not the area of concern.  Procedure reviewed with the patient.

## 2016-09-13 NOTE — OR Nursing (Signed)
Per Dr Bary Castilla, needs to see pt next wed apt, and not the 30th, as he will not be in the office on the 30th.

## 2016-09-13 NOTE — OR Nursing (Signed)
Dr. Bary Castilla in to see pt I postop @ 1354

## 2016-09-13 NOTE — Op Note (Signed)
Preoperative diagnosis: Abnormal mammogram.  Postoperative diagnosis: Same.  Operative procedure: Right breast biopsy with wire localization.  Operating surgeon: Ollen Bowl, M.D.  Anesthesia: Gen. by LMA, Marcaine 0.5% with 1-200,000 epinephrine, 30 mL.  Estimated blood loss: 5 mL.  Clinical note:: This 51 year old woman had an abnormal mammogram biopsy was recommended by the radiology service. 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 The possibility of a complex sclerosing lesion had been suggested by the pathologist and for this reason the radiologist recommended formal excision. The area has shown no change since initial biopsy but the patient is desiring to proceed to surgical excision.  While localization was undertaken by the radiology service with identification of a focal density away from the previously placed biopsy clip at the 6:00 position of the right breast.  Preprocedure review of the provided post-wire localization films suggested the wire coming in from the 12:00 rather than the 6:00 position as clinically evident.  Operative note: The patient tolerated general anesthesia well. The breast was prepped with ChloraPrep and draped care not to dislodge the previously placed localizing wire in the inferior aspect of the breast. Ultrasound was used to identify the of the wire in the area just below the edge of the areola. Local anesthesia was infiltrated for postoperative analgesia. A radial incision was made 6 clock position and carried through through the skin and adipose tissue. Just below the generous layer of adipose tissue a block of breast parenchyma measuring 2 x 3 x 5 cm was excised, orientated and sent for specimen radiography. This concluded the entire thick portion of the biopsy wire and the intact tip.  There was a suggestion of a density on the films. Good hemostasis was noted. A medium Hemoclip was applied to identify the biopsy cavity post procedure. The wound was closed in layers with interrupted 2-0 Vicryl figure-of-eight sutures. This was both done to the layer of the breast parenchyma and to the adipose layer. A running 4-0 Vicryl septic suture was used for the skin. Benzoin, Steri-Strips and Telfa was applied. Fluff gauze and a surgical bra was placed.  The patient tolerated the procedure well and was taken recovery room in stable condition.

## 2016-09-13 NOTE — Anesthesia Postprocedure Evaluation (Signed)
Anesthesia Post Note  Patient: Tyerra Loretto Donaho  Procedure(s) Performed: Procedure(s) (LRB): BREAST BIOPSY WITH NEEDLE LOCALIZATION (Right)  Patient location during evaluation: PACU Anesthesia Type: General Level of consciousness: awake and alert Pain management: pain level controlled Vital Signs Assessment: post-procedure vital signs reviewed and stable Respiratory status: spontaneous breathing, nonlabored ventilation, respiratory function stable and patient connected to nasal cannula oxygen Cardiovascular status: blood pressure returned to baseline and stable Postop Assessment: no signs of nausea or vomiting Anesthetic complications: no     Last Vitals:  Vitals:   09/13/16 1312 09/13/16 1328  BP: (!) 146/89 132/86  Pulse: 71 72  Resp: 15 16  Temp: 36.4 C 36.4 C    Last Pain:  Vitals:   09/13/16 1328  TempSrc: Tympanic  PainSc:                  Martha Clan

## 2016-09-13 NOTE — Anesthesia Post-op Follow-up Note (Cosign Needed)
Anesthesia QCDR form completed.        

## 2016-09-13 NOTE — Anesthesia Procedure Notes (Signed)
Procedure Name: LMA Insertion Date/Time: 09/13/2016 11:38 AM Performed by: Justus Memory Pre-anesthesia Checklist: Patient identified, Patient being monitored, Timeout performed, Emergency Drugs available and Suction available Patient Re-evaluated:Patient Re-evaluated prior to inductionOxygen Delivery Method: Circle system utilized Preoxygenation: Pre-oxygenation with 100% oxygen Intubation Type: IV induction Ventilation: Mask ventilation without difficulty LMA: LMA inserted LMA Size: 3.5 Tube type: Oral Number of attempts: 1 Placement Confirmation: positive ETCO2 and breath sounds checked- equal and bilateral Tube secured with: Tape Dental Injury: Teeth and Oropharynx as per pre-operative assessment

## 2016-09-13 NOTE — Transfer of Care (Signed)
Immediate Anesthesia Transfer of Care Note  Patient: Valerie Manning  Procedure(s) Performed: Procedure(s): BREAST BIOPSY WITH NEEDLE LOCALIZATION (Right)  Patient Location: PACU  Anesthesia Type:General  Level of Consciousness: sedated  Airway & Oxygen Therapy: Patient Spontanous Breathing  Post-op Assessment: Report given to RN and Post -op Vital signs reviewed and stable  Post vital signs: Reviewed and stable  Last Vitals:  Vitals:   09/13/16 0854  BP: (!) 163/104  Pulse: 66  Resp: 18  Temp: (!) 35.9 C    Last Pain:  Vitals:   09/13/16 0854  TempSrc: Tympanic  PainSc: 0-No pain         Complications: No apparent anesthesia complications

## 2016-09-13 NOTE — Anesthesia Preprocedure Evaluation (Signed)
Anesthesia Evaluation  Patient identified by MRN, date of birth, ID band Patient awake    Reviewed: Allergy & Precautions, H&P , NPO status , Patient's Chart, lab work & pertinent test results, reviewed documented beta blocker date and time   History of Anesthesia Complications Negative for: history of anesthetic complications  Airway Mallampati: II  TM Distance: >3 FB Neck ROM: full    Dental  (+) Dental Advidsory Given, Missing, Teeth Intact   Pulmonary neg pulmonary ROS,           Cardiovascular Exercise Tolerance: Good hypertension, (-) angina(-) CAD, (-) Past MI, (-) Cardiac Stents and (-) CABG (-) dysrhythmias (-) Valvular Problems/Murmurs     Neuro/Psych negative neurological ROS  negative psych ROS   GI/Hepatic Neg liver ROS, GERD  ,  Endo/Other  diabetesHypothyroidism   Renal/GU negative Renal ROS  negative genitourinary   Musculoskeletal   Abdominal   Peds  Hematology negative hematology ROS (+)   Anesthesia Other Findings Past Medical History: No date: Cramp in muscle     Comment: H/O OF MUSCLE CRAMP IN CHEST AND STOMACH OCCAS              WHEN BENDS AT WAIST. WHEN STANDS AND BENDS BACK              HEARS POP. CRAMP GOES AWAY. HAS NOT HAPPENES IN              MANY MONTHS No date: Diabetes mellitus without complication (HCC) No date: Hyperlipidemia No date: Hypertension   Reproductive/Obstetrics negative OB ROS                             Anesthesia Physical Anesthesia Plan  ASA: III  Anesthesia Plan: General   Post-op Pain Management:    Induction:   Airway Management Planned:   Additional Equipment:   Intra-op Plan:   Post-operative Plan:   Informed Consent: I have reviewed the patients History and Physical, chart, labs and discussed the procedure including the risks, benefits and alternatives for the proposed anesthesia with the patient or authorized  representative who has indicated his/her understanding and acceptance.   Dental Advisory Given  Plan Discussed with: Anesthesiologist, CRNA and Surgeon  Anesthesia Plan Comments:         Anesthesia Quick Evaluation

## 2016-09-16 LAB — SURGICAL PATHOLOGY

## 2016-09-18 ENCOUNTER — Ambulatory Visit (INDEPENDENT_AMBULATORY_CARE_PROVIDER_SITE_OTHER): Payer: Federal, State, Local not specified - PPO | Admitting: General Surgery

## 2016-09-18 ENCOUNTER — Encounter: Payer: Self-pay | Admitting: General Surgery

## 2016-09-18 VITALS — BP 180/100 | HR 74 | Resp 12 | Ht 66.0 in | Wt 176.0 lb

## 2016-09-18 DIAGNOSIS — R928 Other abnormal and inconclusive findings on diagnostic imaging of breast: Secondary | ICD-10-CM

## 2016-09-18 NOTE — Progress Notes (Signed)
Patient ID: KEYARA ENT, female   DOB: [birth year], 51 y.o.   MRN: [id number]  Chief Complaint  Patient presents with  . Routine Post Op    HPI Valerie Manning is a 51 y.o. female here today for her post right breast biopsy done on 09/13/2016. Patient states she is doing well  HPI  Past Medical History:  Diagnosis Date  . Cramp in muscle    H/O OF MUSCLE CRAMP IN CHEST AND STOMACH OCCAS WHEN BENDS AT WAIST. WHEN STANDS AND BENDS BACK HEARS POP. CRAMP GOES AWAY. HAS NOT HAPPENES IN MANY MONTHS  . Diabetes mellitus without complication ([place])   . Hyperlipidemia   . Hypertension     Past Surgical History:  Procedure Laterality Date  . ABDOMINAL HYSTERECTOMY    . 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 BREAST BIOPSY Right 09/13/2016   Procedure: BREAST BIOPSY WITH NEEDLE LOCALIZATION;  Surgeon: [name], MD;  Location: [facility] ORS;  Service: General;  Laterality: Right;  . BREAST EXCISIONAL BIOPSY Right 06/16/2016   lumpectomy complext sclerosing lesion    Family History  Problem Relation Age of Onset  . Diabetes Mother   . Diabetes Sister   . Heart disease Sister   . Diabetes Sister   . Diabetes Sister   . Thyroid disease Sister   . Diabetes Sister   . Thyroid disease Sister   . Thyroid disease Sister   . Heart disease Father   . Cancer Neg Hx   . Breast cancer Neg Hx     Social History Social History  Substance Use Topics  . Smoking status: Never Smoker  . Smokeless tobacco: Never Used  . Alcohol use No    No Known Allergies  Current Outpatient Prescriptions  Medication Sig Dispense Refill  . aspirin EC 81 MG tablet Take 81 mg by mouth daily.     Marland Kitchen atorvastatin (LIPITOR) 20 MG tablet Take 1 tablet (20 mg total) by mouth daily. 90 tablet 1  . glipiZIDE (GLUCOTROL XL) 5 MG 24 hr tablet Take 5 mg by mouth 2 (two) times daily.   0  . HYDROcodone-acetaminophen (NORCO) 5-325 MG tablet Take 1-2  tablets by mouth every 4 (four) hours as needed for moderate pain. 30 tablet 0  . meloxicam (MOBIC) 15 MG tablet Take 15 mg by mouth daily.    . metFORMIN (GLUCOPHAGE) 500 MG tablet Take 500 mg by mouth 2 (two) times daily with a meal.  0  . omeprazole (PRILOSEC) 20 MG capsule take 1 capsule by mouth once daily 90 capsule 1  . SYNTHROID 150 MCG tablet Take 150 mcg by mouth daily before breakfast.   0  . valsartan-hydrochlorothiazide (DIOVAN-HCT) 160-12.5 MG tablet Take 1 tablet by mouth daily. 90 tablet 1   No current facility-administered medications for this visit.     Review of Systems Review of Systems  Constitutional: Negative.   Respiratory: Negative.     Blood pressure (!) 180/100, pulse 74, resp. rate 12, height 5\' 6"  (1.676 m), weight 176 lb (79.8 kg).  Physical Exam Physical Exam  Constitutional: She is oriented to person, place, and time. She appears well-developed and well-nourished.  HENT:  No intraoral lesions, evidence of infectious complications or fungal infections noted.  Pulmonary/Chest:    Right breast biopsy site is clean and healing well.   Neurological: She is alert and oriented to person, place, and time.  Skin: Skin is  warm and dry.    Data Reviewed 09/13/2016 pathology:    RIGHT BREAST; NEEDLE LOCALIZED EXCISION WITH MAMMOGRAPHY GUIDANCE:  - COMPLEX SCLEROSING LESION, 1.3 CM.  - NEGATIVE FOR ATYPIA AND MALIGNANCY.    06/22/2015 deep biopsy: Breast, right, needle core biopsy, LOQ centrally STROMAL FIBROSIS WITH INTRA ATROPHIC BENIGN GLANDS. SEE COMMENT USUAL DUCTAL HYPERPLASIA FIBROADENOMA AND FIBROCYSTIC CHANGES WITH ASSOCIATED CALCIFICATION Microscopic Comment The differential diagnosis includes previous biopsy sites changes versus complex sclerosing lesions.  06/09/2015 superficial biopsy: A. 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    Plan    Salt water gargles for the reported throat discomfort encouraged.      Patient to return in 10 months bilateral screening mammogram. The patient is aware to call back for any questions or concerns.   HPI, Physical Exam, Assessment and Plan have been scribed under the direction and in the presence of Hervey Ard, MD.  Valerie Manning, CMA    [name] 09/19/2016, 7:24 AM

## 2016-09-18 NOTE — Patient Instructions (Addendum)
Patient to return in 10 months bilateral screening mammogram. The patient is aware to call back for any questions or concerns.

## 2016-09-19 DIAGNOSIS — K080 Exfoliation of teeth due to systemic causes: Secondary | ICD-10-CM | POA: Diagnosis not present

## 2016-10-18 IMAGING — MG MM BREAST BX W/ LOC DEV 1ST LESION IMAGE BX SPEC STEREO GUIDE*R*
5 series · 6 of 25 positions shown · non-contrast
Comparison: Previous exams.

ADDENDUM:
Pathology revealed stromal fibrosis with intra atrophic benign
glands, usual ductal hyperplasia, fibroadenoma and fibrocystic
changes with associated calcifications in the right breast. The
differential diagnosis includes previous biopsy site changes vs.
complex sclerosing lesion . This was found to be concordant by Dr.
Igi Suraci. Pathology results were discussed with the patient by
telephone. The patient reported doing well after the biopsy. Post
biopsy instructions and care were reviewed and questions were
answered. The patient was encouraged to call The [REDACTED] for any additional concerns. The patient desires
surgical consultation in [HOSPITAL]. I have contacted her referring
physician's office, Dr. Fatima Hamdo Aharrar, and spoken with her nurse
Fatovic. She will arrange for a surgical referral and contact the
patient.

Pathology results reported by Do No Jegundo RN, BSN on 06/23/2015.
CLINICAL DATA: Architectural distortion in the posterior, central
right breast slightly laterally and slightly inferiorly, at recent
3D mammography. No ultrasound correlate.
EXAM:
RIGHT BREAST STEREOTACTIC CORE NEEDLE BIOPSY

[R CC tomo · 2 of 69 frames shown (1 of 5)]
[frame 23/69]
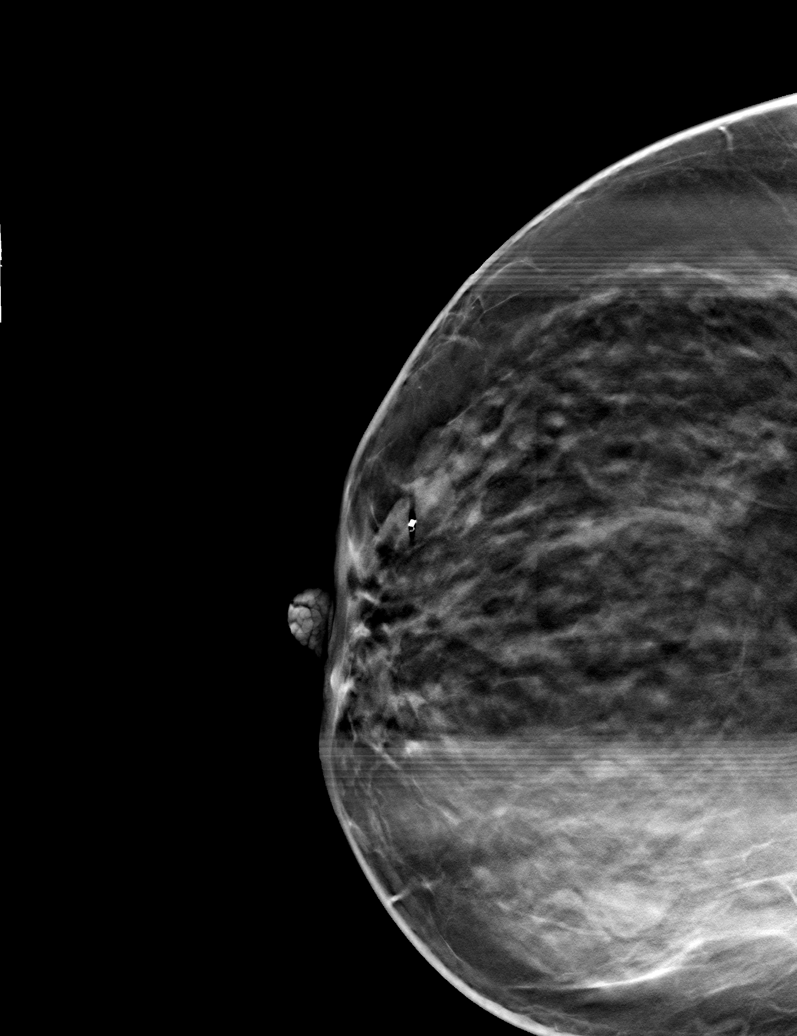
[frame 35/69]
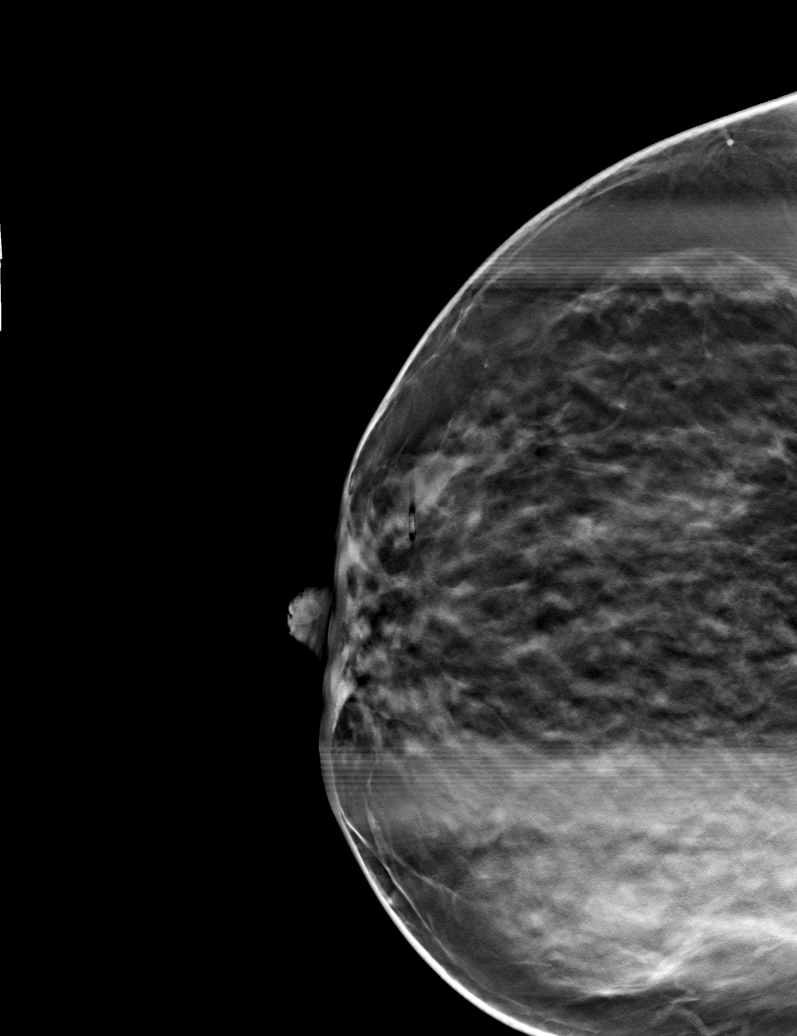

[R CC tomo (2 of 5) · tomo slice 41/81.0]
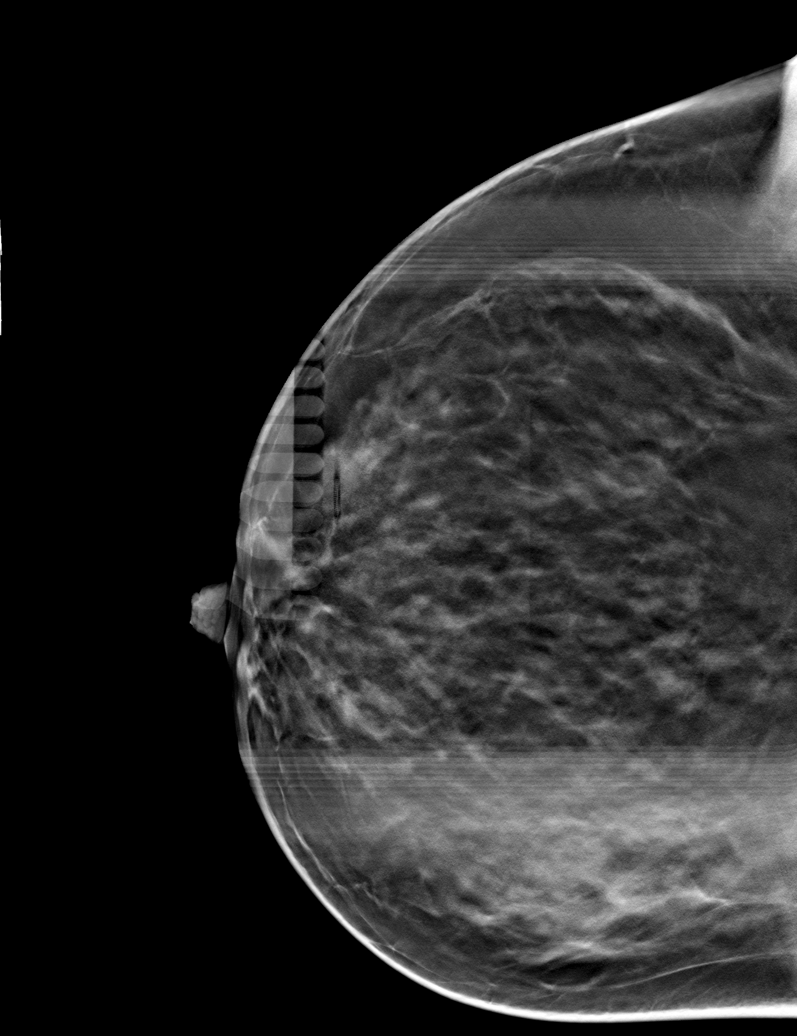

[R CC tomo (3 of 5) · tomo slice 36/71.0]
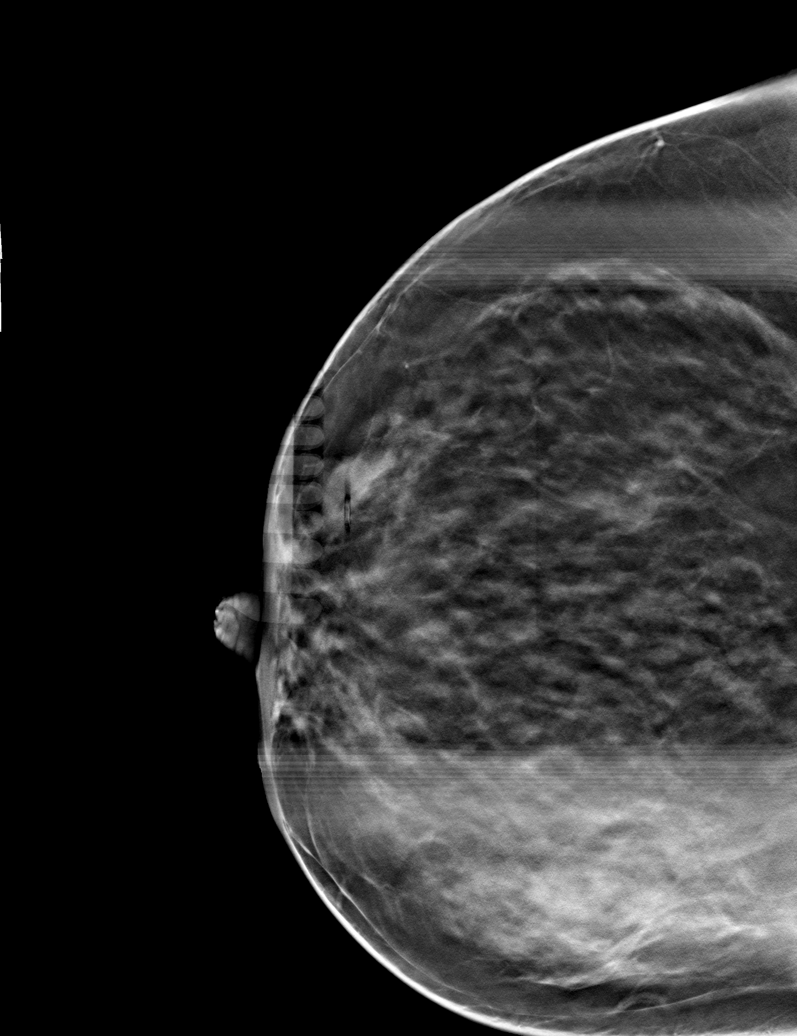

[R CC tomo (4 of 5) · tomo slice 35/68.0]
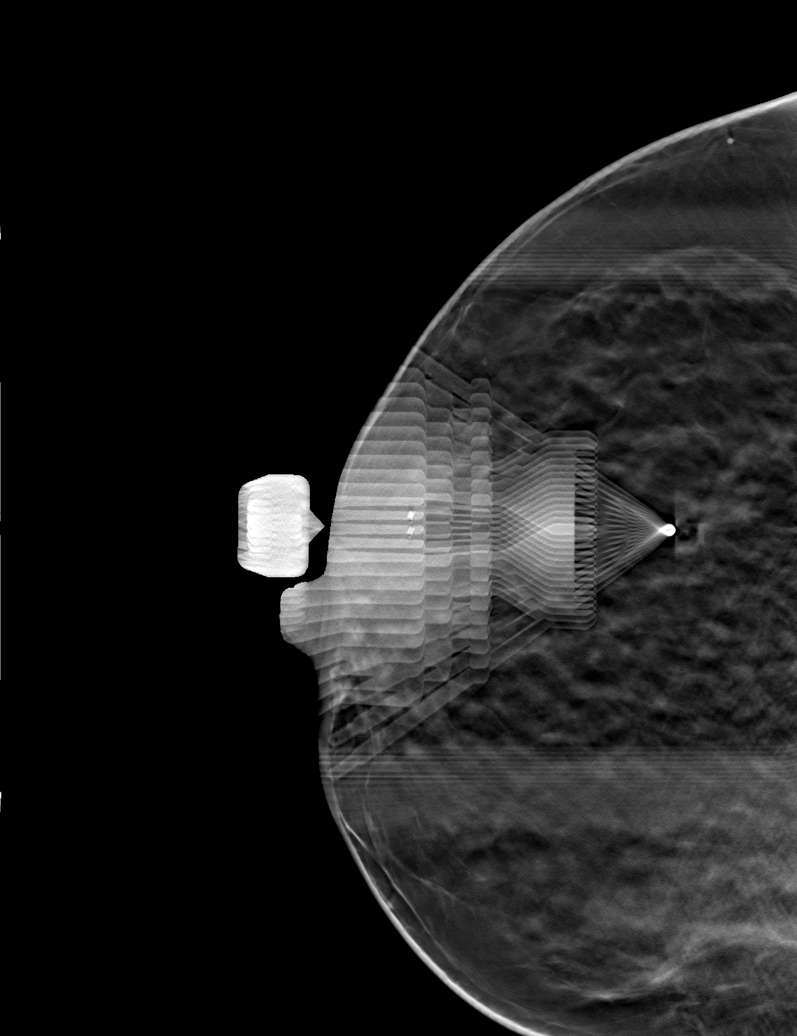

[R CC tomo (5 of 5) · tomo slice 35/68.0]
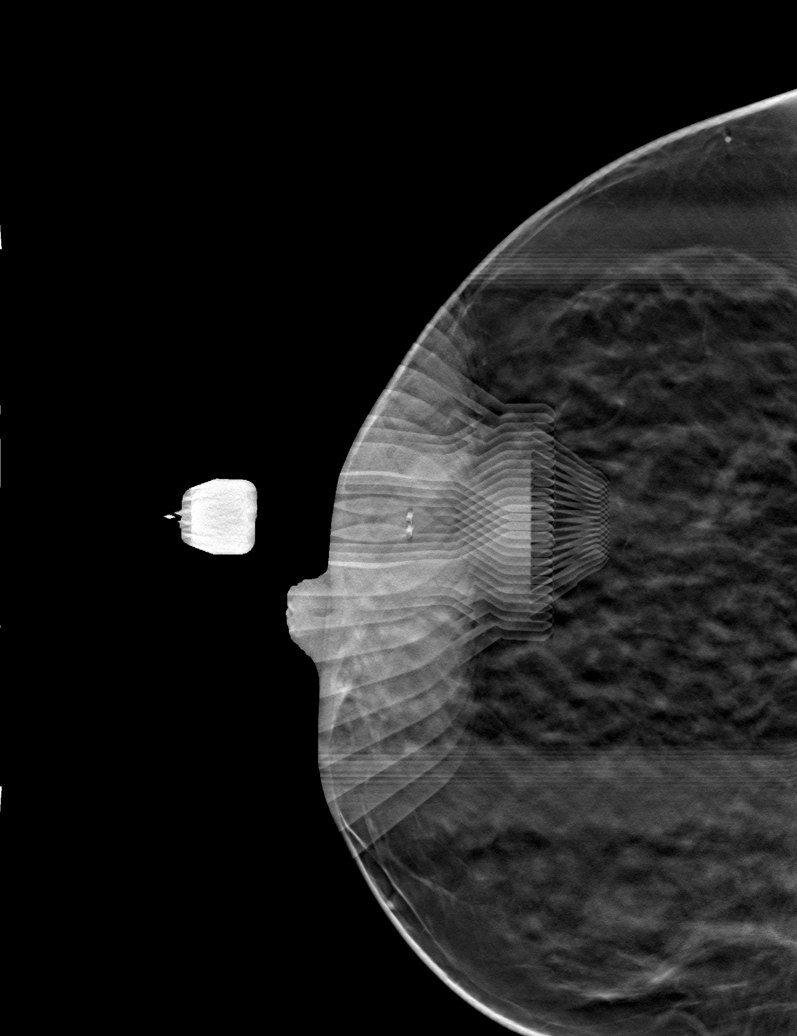

[6 of 25 positions shown; findings below may reference images not displayed]



Using sterile technique and 1% Lidocaine as local anesthetic, under
stereotactic guidance, a 9 gauge vacuum assisted device was used to
perform core needle biopsy of the recently demonstrated
architectural distortion in the central, posterior right breast
slightly laterally and slightly inferiorly using a cephalad
approach.

At the conclusion of the procedure, a X shaped tissue marker clip
was deployed into the biopsy cavity. Follow-up 2-view 3D mammogram
was performed and dictated separately.
IMPRESSION: Stereotactic-guided biopsy of an area of architectural distortion in
the posterior, central right breast slightly inferiorly and slightly
laterally. No apparent complications.

## 2016-10-21 ENCOUNTER — Encounter: Payer: Self-pay | Admitting: Family Medicine

## 2016-10-21 ENCOUNTER — Ambulatory Visit (INDEPENDENT_AMBULATORY_CARE_PROVIDER_SITE_OTHER): Payer: Federal, State, Local not specified - PPO | Admitting: Family Medicine

## 2016-10-21 ENCOUNTER — Other Ambulatory Visit
Admission: RE | Admit: 2016-10-21 | Discharge: 2016-10-21 | Disposition: A | Discharge status: Home / Self Care | Payer: Federal, State, Local not specified - PPO | Source: Ambulatory Visit | Attending: Family Medicine | Admitting: Family Medicine

## 2016-10-21 VITALS — BP 122/84 | HR 87 | Temp 97.6°F | Resp 17 | Ht 66.0 in | Wt 176.0 lb

## 2016-10-21 DIAGNOSIS — E785 Hyperlipidemia, unspecified: Secondary | ICD-10-CM

## 2016-10-21 DIAGNOSIS — K2100 Gastro-esophageal reflux disease with esophagitis, without bleeding: Secondary | ICD-10-CM

## 2016-10-21 DIAGNOSIS — E119 Type 2 diabetes mellitus without complications: Secondary | ICD-10-CM

## 2016-10-21 DIAGNOSIS — I10 Essential (primary) hypertension: Secondary | ICD-10-CM

## 2016-10-21 LAB — LIPID PANEL
CHOLESTEROL: 133 mg/dL (ref 0–200)
HDL: 70 mg/dL (ref >40)
LDL Cholesterol: 53 mg/dL (ref 0–99)
Total CHOL/HDL Ratio: 1.9 RATIO
Triglycerides: 52 mg/dL (ref <150)
VLDL: 10 mg/dL (ref 0–40)

## 2016-10-21 LAB — POCT GLYCOSYLATED HEMOGLOBIN (HGB A1C): Hemoglobin A1C: 6.3

## 2016-10-21 MED ORDER — VALSARTAN-HYDROCHLOROTHIAZIDE 160-12.5 MG PO TABS: 1.0000 | ORAL_TABLET | Freq: Every day | ORAL | 1 refills | Status: DC

## 2016-10-21 MED ORDER — ATORVASTATIN CALCIUM 20 MG PO TABS: 20.0000 mg | ORAL_TABLET | Freq: Every day | ORAL | 1 refills | Status: DC

## 2016-10-21 MED ORDER — OMEPRAZOLE 20 MG PO CPDR: 20.0000 mg | DELAYED_RELEASE_CAPSULE | Freq: Every day | ORAL | 1 refills | Status: DC

## 2016-10-21 NOTE — Progress Notes (Signed)
Name: Valerie Manning   MRN: [id number]    DOB: 17-Feb-1966   Date:10/21/2016       Progress Note  Subjective  Chief Complaint  Chief Complaint  Patient presents with  . Follow-up  . Medication Refill    Hyperlipidemia  This is a chronic problem. The problem is controlled. Recent lipid tests were reviewed and are normal. Exacerbating diseases include diabetes. Pertinent negatives include no chest pain, leg pain, myalgias or shortness of breath. Current antihyperlipidemic treatment includes statins.  Hypertension  This is a chronic problem. The problem is unchanged. The problem is controlled. Pertinent negatives include no blurred vision, chest pain, headaches, palpitations or shortness of breath. Past treatments include angiotensin blockers and diuretics. There is no history of kidney disease, CAD/MI or CVA.  Gastroesophageal Reflux  She reports no abdominal pain, no chest pain, no coughing, no dysphagia, no early satiety or no heartburn. This is a chronic problem. The problem has been unchanged. The symptoms are aggravated by certain foods (spicy foods). She has tried a PPI for the symptoms. Past procedures do not include esophageal manometry.     Past Medical History:  Diagnosis Date  . Cramp in muscle    H/O OF MUSCLE CRAMP IN CHEST AND STOMACH OCCAS WHEN BENDS AT WAIST. WHEN STANDS AND BENDS BACK HEARS POP. CRAMP GOES AWAY. HAS NOT HAPPENES IN MANY MONTHS  . Diabetes mellitus without complication ([place])   . Hyperlipidemia   . Hypertension     Past Surgical History:  Procedure Laterality Date  . ABDOMINAL HYSTERECTOMY    . 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 BREAST BIOPSY Right 09/13/2016   Procedure: BREAST BIOPSY WITH NEEDLE LOCALIZATION;  Surgeon: [name], MD;  Location: [facility] ORS;  Service: General;  Laterality: Right;  . BREAST EXCISIONAL BIOPSY Right 06/16/2016   lumpectomy complext sclerosing  lesion    Family History  Problem Relation Age of Onset  . Diabetes Mother   . Diabetes Sister   . Heart disease Sister   . Diabetes Sister   . Diabetes Sister   . Thyroid disease Sister   . Diabetes Sister   . Thyroid disease Sister   . Thyroid disease Sister   . Heart disease Father   . Cancer Neg Hx   . Breast cancer Neg Hx     Social History   Social History  . Marital status: Married    Spouse name: N/A  . Number of children: N/A  . Years of education: N/A   Occupational History  . Not on file.   Social History Main Topics  . Smoking status: Never Smoker  . Smokeless tobacco: Never Used  . Alcohol use No  . Drug use: No  . Sexual activity: Yes    Birth control/ protection: Surgical   Other Topics Concern  . Not on file   Social History Narrative  . No narrative on file     Current Outpatient Prescriptions:  .  aspirin EC 81 MG tablet, Take 81 mg by mouth daily. , Disp: , Rfl:  .  atorvastatin (LIPITOR) 20 MG tablet, Take 1 tablet (20 mg total) by mouth daily., Disp: 90 tablet, Rfl: 1 .  glipiZIDE (GLUCOTROL XL) 5 MG 24 hr tablet, Take 5 mg by mouth 2 (two) times daily. , Disp: , Rfl: 0 .  HYDROcodone-acetaminophen (NORCO) 5-325 MG tablet, Take 1-2 tablets by mouth every 4 (four) hours as  needed for moderate pain., Disp: 30 tablet, Rfl: 0 .  meloxicam (MOBIC) 15 MG tablet, Take 15 mg by mouth daily., Disp: , Rfl:  .  metFORMIN (GLUCOPHAGE) 500 MG tablet, Take 500 mg by mouth 2 (two) times daily with a meal., Disp: , Rfl: 0 .  omeprazole (PRILOSEC) 20 MG capsule, take 1 capsule by mouth once daily, Disp: 90 capsule, Rfl: 1 .  SYNTHROID 150 MCG tablet, Take 150 mcg by mouth daily before breakfast. , Disp: , Rfl: 0 .  valsartan-hydrochlorothiazide (DIOVAN-HCT) 160-12.5 MG tablet, Take 1 tablet by mouth daily., Disp: 90 tablet, Rfl: 1  No Known Allergies   Review of Systems  Eyes: Negative for blurred vision.  Respiratory: Negative for cough and shortness  of breath.   Cardiovascular: Negative for chest pain and palpitations.  Gastrointestinal: Negative for abdominal pain, dysphagia and heartburn.  Musculoskeletal: Negative for myalgias.  Neurological: Negative for headaches.     Objective  Vitals:   10/21/16 0929  BP: 122/84  Pulse: 87  Resp: 17  Temp: 97.6 F (36.4 C)  TempSrc: Oral  SpO2: 96%  Weight: 176 lb (79.8 kg)  Height: 5\' 6"  (1.676 m)    Physical Exam  Constitutional: She is oriented to person, place, and time and well-developed, well-nourished, and in no distress.  Cardiovascular: Normal rate, regular rhythm, S1 normal, S2 normal and normal heart sounds.   No murmur heard. Pulmonary/Chest: Effort normal and breath sounds normal. She has no wheezes. She has no rhonchi.  Abdominal: Soft. Bowel sounds are normal. There is no tenderness.  Musculoskeletal:       Right ankle: She exhibits no swelling.       Left ankle: She exhibits no swelling.  Neurological: She is alert and oriented to person, place, and time.  Psychiatric: Mood, memory, affect and judgment normal.  Nursing note and vitals reviewed.   Assessment & Plan  1. Type 2 diabetes mellitus without complication, without long-term current use of insulin (HCC)   A1c 6.3%, well-controlled diabetes, being managed by endocrinology - POCT HgB A1C  2. Essential hypertension Stable on present anti-hypertensive therapy - valsartan-hydrochlorothiazide (DIOVAN-HCT) 160-12.5 MG tablet; Take 1 tablet by mouth daily.  Dispense: 90 tablet; Refill: 1  3. Hyperlipidemia, unspecified hyperlipidemia type  - atorvastatin (LIPITOR) 20 MG tablet; Take 1 tablet (20 mg total) by mouth daily.  Dispense: 90 tablet; Refill: 1 - Lipid panel  4. Reflux esophagitis  - omeprazole (PRILOSEC) 20 MG capsule; Take 1 capsule (20 mg total) by mouth daily.  Dispense: 90 capsule; Refill: 1   Antwaun Buth Asad A. [facility] 10/21/2016 9:56  AM

## 2016-10-29 ENCOUNTER — Encounter: Payer: Federal, State, Local not specified - PPO | Admitting: Obstetrics and Gynecology

## 2016-10-29 NOTE — Progress Notes (Signed)
Patient ID: Valerie Manning, female   DOB: Dec 07, 1965, 51 y.o.   MRN: [id number] ANNUAL PREVENTATIVE CARE GYN  ENCOUNTER NOTE  Subjective:       Valerie Manning is a 51 y.o. G42P2002 female here for a routine annual gynecologic exam.  Current complaints: 1.  S/p TAH LSO R salpingectomy; denies vaginal dryness, night sweats, and hot flashes. 2. Abnormal mammogram-biopsy performed of the right breast; being followed by Dr. 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 Follow-up mammograms and breast health care is through Dr. Tollie Pizza  Patient experienced some abdominal pelvic pain for 2 days a few weeks ago; none today. Bowel and bladder function are normal. Niece passed away on 08/14/22; significant family stress is noted due to the patient's niece leaving 5 children 4 care with extended family BP is elevated- pt c/o of h/a since last nite; on bp meds   Gynecologic History No LMP recorded. Patient has had a hysterectomy. Contraception: status post hysterectomy Last Pap: n/a. Results were: n/a Last mammogram: 2017. Results were: abnormal  Obstetric History OB History  Gravida Para Term Preterm AB Living  2         2  SAB TAB Ectopic Multiple Live Births               # Outcome Date GA Lbr Len/2nd Weight Sex Delivery Anes PTL Lv  2 Gravida           1 Gravida             Obstetric Comments  1st Menstrual Cycle:  13  1st Pregnancy:  26    Past Medical History:  Diagnosis Date  . Cramp in muscle    H/O OF MUSCLE CRAMP IN CHEST AND STOMACH OCCAS WHEN BENDS AT WAIST. WHEN STANDS AND BENDS BACK HEARS POP. CRAMP GOES AWAY. HAS NOT HAPPENES IN MANY MONTHS  . Diabetes mellitus without complication ([place])   . Hyperlipidemia   . Hypertension     Past Surgical History:  Procedure Laterality Date  . ABDOMINAL HYSTERECTOMY    . 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 BREAST  BIOPSY Right 09/13/2016   Procedure: BREAST BIOPSY WITH NEEDLE LOCALIZATION;  Surgeon: [name], MD;  Location: [facility] ORS;  Service: General;  Laterality: Right;  . BREAST EXCISIONAL BIOPSY Right 06/16/2016   lumpectomy complext sclerosing lesion    Current Outpatient Prescriptions on File Prior to Visit  Medication Sig Dispense Refill  . aspirin EC 81 MG tablet Take 81 mg by mouth daily.     Marland Kitchen atorvastatin (LIPITOR) 20 MG tablet Take 1 tablet (20 mg total) by mouth daily. 90 tablet 1  . glipiZIDE (GLUCOTROL XL) 5 MG 24 hr tablet Take 5 mg by mouth 2 (two) times daily.   0  . HYDROcodone-acetaminophen (NORCO) 5-325 MG tablet Take 1-2 tablets by mouth every 4 (four) hours as needed for moderate pain. 30 tablet 0  . meloxicam (MOBIC) 15 MG tablet Take 15 mg by mouth daily.    . metFORMIN (GLUCOPHAGE) 500 MG tablet Take 500 mg by mouth 2 (two) times daily with a meal.  0  . omeprazole (PRILOSEC) 20 MG capsule Take 1 capsule (20 mg total) by mouth daily. 90 capsule 1  . SYNTHROID 150 MCG tablet Take 150 mcg by mouth daily before breakfast.   0  . valsartan-hydrochlorothiazide (DIOVAN-HCT) 160-12.5 MG tablet  Take 1 tablet by mouth daily. 90 tablet 1   No current facility-administered medications on file prior to visit.     No Known Allergies  Social History   Social History  . Marital status: Married    Spouse name: N/A  . Number of children: N/A  . Years of education: N/A   Occupational History  . Not on file.   Social History Main Topics  . Smoking status: Never Smoker  . Smokeless tobacco: Never Used  . Alcohol use No  . Drug use: No  . Sexual activity: Yes    Birth control/ protection: Surgical   Other Topics Concern  . Not on file   Social History Narrative  . No narrative on file    Family History  Problem Relation Age of Onset  . Diabetes Mother   . Diabetes Sister   . Heart disease Sister   . Diabetes Sister   . Diabetes Sister   . Thyroid disease  Sister   . Diabetes Sister   . Thyroid disease Sister   . Thyroid disease Sister   . Heart disease Father   . Cancer Neg Hx   . Breast cancer Neg Hx     The following portions of the patient's history were reviewed and updated as appropriate: allergies, current medications, past family history, past medical history, past social history, past surgical history and problem list.  Review of Systems Review of Systems  Constitutional: Negative.        Occasional hot flashes are noted; no vaginal dryness  HENT: Negative.   Eyes: Negative.   Respiratory: Negative.   Cardiovascular: Negative.   Gastrointestinal: Negative.   Genitourinary: Negative.   Musculoskeletal: Negative.   Skin: Negative.   Neurological: Positive for headaches.       Mildly elevated blood pressure today; patient is  followed by Dr. Brigitte Pulse  Endo/Heme/Allergies: Negative.   Psychiatric/Behavioral: Negative.        Family stressors ongoing with death of niece, age 52 from stroke complications     Objective:   BP (!) 155/100   Pulse 80   Ht 5\' 6"  (1.676 m)   Wt 173 lb 12.8 oz (78.8 kg)   BMI 28.05 kg/m  CONSTITUTIONAL: Well-developed, well-nourished female in no acute distress.  PSYCHIATRIC: Normal mood and affect. Normal behavior. Normal judgment and thought content. [place]: Alert and oriented to person, place, and time. Normal muscle tone coordination. No cranial nerve deficit noted. HENT:  Normocephalic, atraumatic, External right and left ear normal. Oropharynx is clear and moist EYES: Conjunctivae and EOM are normal. No scleral icterus.  NECK: Normal range of motion, supple, no masses.  Normal thyroid.  SKIN: Skin is warm and dry. No rash noted. Not diaphoretic. No erythema. No pallor. CARDIOVASCULAR: Normal heart rate noted, regular rhythm, no murmur. RESPIRATORY: Clear to auscultation bilaterally. Effort and breath sounds normal, no problems with respiration noted. BREASTS: Symmetric in size. No  masses, skin changes, nipple drainage, or lymphadenopathy. 4 cm biopsy scar noted on right breast,Well-healed ABDOMEN: Soft, normal bowel sounds, no distention noted.  No tenderness, rebound or guarding.  BLADDER: Normal PELVIC:  External Genitalia: Normal  BUS: Normal  Vagina: Normal, secretions adequate  Cervix: Surgically absent  Uterus: Surgically absent  Adnexa: Normal, nonpalpable, nontender  RV: External hemorrhoids, nonthrombosed., No Rectal Masses and Normal Sphincter tone  MUSCULOSKELETAL: Normal range of motion. No tenderness.  No cyanosis, clubbing, or edema.  2+ distal pulses. LYMPHATIC: No Axillary, Supraclavicular, or Inguinal Adenopathy.  Assessment:   Annual gynecologic examination 51 y.o. Contraception: status post hysterectomy BMI- 28 Problem List Items Addressed This Visit    Status post abdominal hysterectomy and left salpingo-oophorectomy   Increased BMI    Other Visit Diagnoses    Well woman exam    -  Primary   Screen for colon cancer         Family stressors from death of niece Elevated blood pressure today Plan:  Pap: Not needed Mammogram: thru dr brynett Stool Guaiac Testing:  Ordered Labs: thru pcp Shannon is obtained today to assess ovarian function Routine preventative health maintenance measures emphasized: Exercise/Diet/Weight control, Tobacco Warnings and Alcohol/Substance use risks  1. Return to [place] 2. Will defer follow-up mammorgrams to Dr. Bary Castilla 3. Advised pilates and core exercises for weight loss 4. Patient is to get her blood pressure monitored at work at least weekly and if consistently elevated, return to primary care for further management  Joyice Faster, CMA  Brayton Mars, MD  Note: This dictation was prepared with Dragon dictation along with smaller phrase technology. Any transcriptional errors that result from this process are unintentional.

## 2016-11-04 ENCOUNTER — Ambulatory Visit: Payer: Federal, State, Local not specified - PPO | Admitting: Family Medicine

## 2016-11-05 DIAGNOSIS — E032 Hypothyroidism due to medicaments and other exogenous substances: Secondary | ICD-10-CM | POA: Diagnosis not present

## 2016-11-05 DIAGNOSIS — E039 Hypothyroidism, unspecified: Secondary | ICD-10-CM | POA: Diagnosis not present

## 2016-11-05 DIAGNOSIS — E1169 Type 2 diabetes mellitus with other specified complication: Secondary | ICD-10-CM | POA: Diagnosis not present

## 2016-11-05 DIAGNOSIS — E119 Type 2 diabetes mellitus without complications: Secondary | ICD-10-CM | POA: Diagnosis not present

## 2016-11-05 DIAGNOSIS — I158 Other secondary hypertension: Secondary | ICD-10-CM | POA: Diagnosis not present

## 2016-11-05 DIAGNOSIS — E781 Pure hyperglyceridemia: Secondary | ICD-10-CM | POA: Diagnosis not present

## 2016-11-05 DIAGNOSIS — I10 Essential (primary) hypertension: Secondary | ICD-10-CM | POA: Diagnosis not present

## 2016-11-05 DIAGNOSIS — E784 Other hyperlipidemia: Secondary | ICD-10-CM | POA: Diagnosis not present

## 2016-11-07 ENCOUNTER — Encounter: Payer: Self-pay | Admitting: Obstetrics and Gynecology

## 2016-11-07 ENCOUNTER — Ambulatory Visit (INDEPENDENT_AMBULATORY_CARE_PROVIDER_SITE_OTHER): Payer: Federal, State, Local not specified - PPO | Admitting: Obstetrics and Gynecology

## 2016-11-07 VITALS — BP 155/100 | HR 80 | Ht 66.0 in | Wt 173.8 lb

## 2016-11-07 DIAGNOSIS — Z9079 Acquired absence of other genital organ(s): Secondary | ICD-10-CM | POA: Diagnosis not present

## 2016-11-07 DIAGNOSIS — N951 Menopausal and female climacteric states: Secondary | ICD-10-CM

## 2016-11-07 DIAGNOSIS — R102 Pelvic and perineal pain: Secondary | ICD-10-CM

## 2016-11-07 DIAGNOSIS — R638 Other symptoms and signs concerning food and fluid intake: Secondary | ICD-10-CM

## 2016-11-07 DIAGNOSIS — Z1211 Encounter for screening for malignant neoplasm of colon: Secondary | ICD-10-CM | POA: Diagnosis not present

## 2016-11-07 DIAGNOSIS — Z01419 Encounter for gynecological examination (general) (routine) without abnormal findings: Secondary | ICD-10-CM

## 2016-11-07 DIAGNOSIS — Z90710 Acquired absence of both cervix and uterus: Secondary | ICD-10-CM

## 2016-11-07 DIAGNOSIS — Z90721 Acquired absence of ovaries, unilateral: Secondary | ICD-10-CM | POA: Diagnosis not present

## 2016-11-07 LAB — POCT URINALYSIS DIPSTICK
BILIRUBIN UA: NEGATIVE
KETONE: NEGATIVE
Leukocytes, UA: NEGATIVE
NITRITE UA: NEGATIVE
PH UA: 6.5 (ref 5.0–8.0)
Protein, UA: NEGATIVE
RBC UA: NEGATIVE
Spec Grav, UA: 1.01 (ref 1.010–1.025)
Urobilinogen, UA: 0.2 E.U./dL

## 2016-11-07 NOTE — Patient Instructions (Addendum)
1. No Pap smear is needed 2. Mammogram is to be obtained through Dr. Marlyn Corporal 3. Stool guaiac cards are given for colon cancer screening 4. Screening labs are to be obtained through primary care; [facility] is ordered today to assess ovarian function 5. Continue with healthy eating and exercise and controlled weight loss 6. Recommend calcium and vitamin D supplementation-600 mg calcium twice a day and 400 international units of vitamin D twice a day 7. Return in 1 year for annual exam   Health Maintenance for Postmenopausal Women Menopause is a normal process in which your reproductive ability comes to an end. This process happens gradually over a span of months to years, usually between the ages of 68 and 46. Menopause is complete when you have missed 12 consecutive menstrual periods. It is important to talk with your health care provider about some of the most common conditions that affect postmenopausal women, such as heart disease, cancer, and bone loss (osteoporosis). Adopting a healthy lifestyle and getting preventive care can help to promote your health and wellness. Those actions can also lower your chances of developing some of these common conditions. What should I know about menopause? During menopause, you may experience a number of symptoms, such as:  Moderate-to-severe hot flashes.  Night sweats.  Decrease in sex drive.  Mood swings.  Headaches.  Tiredness.  Irritability.  Memory problems.  Insomnia.  Choosing to treat or not to treat menopausal changes is an individual decision that you make with your health care provider. What should I know about hormone replacement therapy and supplements? Hormone therapy products are effective for treating symptoms that are associated with menopause, such as hot flashes and night sweats. Hormone replacement carries certain risks, especially as you become older. If you are thinking about using estrogen or estrogen with progestin  treatments, discuss the benefits and risks with your health care provider. What should I know about heart disease and stroke? Heart disease, heart attack, and stroke become more likely as you age. This may be due, in part, to the hormonal changes that your body experiences during menopause. These can affect how your body processes dietary fats, triglycerides, and cholesterol. Heart attack and stroke are both medical emergencies. There are many things that you can do to help prevent heart disease and stroke:  Have your blood pressure checked at least every 1-2 years. High blood pressure causes heart disease and increases the risk of stroke.  If you are 83-58 years old, ask your health care provider if you should take aspirin to prevent a heart attack or a stroke.  Do not use any tobacco products, including cigarettes, chewing tobacco, or electronic cigarettes. If you need help quitting, ask your health care provider.  It is important to eat a healthy diet and maintain a healthy weight. ? Be sure to include plenty of vegetables, fruits, low-fat dairy products, and lean protein. ? Avoid eating foods that are high in solid fats, added sugars, or salt (sodium).  Get regular exercise. This is one of the most important things that you can do for your health. ? Try to exercise for at least 150 minutes each week. The type of exercise that you do should increase your heart rate and make you sweat. This is known as moderate-intensity exercise. ? Try to do strengthening exercises at least twice each week. Do these in addition to the moderate-intensity exercise.  Know your numbers.Ask your health care provider to check your cholesterol and your blood glucose. Continue to  have your blood tested as directed by your health care provider.  What should I know about cancer screening? There are several types of cancer. Take the following steps to reduce your risk and to catch any cancer development as early as  possible. Breast Cancer  Practice breast self-awareness. ? This means understanding how your breasts normally appear and feel. ? It also means doing regular breast self-exams. Let your health care provider know about any changes, no matter how small.  If you are 55 or older, have a clinician do a breast exam (clinical breast exam or CBE) every year. Depending on your age, family history, and medical history, it may be recommended that you also have a yearly breast X-ray (mammogram).  If you have a family history of breast cancer, talk with your health care provider about genetic screening.  If you are at high risk for breast cancer, talk with your health care provider about having an MRI and a mammogram every year.  Breast cancer (BRCA) gene test is recommended for women who have family members with BRCA-related cancers. Results of the assessment will determine the need for genetic counseling and BRCA1 and for BRCA2 testing. BRCA-related cancers include these types: ? Breast. This occurs in males or females. ? Ovarian. ? Tubal. This may also be called fallopian tube cancer. ? Cancer of the abdominal or pelvic lining (peritoneal cancer). ? Prostate. ? Pancreatic.  Cervical, Uterine, and Ovarian Cancer Your health care provider may recommend that you be screened regularly for cancer of the pelvic organs. These include your ovaries, uterus, and vagina. This screening involves a pelvic exam, which includes checking for microscopic changes to the surface of your cervix (Pap test).  For women ages 21-65, health care providers may recommend a pelvic exam and a Pap test every three years. For women ages 40-65, they may recommend the Pap test and pelvic exam, combined with testing for human papilloma virus (HPV), every five years. Some types of HPV increase your risk of cervical cancer. Testing for HPV may also be done on women of any age who have unclear Pap test results.  Other health care  providers may not recommend any screening for nonpregnant women who are considered low risk for pelvic cancer and have no symptoms. Ask your health care provider if a screening pelvic exam is right for you.  If you have had past treatment for cervical cancer or a condition that could lead to cancer, you need Pap tests and screening for cancer for at least 20 years after your treatment. If Pap tests have been discontinued for you, your risk factors (such as having a new sexual partner) need to be reassessed to determine if you should start having screenings again. Some women have medical problems that increase the chance of getting cervical cancer. In these cases, your health care provider may recommend that you have screening and Pap tests more often.  If you have a family history of uterine cancer or ovarian cancer, talk with your health care provider about genetic screening.  If you have vaginal bleeding after reaching menopause, tell your health care provider.  There are currently no reliable tests available to screen for ovarian cancer.  Lung Cancer Lung cancer screening is recommended for adults 65-29 years old who are at high risk for lung cancer because of a history of smoking. A yearly low-dose CT scan of the lungs is recommended if you:  Currently smoke.  Have a history of at least 30 pack-years  of smoking and you currently smoke or have quit within the past 15 years. A pack-year is smoking an average of one pack of cigarettes per day for one year.  Yearly screening should:  Continue until it has been 15 years since you quit.  Stop if you develop a health problem that would prevent you from having lung cancer treatment.  Colorectal Cancer  This type of cancer can be detected and can often be prevented.  Routine colorectal cancer screening usually begins at age 60 and continues through age 53.  If you have risk factors for colon cancer, your health care provider may recommend  that you be screened at an earlier age.  If you have a family history of colorectal cancer, talk with your health care provider about genetic screening.  Your health care provider may also recommend using home test kits to check for hidden blood in your stool.  A small camera at the end of a tube can be used to examine your colon directly (sigmoidoscopy or colonoscopy). This is done to check for the earliest forms of colorectal cancer.  Direct examination of the colon should be repeated every 5-10 years until age 60. However, if early forms of precancerous polyps or small growths are found or if you have a family history or genetic risk for colorectal cancer, you may need to be screened more often.  Skin Cancer  Check your skin from head to toe regularly.  Monitor any moles. Be sure to tell your health care provider: ? About any new moles or changes in moles, especially if there is a change in a mole's shape or color. ? If you have a mole that is larger than the size of a pencil eraser.  If any of your family members has a history of skin cancer, especially at a young age, talk with your health care provider about genetic screening.  Always use sunscreen. Apply sunscreen liberally and repeatedly throughout the day.  Whenever you are outside, protect yourself by wearing long sleeves, pants, a wide-brimmed hat, and sunglasses.  What should I know about osteoporosis? Osteoporosis is a condition in which bone destruction happens more quickly than new bone creation. After menopause, you may be at an increased risk for osteoporosis. To help prevent osteoporosis or the bone fractures that can happen because of osteoporosis, the following is recommended:  If you are 19-68 years old, get at least 1,000 mg of calcium and at least 600 mg of vitamin D per day.  If you are older than age 42 but younger than age 81, get at least 1,200 mg of calcium and at least 600 mg of vitamin D per day.  If you  are older than age 41, get at least 1,200 mg of calcium and at least 800 mg of vitamin D per day.  Smoking and excessive alcohol intake increase the risk of osteoporosis. Eat foods that are rich in calcium and vitamin D, and do weight-bearing exercises several times each week as directed by your health care provider. What should I know about how menopause affects my mental health? Depression may occur at any age, but it is more common as you become older. Common symptoms of depression include:  Low or sad mood.  Changes in sleep patterns.  Changes in appetite or eating patterns.  Feeling an overall lack of motivation or enjoyment of activities that you previously enjoyed.  Frequent crying spells.  Talk with your health care provider if you think that you  are experiencing depression. What should I know about immunizations? It is important that you get and maintain your immunizations. These include:  Tetanus, diphtheria, and pertussis (Tdap) booster vaccine.  Influenza every year before the flu season begins.  Pneumonia vaccine.  Shingles vaccine.  Your health care provider may also recommend other immunizations. This information is not intended to replace advice given to you by your health care provider. Make sure you discuss any questions you have with your health care provider. Document Released: 06/07/2005 Document Revised: 11/03/2015 Document Reviewed: 01/17/2015 Elsevier Interactive Patient Education  2018 [employer].

## 2016-11-08 LAB — FOLLICLE STIMULATING HORMONE: FSH: 61.2 m[IU]/mL

## 2016-11-09 LAB — URINE CULTURE: Organism ID, Bacteria: NO GROWTH

## 2016-11-12 DIAGNOSIS — Z1211 Encounter for screening for malignant neoplasm of colon: Secondary | ICD-10-CM | POA: Diagnosis not present

## 2016-11-14 LAB — FECAL OCCULT BLOOD, IMMUNOCHEMICAL: FECAL OCCULT BLD: NEGATIVE

## 2016-12-03 LAB — HM DIABETES EYE EXAM

## 2016-12-05 ENCOUNTER — Encounter: Payer: Self-pay | Admitting: Family Medicine

## 2017-01-09 DIAGNOSIS — K080 Exfoliation of teeth due to systemic causes: Secondary | ICD-10-CM | POA: Diagnosis not present

## 2017-01-21 ENCOUNTER — Ambulatory Visit: Payer: Federal, State, Local not specified - PPO | Admitting: Family Medicine

## 2017-01-27 ENCOUNTER — Ambulatory Visit (INDEPENDENT_AMBULATORY_CARE_PROVIDER_SITE_OTHER): Payer: Federal, State, Local not specified - PPO | Admitting: Family Medicine

## 2017-01-27 ENCOUNTER — Encounter: Payer: Self-pay | Admitting: Family Medicine

## 2017-01-27 DIAGNOSIS — E785 Hyperlipidemia, unspecified: Secondary | ICD-10-CM | POA: Diagnosis not present

## 2017-01-27 DIAGNOSIS — K2100 Gastro-esophageal reflux disease with esophagitis, without bleeding: Secondary | ICD-10-CM

## 2017-01-27 DIAGNOSIS — I10 Essential (primary) hypertension: Secondary | ICD-10-CM | POA: Diagnosis not present

## 2017-01-27 MED ORDER — OMEPRAZOLE 20 MG PO CPDR: 20.0000 mg | DELAYED_RELEASE_CAPSULE | Freq: Every day | ORAL | 1 refills | Status: DC

## 2017-01-27 MED ORDER — ATORVASTATIN CALCIUM 20 MG PO TABS: 20.0000 mg | ORAL_TABLET | Freq: Every day | ORAL | 1 refills | Status: DC

## 2017-01-27 MED ORDER — VALSARTAN-HYDROCHLOROTHIAZIDE 160-12.5 MG PO TABS: 1.0000 | ORAL_TABLET | Freq: Every day | ORAL | 1 refills | Status: DC

## 2017-01-27 NOTE — Progress Notes (Signed)
Name: Valerie Manning   MRN: [id number]    DOB: Jul 20, 1965   Date:01/27/2017       Progress Note  Subjective  Chief Complaint  Chief Complaint  Patient presents with  . Medication Refill    atorvastatin 20mg , omperazole 20mg , diovan 12.5mg  160mg      Hyperlipidemia  This is a chronic problem. The problem is controlled. Recent lipid tests were reviewed and are normal. Exacerbating diseases include diabetes. Pertinent negatives include no chest pain, leg pain, myalgias or shortness of breath. Current antihyperlipidemic treatment includes statins.  Hypertension  This is a chronic problem. The problem is unchanged. The problem is controlled. Pertinent negatives include no blurred vision, chest pain, headaches, palpitations or shortness of breath. Past treatments include angiotensin blockers and diuretics. There is no history of kidney disease, CAD/MI or CVA.  Gastroesophageal Reflux  She reports no abdominal pain, no chest pain, no coughing, no dysphagia, no early satiety or no heartburn. This is a chronic problem. The problem has been unchanged. The symptoms are aggravated by certain foods (spicy foods). She has tried a PPI for the symptoms. Past procedures do not include esophageal manometry.     Past Medical History:  Diagnosis Date  . Cramp in muscle    H/O OF MUSCLE CRAMP IN CHEST AND STOMACH OCCAS WHEN BENDS AT WAIST. WHEN STANDS AND BENDS BACK HEARS POP. CRAMP GOES AWAY. HAS NOT HAPPENES IN MANY MONTHS  . Diabetes mellitus without complication ([place])   . Hyperlipidemia   . Hypertension     Past Surgical History:  Procedure Laterality Date  . ABDOMINAL HYSTERECTOMY    . 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 BREAST BIOPSY Right 09/13/2016   Procedure: BREAST BIOPSY WITH NEEDLE LOCALIZATION;  Surgeon: [name], MD;  Location: [facility] ORS;  Service: General;  Laterality: Right;  . BREAST EXCISIONAL BIOPSY Right  06/16/2016   lumpectomy complext sclerosing lesion    Family History  Problem Relation Age of Onset  . Diabetes Mother   . Diabetes Sister   . Heart disease Sister   . Diabetes Sister   . Diabetes Sister   . Thyroid disease Sister   . Diabetes Sister   . Thyroid disease Sister   . Thyroid disease Sister   . Heart disease Father   . Cancer Neg Hx   . Breast cancer Neg Hx     Social History   Social History  . Marital status: Married    Spouse name: N/A  . Number of children: N/A  . Years of education: N/A   Occupational History  . Not on file.   Social History Main Topics  . Smoking status: Never Smoker  . Smokeless tobacco: Never Used  . Alcohol use No  . Drug use: No  . Sexual activity: Yes    Birth control/ protection: Surgical   Other Topics Concern  . Not on file   Social History Narrative  . No narrative on file     Current Outpatient Prescriptions:  .  aspirin EC 81 MG tablet, Take 81 mg by mouth daily. , Disp: , Rfl:  .  atorvastatin (LIPITOR) 20 MG tablet, Take 1 tablet (20 mg total) by mouth daily., Disp: 90 tablet, Rfl: 1 .  glipiZIDE (GLUCOTROL XL) 5 MG 24 hr tablet, Take 5 mg by mouth 2 (two) times daily. , Disp: , Rfl: 0 .  metFORMIN (GLUCOPHAGE) 500 MG tablet, Take 500  mg by mouth 2 (two) times daily with a meal., Disp: , Rfl: 0 .  Multiple Vitamin (MULTIVITAMIN) capsule, Take 1 capsule by mouth daily., Disp: , Rfl:  .  omeprazole (PRILOSEC) 20 MG capsule, Take 1 capsule (20 mg total) by mouth daily., Disp: 90 capsule, Rfl: 1 .  SYNTHROID 150 MCG tablet, Take 150 mcg by mouth daily before breakfast. , Disp: , Rfl: 0 .  valsartan-hydrochlorothiazide (DIOVAN-HCT) 160-12.5 MG tablet, Take 1 tablet by mouth daily., Disp: 90 tablet, Rfl: 1  No Known Allergies   Review of Systems  Eyes: Negative for blurred vision.  Respiratory: Negative for cough and shortness of breath.   Cardiovascular: Negative for chest pain and palpitations.   Gastrointestinal: Negative for abdominal pain, dysphagia and heartburn.  Musculoskeletal: Negative for myalgias.  Neurological: Negative for headaches.    Objective  Vitals:   01/27/17 0921  BP: (!) 144/98  Pulse: 86  SpO2: 97%  Weight: 173 lb 12.8 oz (78.8 kg)  Height: 5\' 6"  (1.676 m)    Physical Exam  Constitutional: She is oriented to person, place, and time and well-developed, well-nourished, and in no distress.  Cardiovascular: Normal rate, regular rhythm, S1 normal, S2 normal and normal heart sounds.   No murmur heard. Pulmonary/Chest: Effort normal and breath sounds normal. She has no wheezes. She has no rhonchi.  Abdominal: Soft. Bowel sounds are normal. There is no tenderness.  Musculoskeletal:       Right ankle: She exhibits no swelling.       Left ankle: She exhibits no swelling.  Neurological: She is alert and oriented to person, place, and time.  Psychiatric: Mood, memory, affect and judgment normal.  Nursing note and vitals reviewed.    Assessment & Plan  1. Hyperlipidemia, unspecified hyperlipidemia type  - atorvastatin (LIPITOR) 20 MG tablet; Take 1 tablet (20 mg total) by mouth daily.  Dispense: 90 tablet; Refill: 1  2. Essential hypertension BP improved on manual repeat, continue on present treatment - valsartan-hydrochlorothiazide (DIOVAN-HCT) 160-12.5 MG tablet; Take 1 tablet by mouth daily.  Dispense: 90 tablet; Refill: 1  3. Reflux esophagitis Symptoms of acid reflux are stable on PPI - omeprazole (PRILOSEC) 20 MG capsule; Take 1 capsule (20 mg total) by mouth daily.  Dispense: 90 capsule; Refill: 1   Ridwan Bondy [facility] 01/27/2017 9:28 AM

## 2017-01-28 ENCOUNTER — Ambulatory Visit: Payer: Federal, State, Local not specified - PPO | Admitting: Family Medicine

## 2017-01-31 ENCOUNTER — Ambulatory Visit: Payer: Self-pay | Admitting: Physician Assistant

## 2017-02-05 ENCOUNTER — Telehealth: Payer: Self-pay

## 2017-02-05 NOTE — Telephone Encounter (Signed)
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 When her blood pressure was rechecked it was better at 138/82. I have informed her that her blood pressure could be elevated partly due to pain and taking high-dose NSAIDs, she should check with her physician who prescribed ibuprofen to see if they can give her something different for pain. Advised to check blood pressure regularly. Bring logs for review in one month.

## 2017-02-05 NOTE — Telephone Encounter (Signed)
Pt calls and states that her BP has been elevated the past few days. 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 Pt notes that she had a headache yesterday, denies blurred vision or dizziness and any pain currently. Please advise.

## 2017-02-12 ENCOUNTER — Telehealth: Payer: Self-pay

## 2017-02-12 NOTE — Telephone Encounter (Signed)
Agree with plan to be seen by provider in AM.

## 2017-02-12 NOTE — Telephone Encounter (Signed)
Spoke with pt and she states her BP is still elevated. Readings 02/03/2017: 174/97 10:30am, 02/05/2017 146/98 10:30am and 138/80 later that day, 02/07/2017 142/80 and 02/12/2017 this morning it was 160/90. Pt denies any lightness, headaches and chest pain. She however states she has right shoulder pain. Pt states the shoulder pain is from lifting heavy lien. I suggest that she be seen if her BP is still elevated and she's taking her BP medication  every morning. Pt schedule with emily 8:00am.

## 2017-02-13 ENCOUNTER — Encounter: Payer: Self-pay | Admitting: Family Medicine

## 2017-02-13 ENCOUNTER — Ambulatory Visit (INDEPENDENT_AMBULATORY_CARE_PROVIDER_SITE_OTHER): Payer: Federal, State, Local not specified - PPO | Admitting: Family Medicine

## 2017-02-13 VITALS — BP 140/90 | HR 100 | Temp 97.7°F | Resp 16 | Ht 66.0 in | Wt 176.1 lb

## 2017-02-13 DIAGNOSIS — E6609 Other obesity due to excess calories: Secondary | ICD-10-CM

## 2017-02-13 DIAGNOSIS — E119 Type 2 diabetes mellitus without complications: Secondary | ICD-10-CM

## 2017-02-13 DIAGNOSIS — I10 Essential (primary) hypertension: Secondary | ICD-10-CM

## 2017-02-13 DIAGNOSIS — R7889 Finding of other specified substances, not normally found in blood: Secondary | ICD-10-CM | POA: Diagnosis not present

## 2017-02-13 DIAGNOSIS — E559 Vitamin D deficiency, unspecified: Secondary | ICD-10-CM | POA: Diagnosis not present

## 2017-02-13 DIAGNOSIS — E785 Hyperlipidemia, unspecified: Secondary | ICD-10-CM | POA: Diagnosis not present

## 2017-02-13 DIAGNOSIS — E039 Hypothyroidism, unspecified: Secondary | ICD-10-CM

## 2017-02-13 DIAGNOSIS — Z6830 Body mass index (BMI) 30.0-30.9, adult: Secondary | ICD-10-CM | POA: Diagnosis not present

## 2017-02-13 MED ORDER — VALSARTAN-HYDROCHLOROTHIAZIDE 160-25 MG PO TABS: 1.0000 | ORAL_TABLET | Freq: Every day | ORAL | 0 refills | Status: DC

## 2017-02-13 MED ORDER — IBUPROFEN 600 MG PO TABS: 300.0000 mg | ORAL_TABLET | Freq: Three times a day (TID) | ORAL | 0 refills | Status: DC | PRN

## 2017-02-13 NOTE — Telephone Encounter (Signed)
Patient came in to see Raquel Sarna NP this morning regarding BP

## 2017-02-13 NOTE — Patient Instructions (Addendum)
Hypertension Hypertension is another name for high blood pressure. High blood pressure forces your heart to work harder to pump blood. This can cause problems over time. There are two numbers in a blood pressure reading. There is a top number (systolic) over a bottom number (diastolic). It is best to have a blood pressure below 120/80. Healthy choices can help lower your blood pressure. You may need medicine to help lower your blood pressure if:  Your blood pressure cannot be lowered with healthy choices.  Your blood pressure is higher than 130/80.  Follow these instructions at home: Eating and drinking  If directed, follow the DASH eating plan. This diet includes: ? Filling half of your plate at each meal with fruits and vegetables. ? Filling one quarter of your plate at each meal with whole grains. Whole grains include whole wheat pasta, brown rice, and whole grain bread. ? Eating or drinking low-fat dairy products, such as skim milk or low-fat yogurt. ? Filling one quarter of your plate at each meal with low-fat (lean) proteins. Low-fat proteins include fish, skinless chicken, eggs, beans, and tofu. ? Avoiding fatty meat, cured and processed meat, or chicken with skin. ? Avoiding premade or processed food.  Eat less than 1,500 mg of salt (sodium) a day.  Limit alcohol use to no more than 1 drink a day for nonpregnant women and 2 drinks a day for men. One drink equals 12 oz of beer, 5 oz of wine, or 1 oz of hard liquor. Lifestyle  Work with your doctor to stay at a healthy weight or to lose weight. Ask your doctor what the best weight is for you.  Get at least 30 minutes of exercise that causes your heart to beat faster (aerobic exercise) most days of the week. This may include walking, swimming, or biking.  Get at least 30 minutes of exercise that strengthens your muscles (resistance exercise) at least 3 days a week. This may include lifting weights or pilates.  Do not use any  products that contain nicotine or tobacco. This includes cigarettes and e-cigarettes. If you need help quitting, ask your doctor.  Check your blood pressure at home as told by your doctor.  Keep all follow-up visits as told by your doctor. This is important. Medicines  Take over-the-counter and prescription medicines only as told by your doctor. Follow directions carefully.  Do not skip doses of blood pressure medicine. The medicine does not work as well if you skip doses. Skipping doses also puts you at risk for problems.  Ask your doctor about side effects or reactions to medicines that you should watch for. Contact a doctor if:  You think you are having a reaction to the medicine you are taking.  You have headaches that keep coming back (recurring).  You feel dizzy.  You have swelling in your ankles.  You have trouble with your vision. Get help right away if:  You get a very bad headache.  You start to feel confused.  You feel weak or numb.  You feel faint.  You get very bad pain in your: ? Chest. ? Belly (abdomen).  You throw up (vomit) more than once.  You have trouble breathing. Summary  Hypertension is another name for high blood pressure.  Making healthy choices can help lower blood pressure. If your blood pressure cannot be controlled with healthy choices, you may need to take medicine. This information is not intended to replace advice given to you by your health care   provider. Make sure you discuss any questions you have with your health care provider. Document Released: 10/02/2007 Document Revised: 03/13/2016 Document Reviewed: 03/13/2016 Elsevier Interactive Patient Education  2018 [employer]  DASH Eating Plan DASH stands for "Dietary Approaches to Stop Hypertension." The DASH eating plan is a healthy eating plan that has been shown to reduce high blood pressure (hypertension). It may also reduce your risk for type 2 diabetes, heart disease, and  stroke. The DASH eating plan may also help with weight loss. What are tips for following this plan? General guidelines  Avoid eating more than 2,300 mg (milligrams) of salt (sodium) a day. If you have hypertension, you may need to reduce your sodium intake to 1,500 mg a day.  Limit alcohol intake to no more than 1 drink a day for nonpregnant women and 2 drinks a day for men. One drink equals 12 oz of beer, 5 oz of wine, or 1 oz of hard liquor.  Work with your health care provider to maintain a healthy body weight or to lose weight. Ask what an ideal weight is for you.  Get at least 30 minutes of exercise that causes your heart to beat faster (aerobic exercise) most days of the week. Activities may include walking, swimming, or biking.  Work with your health care provider or diet and nutrition specialist (dietitian) to adjust your eating plan to your individual calorie needs. Reading food labels  Check food labels for the amount of sodium per serving. Choose foods with less than 5 percent of the Daily Value of sodium. Generally, foods with less than 300 mg of sodium per serving fit into this eating plan.  To find whole grains, look for the word "whole" as the first word in the ingredient list. Shopping  Buy products labeled as "low-sodium" or "no salt added."  Buy fresh foods. Avoid canned foods and premade or frozen meals. Cooking  Avoid adding salt when cooking. Use salt-free seasonings or herbs instead of table salt or sea salt. Check with your health care provider or pharmacist before using salt substitutes.  Do not fry foods. Cook foods using healthy methods such as baking, boiling, grilling, and broiling instead.  Cook with heart-healthy oils, such as olive, canola, soybean, or sunflower oil. Meal planning   Eat a balanced diet that includes: ? 5 or more servings of fruits and vegetables each day. At each meal, try to fill half of your plate with fruits and vegetables. ? Up  to 6-8 servings of whole grains each day. ? Less than 6 oz of lean meat, poultry, or fish each day. A 3-oz serving of meat is about the same size as a deck of cards. One egg equals 1 oz. ? 2 servings of low-fat dairy each day. ? A serving of nuts, seeds, or beans 5 times each week. ? Heart-healthy fats. Healthy fats called Omega-3 fatty acids are found in foods such as flaxseeds and coldwater fish, like sardines, salmon, and mackerel.  Limit how much you eat of the following: ? Canned or prepackaged foods. ? Food that is high in trans fat, such as fried foods. ? Food that is high in saturated fat, such as fatty meat. ? Sweets, desserts, sugary drinks, and other foods with added sugar. ? Full-fat dairy products.  Do not salt foods before eating.  Try to eat at least 2 vegetarian meals each week.  Eat more home-cooked food and less restaurant, buffet, and fast food.  When eating at a restaurant,   Canned or prepackaged foods.  ? Food that is high in trans fat, such as fried foods.  ? Food that is high in saturated fat, such as fatty meat.  ? Sweets, desserts, sugary drinks, and other foods with added sugar.  ? Full-fat dairy products.   Do not salt foods before eating.   Try to eat at least 2 vegetarian meals each week.   Eat more home-cooked food and less restaurant, buffet, and fast food.   When eating at a restaurant, ask that your food be prepared with less salt or no salt, if possible.  What foods are recommended?  The items listed may not be a complete list. Talk with your dietitian about what dietary choices are best for you.  Grains  Whole-grain or whole-wheat bread. Whole-grain or whole-wheat pasta. Brown rice. Oatmeal. Quinoa. Bulgur. Whole-grain and low-sodium cereals. Pita bread. Low-fat, low-sodium crackers. Whole-wheat flour tortillas.  Vegetables  Fresh or frozen vegetables (raw, steamed, roasted, or grilled). Low-sodium or reduced-sodium tomato and vegetable juice. Low-sodium or reduced-sodium tomato sauce and tomato paste. Low-sodium or reduced-sodium canned vegetables.  Fruits  All fresh, dried, or frozen fruit. Canned fruit in natural juice (without added sugar).  Meat and other protein foods  Skinless chicken or turkey. Ground chicken or turkey. Pork with fat trimmed off. Fish and seafood. Egg whites. Dried beans, peas, or lentils. Unsalted nuts, nut butters, and seeds. Unsalted canned beans. Lean cuts of  beef with fat trimmed off. Low-sodium, lean deli meat.  Dairy  Low-fat (1%) or fat-free (skim) milk. Fat-free, low-fat, or reduced-fat cheeses. Nonfat, low-sodium ricotta or cottage cheese. Low-fat or nonfat yogurt. Low-fat, low-sodium cheese.  Fats and oils  Soft margarine without trans fats. Vegetable oil. Low-fat, reduced-fat, or light mayonnaise and salad dressings (reduced-sodium). Canola, safflower, olive, soybean, and sunflower oils. Avocado.  Seasoning and other foods  Herbs. Spices. Seasoning mixes without salt. Unsalted popcorn and pretzels. Fat-free sweets.  What foods are not recommended?  The items listed may not be a complete list. Talk with your dietitian about what dietary choices are best for you.  Grains  Baked goods made with fat, such as croissants, muffins, or some breads. Dry pasta or rice meal packs.  Vegetables  Creamed or fried vegetables. Vegetables in a cheese sauce. Regular canned vegetables (not low-sodium or reduced-sodium). Regular canned tomato sauce and paste (not low-sodium or reduced-sodium). Regular tomato and vegetable juice (not low-sodium or reduced-sodium). Pickles. Olives.  Fruits  Canned fruit in a light or heavy syrup. Fried fruit. Fruit in cream or butter sauce.  Meat and other protein foods  Fatty cuts of meat. Ribs. Fried meat. Bacon. Sausage. Bologna and other processed lunch meats. Salami. Fatback. Hotdogs. Bratwurst. Salted nuts and seeds. Canned beans with added salt. Canned or smoked fish. Whole eggs or egg yolks. Chicken or turkey with skin.  Dairy  Whole or 2% milk, cream, and half-and-half. Whole or full-fat cream cheese. Whole-fat or sweetened yogurt. Full-fat cheese. Nondairy creamers. Whipped toppings. Processed cheese and cheese spreads.  Fats and oils  Butter. Stick margarine. Lard. Shortening. Ghee. Bacon fat. Tropical oils, such as coconut, palm kernel, or palm oil.  Seasoning and other foods  Salted popcorn and pretzels. Onion salt, garlic salt, seasoned  salt, table salt, and sea salt. Worcestershire sauce. Tartar sauce. Barbecue sauce. Teriyaki sauce. Soy sauce, including reduced-sodium. Steak sauce. Canned and packaged gravies. Fish sauce. Oyster sauce. Cocktail sauce. Horseradish that you find on the shelf. Ketchup. Mustard. Meat flavorings and tenderizers.   fruits and vegetables, whole grains, lean proteins, low-fat dairy, and heart-healthy fats.  Work with your health care provider or diet and nutrition specialist (dietitian) to adjust your eating plan to your individual calorie needs. This information is not intended to replace advice given to you by your health care provider. Make sure you discuss any questions you have with your health care provider. Document Released: 04/04/2011 Document Revised: 04/08/2016 Document Reviewed: 04/08/2016 Elsevier Interactive Patient Education  2017 [employer].

## 2017-02-13 NOTE — Progress Notes (Signed)
Name: Valerie Manning   MRN: [id number]    DOB: [birth year]   Date:02/13/2017       Progress Note  Subjective  Chief Complaint  Chief Complaint  Patient presents with  . Hypertension    BP elevated for 2 weeks 161/98- 160/90    HPI  Patient presents with concern for elevated BP readings yesterday while at work - 161/98, and 160/90.  She has been checking her BP at work intermittently: 02/03/17 - 174/97 02/05/17 - 146/98, 138/80 02/07/17 - 142/80  - Avoids cooking with salt, does not add additional salt; she has been eating out more lately. - She takes Diovan-HCT 160-12.5mg   In the morning around 9am.  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  This is also where she is able to check her BP. - Stopped taking 600mg  Ibuprofen TID on 01/27/2017 for right shoulder pain, though her orthopedic provider told her she may take 300mg  TID PRN. - Does have hypothyroid that is well controlled - Sees Dr. Rosario Jacks who manages this and her diabetes - both of which have been well controlled with medication. She does have an appointment with him today, and she will call our clinic if her TSH is abnormal or if there are changes in her medication regimen.  She denies palpitations, skin/hair/nail changes, polyuria, polydipsia, or polyphagia. - Denies chest pain, shortness of breath, palpitations, vision changes, headache, extremity weakness, facial droop, confusion, speech difficulties.  Patient Active Problem List   Diagnosis Date Noted  . 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 Palpable mass of neck 02/01/2016  . Status post abdominal hysterectomy and left salpingo-oophorectomy 10/25/2015  . Increased BMI 10/25/2015  . Absolute anemia 03/13/2015  . Abnormal findings on diagnostic imaging of urinary organs 03/13/2015  . Reflux esophagitis 03/13/2015  . HLD (hyperlipidemia) 03/13/2015  . Acquired hypothyroidism 03/13/2015  . Anemia, iron deficiency 03/13/2015  . Fibroid 03/13/2015  .  Adiposity 03/13/2015  . Obesity 11/09/2014  . Hypertension 08/15/2014  . Diabetes ([place]) 08/15/2014    Social History  Substance Use Topics  . Smoking status: Never Smoker  . Smokeless tobacco: Never Used  . Alcohol use No    Current Outpatient Prescriptions:  .  aspirin EC 81 MG tablet, Take 81 mg by mouth daily. , Disp: , Rfl:  .  atorvastatin (LIPITOR) 20 MG tablet, Take 1 tablet (20 mg total) by mouth daily., Disp: 90 tablet, Rfl: 1 .  glipiZIDE (GLUCOTROL XL) 5 MG 24 hr tablet, Take 5 mg by mouth 2 (two) times daily. , Disp: , Rfl: 0 .  metFORMIN (GLUCOPHAGE) 500 MG tablet, Take 500 mg by mouth 2 (two) times daily with a meal., Disp: , Rfl: 0 .  Multiple Vitamin (MULTIVITAMIN) capsule, Take 1 capsule by mouth daily., Disp: , Rfl:  .  omeprazole (PRILOSEC) 20 MG capsule, Take 1 capsule (20 mg total) by mouth daily., Disp: 90 capsule, Rfl: 1 .  SYNTHROID 150 MCG tablet, Take 150 mcg by mouth daily before breakfast. , Disp: , Rfl: 0 .  valsartan-hydrochlorothiazide (DIOVAN-HCT) 160-12.5 MG tablet, Take 1 tablet by mouth daily., Disp: 90 tablet, Rfl: 1 .  IBU 600 MG tablet, take 1 tablet by mouth three times a day with food, Disp: , Rfl: 0  No Known Allergies  ROS Constitutional: Negative for fever or weight change.  Respiratory: Negative for cough and shortness of breath.   Cardiovascular: Negative for chest pain or palpitations.  Gastrointestinal:  Negative for abdominal pain, no bowel changes.  Musculoskeletal: Negative for gait problem or joint swelling.  Skin: Negative for rash.  Neurological: Negative for dizziness or headache.  No other specific complaints in a complete review of systems (except as listed in HPI above).  Objective  Vitals:   02/13/17 0812  BP: 140/90  Pulse: 100  Resp: 16  Temp: 97.7 F (36.5 C)  TempSrc: Oral  SpO2: 97%  Weight: 176 lb 1.6 oz (79.9 kg)  Height: 5\' 6"  (1.676 m)   Body mass index is 28.42 kg/m.  Nursing Note and Vital Signs  reviewed.  Physical Exam  Constitutional: Patient appears well-developed and well-nourished. Obese. No distress.  HEENT: head atraumatic, normocephalic, pupils equal and reactive to light, EOM's intact Cardiovascular: Normal rate, regular rhythm, S1/S2 present.  No murmur or rub heard. No BLE edema. Pulmonary/Chest: Effort normal and breath sounds clear. No respiratory distress or retractions. Psychiatric: Patient has a normal mood and affect. behavior is normal. Judgment and thought content normal.  Recent Results (from the past 2160 hour(s))  [facility] DIABETES EYE EXAM     Status: None   Collection Time: 12/03/16 12:00 AM  Result Value Ref Range   [facility] Diabetic Eye Exam No Retinopathy No Retinopathy    Comment: Dr. Steffanie Rainwater   Assessment & Plan  1. Essential hypertension - valsartan-hydrochlorothiazide (DIOVAN HCT) 160-25 MG tablet; Take 1 tablet by mouth daily.  Dispense: 30 tablet; Refill: 0 - DASH diet and lifestyle modifications discussed in detail. - Pt will check BP 2-3 times a week and return for re-evaluation in 2 weeks.  2. Class 1 obesity due to excess calories with serious comorbidity and body mass index (BMI) of 30.0 to 30.9 in adult Advised DASH diet, regular exercise, and decrease in weight will all aid in a reduction of BP  3. Acquired hypothyroidism Pt will have this checked today by her endocrinologist to ensure this is not contributing to her elevated BP.  4. Type 2 diabetes mellitus without complication, without long-term current use of insulin (HCC) Stable.  -Red flags and when to present for emergency care or RTC including fever >101.55F, chest pain, shortness of breath, signs and symptoms of stroke, new/worsening/un-resolving symptoms, reviewed with patient at time of visit. Follow up and care instructions discussed and provided in AVS.

## 2017-02-27 ENCOUNTER — Encounter: Payer: Self-pay | Admitting: Family Medicine

## 2017-02-27 ENCOUNTER — Ambulatory Visit (INDEPENDENT_AMBULATORY_CARE_PROVIDER_SITE_OTHER): Payer: Federal, State, Local not specified - PPO | Admitting: Family Medicine

## 2017-02-27 VITALS — BP 122/74 | HR 91 | Temp 98.2°F | Resp 16 | Ht 66.0 in | Wt 175.5 lb

## 2017-02-27 DIAGNOSIS — I10 Essential (primary) hypertension: Secondary | ICD-10-CM | POA: Diagnosis not present

## 2017-02-27 NOTE — Progress Notes (Signed)
Name: Valerie Manning   MRN: [id number]    DOB: 12-20-1965   Date:02/27/2017       Progress Note  Subjective  Chief Complaint  Chief Complaint  Patient presents with  . Follow-up    elevated blood pressure    HPI  Pt presnts for HTN follow up.  She has been feeling well with no lightheadedness, chest pain, shortness of breath, headaches, blurred vision, or any other complaints. Has been eating healthier and trying to avoid additional salt, drinking plenty of water. BP's have been running 120-130's/88-90 at home.  She is feeling very well.  Patient Active Problem List   Diagnosis Date Noted  . 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 Palpable mass of neck 02/01/2016  . Status post abdominal hysterectomy and left salpingo-oophorectomy 10/25/2015  . Increased BMI 10/25/2015  . Absolute anemia 03/13/2015  . Abnormal findings on diagnostic imaging of urinary organs 03/13/2015  . Reflux esophagitis 03/13/2015  . HLD (hyperlipidemia) 03/13/2015  . Acquired hypothyroidism 03/13/2015  . Anemia, iron deficiency 03/13/2015  . Fibroid 03/13/2015  . Adiposity 03/13/2015  . Obesity 11/09/2014  . Hypertension 08/15/2014  . Diabetes ([place]) 08/15/2014    Social History  Substance Use Topics  . Smoking status: Never Smoker  . Smokeless tobacco: Never Used  . Alcohol use No     Current Outpatient Prescriptions:  .  aspirin EC 81 MG tablet, Take 81 mg by mouth daily. , Disp: , Rfl:  .  atorvastatin (LIPITOR) 20 MG tablet, Take 1 tablet (20 mg total) by mouth daily., Disp: 90 tablet, Rfl: 1 .  glipiZIDE (GLUCOTROL XL) 5 MG 24 hr tablet, Take 5 mg by mouth 2 (two) times daily. , Disp: , Rfl: 0 .  ibuprofen (ADVIL,MOTRIN) 600 MG tablet, Take 0.5 tablets (300 mg total) by mouth every 8 (eight) hours as needed for moderate pain., Disp: 30 tablet, Rfl: 0 .  metFORMIN (GLUCOPHAGE) 500 MG tablet, Take 500 mg by mouth 2 (two) times daily with a meal., Disp: , Rfl: 0 .  Multiple Vitamin (MULTIVITAMIN) capsule,  Take 1 capsule by mouth daily., Disp: , Rfl:  .  omeprazole (PRILOSEC) 20 MG capsule, Take 1 capsule (20 mg total) by mouth daily., Disp: 90 capsule, Rfl: 1 .  SYNTHROID 150 MCG tablet, Take 150 mcg by mouth daily before breakfast. , Disp: , Rfl: 0 .  valsartan-hydrochlorothiazide (DIOVAN HCT) 160-25 MG tablet, Take 1 tablet by mouth daily., Disp: 30 tablet, Rfl: 0  No Known Allergies  ROS  Constitutional: Negative for fever or weight change.  Respiratory: Negative for cough and shortness of breath.   Cardiovascular: Negative for chest pain or palpitations.  Gastrointestinal: Negative for abdominal pain, no bowel changes.  Musculoskeletal: Negative for gait problem or joint swelling.  Skin: Negative for rash.  Neurological: Negative for dizziness or headache.  No other specific complaints in a complete review of systems (except as listed in HPI above).  Objective  Vitals:   02/27/17 1517  BP: 122/74  Pulse: 91  Resp: 16  Temp: 98.2 F (36.8 C)  TempSrc: Oral  SpO2: 99%  Weight: 175 lb 8 oz (79.6 kg)  Height: 5\' 6"  (1.676 m)   Body mass index is 28.33 kg/m.  Nursing Note and Vital Signs reviewed.  Physical Exam Constitutional: Patient appears well-developed and well-nourished. Obese No distress.  HEENT: head atraumatic, normocephalic Cardiovascular: Normal rate, regular rhythm, S1/S2 present.  No murmur or rub heard. No BLE edema. Pulmonary/Chest: Effort normal and breath  sounds clear. No respiratory distress or retractions. Psychiatric: Patient has a normal mood and affect. behavior is normal. Judgment and thought content normal.  Recent Results (from the past 2160 hour(s))  HM DIABETES EYE EXAM     Status: None   Collection Time: 12/03/16 12:00 AM  Result Value Ref Range   [facility] Diabetic Eye Exam No Retinopathy No Retinopathy    Comment: Dr. Steffanie Rainwater     Assessment & Plan  1. Essential hypertension DASH diet reinforced.  We will maintain medications as they are  currently.  Follow up in January with Dr. Manuella Ghazi as scheduled.  Continue to monitor BP weekly and keep log to bring to next appointment.  -Red flags and when to present for emergency care or RTC including fever >101.37F, chest pain, shortness of breath, new/worsening/un-resolving symptoms, reviewed with patient at time of visit. Follow up and care instructions discussed and provided in AVS.

## 2017-02-27 NOTE — Patient Instructions (Addendum)
DASH Eating Plan DASH stands for "Dietary Approaches to Stop Hypertension." The DASH eating plan is a healthy eating plan that has been shown to reduce high blood pressure (hypertension). It may also reduce your risk for type 2 diabetes, heart disease, and stroke. The DASH eating plan may also help with weight loss. What are tips for following this plan? General guidelines  Avoid eating more than 2,300 mg (milligrams) of salt (sodium) a day. If you have hypertension, you may need to reduce your sodium intake to 1,500 mg a day.  Limit alcohol intake to no more than 1 drink a day for nonpregnant women and 2 drinks a day for men. One drink equals 12 oz of beer, 5 oz of wine, or 1 oz of hard liquor.  Work with your health care provider to maintain a healthy body weight or to lose weight. Ask what an ideal weight is for you.  Get at least 30 minutes of exercise that causes your heart to beat faster (aerobic exercise) most days of the week. Activities may include walking, swimming, or biking.  Work with your health care provider or diet and nutrition specialist (dietitian) to adjust your eating plan to your individual calorie needs. Reading food labels  Check food labels for the amount of sodium per serving. Choose foods with less than 5 percent of the Daily Value of sodium. Generally, foods with less than 300 mg of sodium per serving fit into this eating plan.  To find whole grains, look for the word "whole" as the first word in the ingredient list. Shopping  Buy products labeled as "low-sodium" or "no salt added."  Buy fresh foods. Avoid canned foods and premade or frozen meals. Cooking  Avoid adding salt when cooking. Use salt-free seasonings or herbs instead of table salt or sea salt. Check with your health care provider or pharmacist before using salt substitutes.  Do not fry foods. Cook foods using healthy methods such as baking, boiling, grilling, and broiling instead.  Cook with  heart-healthy oils, such as olive, canola, soybean, or sunflower oil. Meal planning   Eat a balanced diet that includes: ? 5 or more servings of fruits and vegetables each day. At each meal, try to fill half of your plate with fruits and vegetables. ? Up to 6-8 servings of whole grains each day. ? Less than 6 oz of lean meat, poultry, or fish each day. A 3-oz serving of meat is about the same size as a deck of cards. One egg equals 1 oz. ? 2 servings of low-fat dairy each day. ? A serving of nuts, seeds, or beans 5 times each week. ? Heart-healthy fats. Healthy fats called Omega-3 fatty acids are found in foods such as flaxseeds and coldwater fish, like sardines, salmon, and mackerel.  Limit how much you eat of the following: ? Canned or prepackaged foods. ? Food that is high in trans fat, such as fried foods. ? Food that is high in saturated fat, such as fatty meat. ? Sweets, desserts, sugary drinks, and other foods with added sugar. ? Full-fat dairy products.  Do not salt foods before eating.  Try to eat at least 2 vegetarian meals each week.  Eat more home-cooked food and less restaurant, buffet, and fast food.  When eating at a restaurant, ask that your food be prepared with less salt or no salt, if possible. What foods are recommended? The items listed may not be a complete list. Talk with your dietitian about what   dietary choices are best for you. Grains Whole-grain or whole-wheat bread. Whole-grain or whole-wheat pasta. Brown rice. Oatmeal. Quinoa. Bulgur. Whole-grain and low-sodium cereals. Pita bread. Low-fat, low-sodium crackers. Whole-wheat flour tortillas. Vegetables Fresh or frozen vegetables (raw, steamed, roasted, or grilled). Low-sodium or reduced-sodium tomato and vegetable juice. Low-sodium or reduced-sodium tomato sauce and tomato paste. Low-sodium or reduced-sodium canned vegetables. Fruits All fresh, dried, or frozen fruit. Canned fruit in natural juice (without  added sugar). Meat and other protein foods Skinless chicken or turkey. Ground chicken or turkey. Pork with fat trimmed off. Fish and seafood. Egg whites. Dried beans, peas, or lentils. Unsalted nuts, nut butters, and seeds. Unsalted canned beans. Lean cuts of beef with fat trimmed off. Low-sodium, lean deli meat. Dairy Low-fat (1%) or fat-free (skim) milk. Fat-free, low-fat, or reduced-fat cheeses. Nonfat, low-sodium ricotta or cottage cheese. Low-fat or nonfat yogurt. Low-fat, low-sodium cheese. Fats and oils Soft margarine without trans fats. Vegetable oil. Low-fat, reduced-fat, or light mayonnaise and salad dressings (reduced-sodium). Canola, safflower, olive, soybean, and sunflower oils. Avocado. Seasoning and other foods Herbs. Spices. Seasoning mixes without salt. Unsalted popcorn and pretzels. Fat-free sweets. What foods are not recommended? The items listed may not be a complete list. Talk with your dietitian about what dietary choices are best for you. Grains Baked goods made with fat, such as croissants, muffins, or some breads. Dry pasta or rice meal packs. Vegetables Creamed or fried vegetables. Vegetables in a cheese sauce. Regular canned vegetables (not low-sodium or reduced-sodium). Regular canned tomato sauce and paste (not low-sodium or reduced-sodium). Regular tomato and vegetable juice (not low-sodium or reduced-sodium). Pickles. Olives. Fruits Canned fruit in a light or heavy syrup. Fried fruit. Fruit in cream or butter sauce. Meat and other protein foods Fatty cuts of meat. Ribs. Fried meat. Bacon. Sausage. Bologna and other processed lunch meats. Salami. Fatback. Hotdogs. Bratwurst. Salted nuts and seeds. Canned beans with added salt. Canned or smoked fish. Whole eggs or egg yolks. Chicken or turkey with skin. Dairy Whole or 2% milk, cream, and half-and-half. Whole or full-fat cream cheese. Whole-fat or sweetened yogurt. Full-fat cheese. Nondairy creamers. Whipped toppings.  Processed cheese and cheese spreads. Fats and oils Butter. Stick margarine. Lard. Shortening. Ghee. Bacon fat. Tropical oils, such as coconut, palm kernel, or palm oil. Seasoning and other foods Salted popcorn and pretzels. Onion salt, garlic salt, seasoned salt, table salt, and sea salt. Worcestershire sauce. Tartar sauce. Barbecue sauce. Teriyaki sauce. Soy sauce, including reduced-sodium. Steak sauce. Canned and packaged gravies. Fish sauce. Oyster sauce. Cocktail sauce. Horseradish that you find on the shelf. Ketchup. Mustard. Meat flavorings and tenderizers. Bouillon cubes. Hot sauce and Tabasco sauce. Premade or packaged marinades. Premade or packaged taco seasonings. Relishes. Regular salad dressings. Where to find more information:  National Heart, Lung, and Blood Institute: www.nhlbi.nih.gov  American Heart Association: www.heart.org Summary  The DASH eating plan is a healthy eating plan that has been shown to reduce high blood pressure (hypertension). It may also reduce your risk for type 2 diabetes, heart disease, and stroke.  With the DASH eating plan, you should limit salt (sodium) intake to 2,300 mg a day. If you have hypertension, you may need to reduce your sodium intake to 1,500 mg a day.  When on the DASH eating plan, aim to eat more fresh fruits and vegetables, whole grains, lean proteins, low-fat dairy, and heart-healthy fats.  Work with your health care provider or diet and nutrition specialist (dietitian) to adjust your eating plan to your individual   calorie needs. This information is not intended to replace advice given to you by your health care provider. Make sure you discuss any questions you have with your health care provider. Document Released: 04/04/2011 Document Revised: 04/08/2016 Document Reviewed: 04/08/2016 Elsevier Interactive Patient Education  2017 Elsevier Inc. Hypertension Hypertension is another name for high blood pressure. High blood pressure forces  your heart to work harder to pump blood. This can cause problems over time. There are two numbers in a blood pressure reading. There is a top number (systolic) over a bottom number (diastolic). It is best to have a blood pressure below 120/80. Healthy choices can help lower your blood pressure. You may need medicine to help lower your blood pressure if:  Your blood pressure cannot be lowered with healthy choices.  Your blood pressure is higher than 130/80.  Follow these instructions at home: Eating and drinking  If directed, follow the DASH eating plan. This diet includes: ? Filling half of your plate at each meal with fruits and vegetables. ? Filling one quarter of your plate at each meal with whole grains. Whole grains include whole wheat pasta, brown rice, and whole grain bread. ? Eating or drinking low-fat dairy products, such as skim milk or low-fat yogurt. ? Filling one quarter of your plate at each meal with low-fat (lean) proteins. Low-fat proteins include fish, skinless chicken, eggs, beans, and tofu. ? Avoiding fatty meat, cured and processed meat, or chicken with skin. ? Avoiding premade or processed food.  Eat less than 1,500 mg of salt (sodium) a day.  Limit alcohol use to no more than 1 drink a day for nonpregnant women and 2 drinks a day for men. One drink equals 12 oz of beer, 5 oz of wine, or 1 oz of hard liquor. Lifestyle  Work with your doctor to stay at a healthy weight or to lose weight. Ask your doctor what the best weight is for you.  Get at least 30 minutes of exercise that causes your heart to beat faster (aerobic exercise) most days of the week. This may include walking, swimming, or biking.  Get at least 30 minutes of exercise that strengthens your muscles (resistance exercise) at least 3 days a week. This may include lifting weights or pilates.  Do not use any products that contain nicotine or tobacco. This includes cigarettes and e-cigarettes. If you need  help quitting, ask your doctor.  Check your blood pressure at home as told by your doctor.  Keep all follow-up visits as told by your doctor. This is important. Medicines  Take over-the-counter and prescription medicines only as told by your doctor. Follow directions carefully.  Do not skip doses of blood pressure medicine. The medicine does not work as well if you skip doses. Skipping doses also puts you at risk for problems.  Ask your doctor about side effects or reactions to medicines that you should watch for. Contact a doctor if:  You think you are having a reaction to the medicine you are taking.  You have headaches that keep coming back (recurring).  You feel dizzy.  You have swelling in your ankles.  You have trouble with your vision. Get help right away if:  You get a very bad headache.  You start to feel confused.  You feel weak or numb.  You feel faint.  You get very bad pain in your: ? Chest. ? Belly (abdomen).  You throw up (vomit) more than once.  You have trouble breathing. Summary    Summary  Hypertension is another name for high blood pressure.  Making healthy choices can help lower blood pressure. If your blood pressure cannot be controlled with healthy choices, you may need to take medicine. This information is not intended to replace advice given to you by your health care provider. Make sure you discuss any questions you have with your health care provider. Document Released: 10/02/2007 Document Revised: 03/13/2016 Document Reviewed: 03/13/2016 Elsevier Interactive Patient Education  2018 Elsevier Inc.  

## 2017-03-11 ENCOUNTER — Other Ambulatory Visit: Payer: Self-pay | Admitting: Family Medicine

## 2017-03-11 DIAGNOSIS — I10 Essential (primary) hypertension: Secondary | ICD-10-CM

## 2017-03-31 ENCOUNTER — Ambulatory Visit: Payer: Self-pay | Admitting: Physician Assistant

## 2017-03-31 ENCOUNTER — Encounter: Payer: Self-pay | Admitting: Physician Assistant

## 2017-03-31 VITALS — BP 129/79 | HR 88 | Temp 98.5°F | Resp 16

## 2017-03-31 DIAGNOSIS — H6122 Impacted cerumen, left ear: Secondary | ICD-10-CM

## 2017-03-31 DIAGNOSIS — J0120 Acute ethmoidal sinusitis, unspecified: Secondary | ICD-10-CM

## 2017-03-31 HISTORY — DX: Impacted cerumen, left ear: H61.22

## 2017-03-31 MED ORDER — BENZONATATE 200 MG PO CAPS: 200.0000 mg | ORAL_CAPSULE | Freq: Two times a day (BID) | ORAL | 0 refills | Status: DC | PRN

## 2017-03-31 MED ORDER — AMOXICILLIN 875 MG PO TABS: 875.0000 mg | ORAL_TABLET | Freq: Two times a day (BID) | ORAL | 0 refills | Status: DC

## 2017-03-31 MED ORDER — CARBAMIDE PEROXIDE 6.5 % OT SOLN: 5.0000 [drp] | Freq: Two times a day (BID) | OTIC | 0 refills | Status: DC

## 2017-03-31 MED ORDER — FEXOFENADINE-PSEUDOEPHED ER 60-120 MG PO TB12: 1.0000 | ORAL_TABLET | Freq: Two times a day (BID) | ORAL | 0 refills | Status: DC

## 2017-03-31 NOTE — Progress Notes (Signed)
   Subjective: Sinus congestion     Patient ID: Valerie Manning, female    DOB: 10/13/1965, 51 y.o.   MRN: [id number]  HPI Patient complaining of 2 weeks of sinus congestion, postnasal drainage and cough. Patient also complains several secondary to a fullness in her left ear with decreased hearing. Patient denies fevers chills associated this complaint. Patient denies nausea vomiting or diarrhea. Paced the cough is nonproductive. No palliative measures for complaint.   Review of Systems    diabetes, hyperlipidemia and hypertension. Objective:   Physical Exam HEENT is remarkable for cerebral impaction of the left ear. Bilateral maxillary guarding. Edematous nasal turbinates. Postnasal drainage and erythematous pharynx. Neck is supple without adenopathy. Lungs are clear to auscultation heart is regular rate and rhythm.       Assessment & Plan: Sinusitis and left cerumen impaction.   Patient given discharge care instructions. Patient given prescription for Amoxil, Allegra-D, and Tessalon Perles. Patient advised to follow-up as needed.

## 2017-04-07 ENCOUNTER — Ambulatory Visit: Payer: Self-pay

## 2017-04-09 ENCOUNTER — Ambulatory Visit: Payer: Self-pay | Admitting: Physician Assistant

## 2017-04-09 ENCOUNTER — Encounter: Payer: Self-pay | Admitting: Physician Assistant

## 2017-04-09 VITALS — BP 124/88 | HR 87 | Temp 97.8°F

## 2017-04-09 DIAGNOSIS — H6122 Impacted cerumen, left ear: Secondary | ICD-10-CM

## 2017-04-09 NOTE — Progress Notes (Signed)
   Subjective:Cerumen impaction    Patient ID: Valerie Manning, female    DOB: May 17, 1965, 51 y.o.   MRN: [id number]  HPI Patient here for irrigation of the left ear secondary to impaction. Patient has been using earwax softener for 1 week.   Review of Systems Negative except for complaint    Objective:   Physical Exam Soft cerumen in left ear canal. Right canal is clear.       Assessment & Plan:cerumen impaction  Cerumen removed with irrigation. Patient advised to continue using it wax softener weekly.

## 2017-04-10 NOTE — Addendum Note (Signed)
Addended by: Vassie Loll D on: 04/10/2017 08:39 AM   Modules accepted: Orders

## 2017-04-11 ENCOUNTER — Ambulatory Visit: Payer: Self-pay | Admitting: Emergency Medicine

## 2017-04-11 VITALS — BP 130/80 | HR 89 | Temp 97.8°F

## 2017-04-11 DIAGNOSIS — H6122 Impacted cerumen, left ear: Secondary | ICD-10-CM

## 2017-04-11 NOTE — Progress Notes (Signed)
S: Here with continued left ear problems.  Patient was seen 2 days ago for cerumen impaction left ear.  She was given a prescription for Debrox to begin using and also ear was irrigated with out success.  Patient states she has continued to use the eardrops since her visit without any relief.  She denies any fever.  She continues to have decreased hearing in her left ear. O: Right EAC clear, left EAC with cerumen impaction.  There is some irritation along the canal.  TM is not visible. A: Left cerumen impaction. P: Will refer to [facility].

## 2017-04-16 ENCOUNTER — Ambulatory Visit: Payer: Self-pay

## 2017-04-30 ENCOUNTER — Ambulatory Visit: Payer: Federal, State, Local not specified - PPO | Admitting: Family Medicine

## 2017-05-01 DIAGNOSIS — H9202 Otalgia, left ear: Secondary | ICD-10-CM | POA: Diagnosis not present

## 2017-05-01 DIAGNOSIS — H6122 Impacted cerumen, left ear: Secondary | ICD-10-CM | POA: Diagnosis not present

## 2017-05-01 NOTE — Progress Notes (Signed)
This encounter was created in error - please disregard.

## 2017-05-14 ENCOUNTER — Telehealth: Payer: Self-pay | Admitting: Emergency Medicine

## 2017-05-14 ENCOUNTER — Other Ambulatory Visit: Payer: Self-pay | Admitting: Emergency Medicine

## 2017-05-14 MED ORDER — TELMISARTAN-HCTZ 40-12.5 MG PO TABS: 1.0000 | ORAL_TABLET | Freq: Every day | ORAL | 1 refills | Status: DC

## 2017-05-14 NOTE — Telephone Encounter (Signed)
Patient came into office with letter from pharmacy stating there was a recall on her blood pressure medicine Valsartan-Hydrochlorothiazide 160-25 mg. Patient need medication to be changed to new BP. Per Dr. Manuella Ghazi may change to Micardis HCT 40/12.5mg  qd. Script given to patient

## 2017-05-15 DIAGNOSIS — E032 Hypothyroidism due to medicaments and other exogenous substances: Secondary | ICD-10-CM | POA: Diagnosis not present

## 2017-05-15 DIAGNOSIS — E559 Vitamin D deficiency, unspecified: Secondary | ICD-10-CM | POA: Diagnosis not present

## 2017-05-15 DIAGNOSIS — I10 Essential (primary) hypertension: Secondary | ICD-10-CM | POA: Diagnosis not present

## 2017-05-15 DIAGNOSIS — R739 Hyperglycemia, unspecified: Secondary | ICD-10-CM | POA: Diagnosis not present

## 2017-05-19 LAB — HEPATIC FUNCTION PANEL
ALK PHOS: 97 (ref 25–125)
ALT: 28 (ref 7–35)
AST: 19 (ref 13–35)
Bilirubin, Total: 0.3

## 2017-05-19 LAB — LIPID PANEL
CHOLESTEROL: 136 (ref 0–200)
HDL: 71 — AB (ref 35–70)
LDL CALC: 40
TRIGLYCERIDES: 125 (ref 40–160)

## 2017-05-19 LAB — BASIC METABOLIC PANEL
BUN: 9 (ref 4–21)
Creatinine: 0.6 (ref 0.5–1.1)
Glucose: 143
Potassium: 4.2 (ref 3.4–5.3)
Sodium: 141 (ref 137–147)

## 2017-05-19 LAB — HEMOGLOBIN A1C: Hemoglobin A1C: 6.5

## 2017-05-19 LAB — TSH: TSH: 0.55 (ref 0.41–5.90)

## 2017-05-28 ENCOUNTER — Other Ambulatory Visit: Payer: Self-pay

## 2017-05-28 DIAGNOSIS — Z1231 Encounter for screening mammogram for malignant neoplasm of breast: Secondary | ICD-10-CM

## 2017-06-02 ENCOUNTER — Encounter: Payer: Self-pay | Admitting: *Deleted

## 2017-07-02 ENCOUNTER — Other Ambulatory Visit: Payer: Self-pay | Admitting: Family Medicine

## 2017-07-02 DIAGNOSIS — K2100 Gastro-esophageal reflux disease with esophagitis, without bleeding: Secondary | ICD-10-CM

## 2017-07-02 NOTE — Telephone Encounter (Signed)
Copied from [place]. Topic: Quick Communication - Rx Refill/Question >> Jul 02, 2017  3:34 PM Ritter Helsley, Doylene Canard E, NT wrote: Medication: metFORMIN (GLUCOPHAGE) 500 MG tablet and omeprazole (PRILOSEC) 20 MG capsule   Has the patient contacted their pharmacy? Yes    (Agent: If no, request that the patient contact the pharmacy for the refill.)   Preferred Pharmacy (with phone number or street name): [id number] - [name], [place] - [place] [phone] (Phone) [phone] (Fax)     Agent: Please be advised that RX refills may take up to 3 business days. We ask that you follow-up with your pharmacy.

## 2017-07-04 MED ORDER — METFORMIN HCL 500 MG PO TABS: 500.0000 mg | ORAL_TABLET | Freq: Two times a day (BID) | ORAL | 0 refills | Status: DC

## 2017-07-04 MED ORDER — OMEPRAZOLE 20 MG PO CPDR: 20.0000 mg | DELAYED_RELEASE_CAPSULE | Freq: Every day | ORAL | 0 refills | Status: DC

## 2017-07-04 NOTE — Telephone Encounter (Signed)
Refill request for diabetic medication:   Metformin 500 mg  Omeprazole 20 mg  Last office visit pertaining to diabetes: 04/30/2017   Lab Results  Component Value Date   HGBA1C 6.3 10/21/2016    Follow-up on file. None indicated

## 2017-07-11 ENCOUNTER — Ambulatory Visit: Payer: Federal, State, Local not specified - PPO | Admitting: Family Medicine

## 2017-07-11 ENCOUNTER — Encounter: Payer: Self-pay | Admitting: Family Medicine

## 2017-07-11 VITALS — BP 150/100 | HR 71 | Resp 14 | Ht 66.0 in | Wt 175.6 lb

## 2017-07-11 DIAGNOSIS — Z862 Personal history of diseases of the blood and blood-forming organs and certain disorders involving the immune mechanism: Secondary | ICD-10-CM | POA: Diagnosis not present

## 2017-07-11 DIAGNOSIS — E785 Hyperlipidemia, unspecified: Secondary | ICD-10-CM

## 2017-07-11 DIAGNOSIS — I10 Essential (primary) hypertension: Secondary | ICD-10-CM

## 2017-07-11 DIAGNOSIS — E1169 Type 2 diabetes mellitus with other specified complication: Secondary | ICD-10-CM | POA: Diagnosis not present

## 2017-07-11 DIAGNOSIS — E039 Hypothyroidism, unspecified: Secondary | ICD-10-CM

## 2017-07-11 LAB — POCT UA - MICROALBUMIN: Microalbumin Ur, POC: 20 mg/L

## 2017-07-11 MED ORDER — ATORVASTATIN CALCIUM 20 MG PO TABS: 20.0000 mg | ORAL_TABLET | Freq: Every day | ORAL | 1 refills | Status: DC

## 2017-07-11 MED ORDER — TELMISARTAN-HCTZ 80-25 MG PO TABS: 1.0000 | ORAL_TABLET | Freq: Every day | ORAL | 1 refills | Status: DC

## 2017-07-11 NOTE — Progress Notes (Signed)
Name: Valerie Manning   MRN: [id number]    DOB: Apr 04, 1966   Date:07/11/2017       Progress Note  Subjective  Chief Complaint  Chief Complaint  Patient presents with  . Diabetes  . Hypertension  . Anemia    HPI  HTN: bp is elevated, she is taking micardis hctz, she states she has been compliant with her medication but not with a DASH diet. No chest pain, palpitation, but has noticed some headaches lately. Mild   History of iron deficiency anemia: not on supplementation but sates improved after hysterectomy years ago.  Last labs showed low MCV, we will recheck labs  Hypothyroidism: history of goiter treated with iodine, she has been followed by Dr. Rosario Jacks, taking Synthroid 150 mcg daily. No dry skin, change in bowel movements or weight.   DMII: she states Dr. Rosario Jacks is managing her DM, she states immunizations given by [facility]. No polyphagia, polyuria or polydipsia.   Hyperlipidemia: taking atorvastatin, denies side effects.    Patient Active Problem List   Diagnosis Date Noted  . 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 Status post abdominal hysterectomy and left salpingo-oophorectomy 10/25/2015  . Increased BMI 10/25/2015  . HLD (hyperlipidemia) 03/13/2015  . Acquired hypothyroidism 03/13/2015  . History of iron deficiency anemia 03/13/2015  . Obesity 11/09/2014  . Hypertension 08/15/2014  . Diabetes mellitus type 2, uncomplicated ([place]) 41/96/2229  . GERD (gastroesophageal reflux disease) 05/31/2014    Past Surgical History:  Procedure Laterality Date  . ABDOMINAL HYSTERECTOMY    . 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 BREAST BIOPSY Right 09/13/2016   Procedure: BREAST BIOPSY WITH NEEDLE LOCALIZATION;  Surgeon: [name], MD;  Location: [facility] ORS;  Service: General;  Laterality: Right;  . BREAST EXCISIONAL BIOPSY Right 06/16/2016   lumpectomy complext sclerosing lesion    Family History  Problem Relation Age  of Onset  . Diabetes Mother   . Diabetes Sister   . Heart disease Sister   . Diabetes Sister   . Diabetes Sister   . Thyroid disease Sister   . Diabetes Sister   . Thyroid disease Sister   . Thyroid disease Sister   . Heart disease Father   . Cancer Neg Hx   . Breast cancer Neg Hx     Social History   Socioeconomic History  . Marital status: Married    Spouse name: Not on file  . Number of children: Not on file  . Years of education: Not on file  . Highest education level: Not on file  Social Needs  . Financial resource strain: Not on file  . Food insecurity - worry: Not on file  . Food insecurity - inability: Not on file  . Transportation needs - medical: Not on file  . Transportation needs - non-medical: Not on file  Occupational History  . Not on file  Tobacco Use  . Smoking status: Never Smoker  . Smokeless tobacco: Never Used  Substance and Sexual Activity  . Alcohol use: No    Alcohol/week: 0.0 oz  . Drug use: No  . Sexual activity: Yes    Birth control/protection: Surgical  Other Topics Concern  . Not on file  Social History Narrative  . Not on file     Current Outpatient Medications:  .  aspirin EC 81 MG tablet, Take 81 mg by mouth daily. , Disp: , Rfl:  .  atorvastatin (LIPITOR) 20  MG tablet, Take 1 tablet (20 mg total) by mouth daily., Disp: 90 tablet, Rfl: 1 .  glipiZIDE (GLUCOTROL XL) 5 MG 24 hr tablet, Take 5 mg by mouth 2 (two) times daily. , Disp: , Rfl: 0 .  metFORMIN (GLUCOPHAGE) 500 MG tablet, Take 1 tablet (500 mg total) by mouth 2 (two) times daily with a meal., Disp: 60 tablet, Rfl: 0 .  Multiple Vitamin (MULTIVITAMIN) capsule, Take 1 capsule by mouth daily., Disp: , Rfl:  .  omeprazole (PRILOSEC) 20 MG capsule, Take 1 capsule (20 mg total) by mouth daily., Disp: 30 capsule, Rfl: 0 .  SYNTHROID 150 MCG tablet, Take 150 mcg by mouth daily before breakfast. , Disp: , Rfl: 0 .  fexofenadine-pseudoephedrine (ALLEGRA-D) 60-120 MG 12 hr tablet,  Take 1 tablet by mouth 2 (two) times daily. (Patient not taking: Reported on 07/11/2017), Disp: 20 tablet, Rfl: 0 .  telmisartan-hydrochlorothiazide (MICARDIS HCT) 80-25 MG tablet, Take 1 tablet by mouth daily., Disp: 90 tablet, Rfl: 1  No Known Allergies   ROS  Constitutional: Negative for fever or weight change.  Respiratory: Negative for cough and shortness of breath.   Cardiovascular: Negative for chest pain or palpitations.  Gastrointestinal: Negative for abdominal pain, no bowel changes.  Musculoskeletal: Negative for gait problem or joint swelling.  Skin: Negative for rash.  Neurological: Negative for dizziness or headache.  No other specific complaints in a complete review of systems (except as listed in HPI above).  Objective  Vitals:   07/11/17 1519  BP: (!) 150/100  Pulse: 71  Resp: 14  SpO2: 96%  Weight: 175 lb 9.6 oz (79.7 kg)  Height: 5\' 6"  (1.676 m)    Body mass index is 28.34 kg/m.  Physical Exam  Constitutional: Patient appears well-developed and well-nourished. Overweight.  No distress.  HEENT: head atraumatic, normocephalic, pupils equal and reactive to light, neck supple, throat within normal limits Cardiovascular: Normal rate, regular rhythm and normal heart sounds.  No murmur heard. No BLE edema. Pulmonary/Chest: Effort normal and breath sounds normal. No respiratory distress. Abdominal: Soft.  There is no tenderness. Psychiatric: Patient has a normal mood and affect. behavior is normal. Judgment and thought content normal.  Diabetic Foot Exam: Diabetic Foot Exam - Simple   Simple Foot Form Diabetic Foot exam was performed with the following findings:  Yes 07/11/2017  4:16 PM  Visual Inspection No deformities, no ulcerations, no other skin breakdown bilaterally:  Yes Sensation Testing Intact to touch and monofilament testing bilaterally:  Yes Pulse Check Posterior Tibialis and Dorsalis pulse intact bilaterally:  Yes Comments      PHQ2/9: Depression screen [facility] 2/9 01/27/2017 10/21/2016 09/11/2016 05/06/2016 10/19/2015  Decreased Interest 0 0 0 0 0  Down, Depressed, Hopeless 0 0 0 0 0  PHQ - 2 Score 0 0 0 0 0     Fall Risk: Fall Risk  07/11/2017 01/27/2017 10/21/2016 09/11/2016 05/06/2016  Falls in the past year? No Yes No No No  Number falls in past yr: - 1 - - -  Injury with Fall? - Yes - - -     Functional Status Survey: Is the patient deaf or have difficulty hearing?: No Does the patient have difficulty seeing, even when wearing glasses/contacts?: No Does the patient have difficulty concentrating, remembering, or making decisions?: No Does the patient have difficulty walking or climbing stairs?: No Does the patient have difficulty dressing or bathing?: No Does the patient have difficulty doing errands alone such as visiting a doctor's office  or shopping?: No   Assessment & Plan  1. Hypertension, benign  - COMPLETE METABOLIC PANEL WITH GFR - telmisartan-hydrochlorothiazide (MICARDIS HCT) 80-25 MG tablet; Take 1 tablet by mouth daily.  Dispense: 90 tablet; Refill: 1 - POCT UA - Microalbumin  2. Hyperlipidemia, unspecified hyperlipidemia type  - atorvastatin (LIPITOR) 20 MG tablet; Take 1 tablet (20 mg total) by mouth daily.  Dispense: 90 tablet; Refill: 1 - POCT UA - Microalbumin  3. History of iron deficiency anemia  - CBC with Differential/Platelet - Iron, TIBC and Ferritin Panel; Future  4. Dyslipidemia associated with type 2 diabetes mellitus ([place])   5. Acquired hypothyroidism  Seeing Dr. Rosario Jacks

## 2017-07-13 ENCOUNTER — Other Ambulatory Visit
Admission: RE | Admit: 2017-07-13 | Discharge: 2017-07-13 | Disposition: A | Discharge status: Home / Self Care | Payer: Federal, State, Local not specified - PPO | Source: Ambulatory Visit | Attending: Family Medicine | Admitting: Family Medicine

## 2017-07-13 DIAGNOSIS — E785 Hyperlipidemia, unspecified: Secondary | ICD-10-CM | POA: Diagnosis not present

## 2017-07-13 DIAGNOSIS — Z862 Personal history of diseases of the blood and blood-forming organs and certain disorders involving the immune mechanism: Secondary | ICD-10-CM | POA: Diagnosis not present

## 2017-07-13 DIAGNOSIS — I10 Essential (primary) hypertension: Secondary | ICD-10-CM | POA: Diagnosis not present

## 2017-07-13 LAB — COMPREHENSIVE METABOLIC PANEL
ALT: 18 U/L (ref 14–54)
ANION GAP: 10 (ref 5–15)
AST: 20 U/L (ref 15–41)
Albumin: 4.3 g/dL (ref 3.5–5.0)
Alkaline Phosphatase: 75 U/L (ref 38–126)
BUN: 14 mg/dL (ref 6–20)
CHLORIDE: 100 mmol/L — AB (ref 101–111)
CO2: 31 mmol/L (ref 22–32)
Calcium: 9.8 mg/dL (ref 8.9–10.3)
Creatinine, Ser: 0.7 mg/dL (ref 0.44–1.00)
Glucose, Bld: 182 mg/dL — ABNORMAL HIGH (ref 65–99)
POTASSIUM: 3.8 mmol/L (ref 3.5–5.1)
SODIUM: 141 mmol/L (ref 135–145)
Total Bilirubin: 0.7 mg/dL (ref 0.3–1.2)
Total Protein: 7.9 g/dL (ref 6.5–8.1)

## 2017-07-13 LAB — CBC WITH DIFFERENTIAL/PLATELET
Basophils Absolute: 0.1 10*3/uL (ref 0–0.1)
Basophils Relative: 3 %
EOS ABS: 0.1 10*3/uL (ref 0–0.7)
EOS PCT: 3 %
HCT: 43.7 % (ref 35.0–47.0)
Hemoglobin: 13.9 g/dL (ref 12.0–16.0)
LYMPHS ABS: 1.3 10*3/uL (ref 1.0–3.6)
Lymphocytes Relative: 27 %
MCH: 25.6 pg — AB (ref 26.0–34.0)
MCHC: 31.9 g/dL — AB (ref 32.0–36.0)
MCV: 80.3 fL (ref 80.0–100.0)
MONO ABS: 0.2 10*3/uL (ref 0.2–0.9)
Monocytes Relative: 5 %
Neutro Abs: 3.2 10*3/uL (ref 1.4–6.5)
Neutrophils Relative %: 64 %
PLATELETS: 220 10*3/uL (ref 150–440)
RBC: 5.44 MIL/uL — AB (ref 3.80–5.20)
RDW: 15.3 % — AB (ref 11.5–14.5)
WBC: 5 10*3/uL (ref 3.6–11.0)

## 2017-07-13 LAB — IRON AND TIBC
IRON: 68 ug/dL (ref 28–170)
SATURATION RATIOS: 21 % (ref 10.4–31.8)
TIBC: 321 ug/dL (ref 250–450)
UIBC: 253 ug/dL

## 2017-07-13 LAB — FERRITIN: Ferritin: 43 ng/mL (ref 11–307)

## 2017-07-15 ENCOUNTER — Encounter: Payer: Self-pay | Admitting: Family Medicine

## 2017-07-18 ENCOUNTER — Ambulatory Visit (INDEPENDENT_AMBULATORY_CARE_PROVIDER_SITE_OTHER): Payer: Federal, State, Local not specified - PPO

## 2017-07-18 VITALS — BP 144/72 | HR 97 | Ht 66.0 in | Wt 175.0 lb

## 2017-07-18 DIAGNOSIS — I10 Essential (primary) hypertension: Secondary | ICD-10-CM

## 2017-07-18 NOTE — Progress Notes (Signed)
BP Check increased Micardis last visit to 80-25 mg once daily. Patient denies any symptoms. Dr. Ancil Boozer notified and states BP has improved with increase of dosage since it has only been 1 week. Last visit BP was 150/100. Dr. Ancil Boozer states for patient to come back in 2 weeks and make patient sit for 5 minutes before taking BP. Also continue on current dosage.

## 2017-07-21 ENCOUNTER — Encounter: Payer: Self-pay | Admitting: Family Medicine

## 2017-07-22 ENCOUNTER — Encounter: Payer: Self-pay | Admitting: Family Medicine

## 2017-07-23 DIAGNOSIS — K080 Exfoliation of teeth due to systemic causes: Secondary | ICD-10-CM | POA: Diagnosis not present

## 2017-07-29 ENCOUNTER — Other Ambulatory Visit: Payer: Self-pay | Admitting: General Surgery

## 2017-07-29 ENCOUNTER — Ambulatory Visit
Admission: RE | Admit: 2017-07-29 | Discharge: 2017-07-29 | Disposition: A | Discharge status: Home / Self Care | Payer: Federal, State, Local not specified - PPO | Source: Ambulatory Visit | Attending: General Surgery | Admitting: General Surgery

## 2017-07-29 DIAGNOSIS — Z1231 Encounter for screening mammogram for malignant neoplasm of breast: Secondary | ICD-10-CM | POA: Diagnosis not present

## 2017-08-01 ENCOUNTER — Ambulatory Visit (INDEPENDENT_AMBULATORY_CARE_PROVIDER_SITE_OTHER): Payer: Federal, State, Local not specified - PPO

## 2017-08-01 VITALS — BP 130/90 | HR 86 | Ht 66.0 in | Wt 175.7 lb

## 2017-08-01 DIAGNOSIS — I10 Essential (primary) hypertension: Secondary | ICD-10-CM

## 2017-08-01 NOTE — Progress Notes (Addendum)
Patient is here for a blood pressure check. Patient denies chest pain, palpitations, shortness of breath or visual disturbances. She does complain of a slight headache that she has off and on. She was not aware that her medication had been changed, she thought the dose was increased. She did get the letter about the Diovan having a recall, but she contacted her pharmacy and her lot number was not part of the recall.  At previous visit blood pressure was 150/100 with a heart rate of 71 . Today during nurse visit first check blood pressure was 130/90 with a heart rate of 86. She does take blood pressure medications as prescribed by PCP.

## 2017-08-01 NOTE — Progress Notes (Signed)
Continue current dose of medication and return for next follow up, may stop by sooner if she can for bp check

## 2017-08-07 ENCOUNTER — Ambulatory Visit (INDEPENDENT_AMBULATORY_CARE_PROVIDER_SITE_OTHER): Payer: Federal, State, Local not specified - PPO | Admitting: General Surgery

## 2017-08-07 ENCOUNTER — Encounter: Payer: Self-pay | Admitting: General Surgery

## 2017-08-07 VITALS — BP 144/70 | HR 74 | Resp 14 | Ht 67.0 in | Wt 174.0 lb

## 2017-08-07 DIAGNOSIS — Z1231 Encounter for screening mammogram for malignant neoplasm of breast: Secondary | ICD-10-CM

## 2017-08-07 NOTE — Patient Instructions (Addendum)
Patient will be asked to return her PCP ein one year with a bilateral screening mammogram.The patient is aware to call back for any questions or concerns.

## 2017-08-07 NOTE — Progress Notes (Signed)
Patient ID: Valerie Manning, female   DOB: 11/06/1965, 52 y.o.   MRN: [id number]  Chief Complaint  Patient presents with  . Follow-up    HPI Valerie Manning is a 52 y.o. female who presents for a breast evaluation. The most recent mammogram was done on 07/29/2017.   Patient does perform regular self breast checks and gets regular mammograms done.    HPI  Past Medical History:  Diagnosis Date  . Cramp in muscle    H/O OF MUSCLE CRAMP IN CHEST AND STOMACH OCCAS WHEN BENDS AT WAIST. WHEN STANDS AND BENDS BACK HEARS POP. CRAMP GOES AWAY. HAS NOT HAPPENES IN MANY MONTHS  . Diabetes mellitus without complication ([place])   . Hyperlipidemia   . Hypertension   . Left ear impacted cerumen 03/31/2017    Past Surgical History:  Procedure Laterality Date  . ABDOMINAL HYSTERECTOMY    . 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 BREAST BIOPSY Right 09/13/2016   Procedure: BREAST BIOPSY WITH NEEDLE LOCALIZATION;  Surgeon: [name], MD;  Location: [facility] ORS;  Service: General;  Laterality: Right;  . BREAST EXCISIONAL BIOPSY Right 06/16/2016   lumpectomy complext sclerosing lesion, benign    Family History  Problem Relation Age of Onset  . Diabetes Mother   . Diabetes Sister   . Heart disease Sister   . Diabetes Sister   . Diabetes Sister   . Thyroid disease Sister   . Diabetes Sister   . Thyroid disease Sister   . Thyroid disease Sister   . Heart disease Father   . Cancer Neg Hx   . Breast cancer Neg Hx     Social History Social History   Tobacco Use  . Smoking status: Never Smoker  . Smokeless tobacco: Never Used  Substance Use Topics  . Alcohol use: No    Alcohol/week: 0.0 oz  . Drug use: No    No Known Allergies  Current Outpatient Medications  Medication Sig Dispense Refill  . aspirin EC 81 MG tablet Take 81 mg by mouth daily.     Marland Kitchen atorvastatin (LIPITOR) 20 MG tablet Take 1 tablet (20 mg total) by mouth  daily. 90 tablet 1  . glipiZIDE (GLUCOTROL XL) 5 MG 24 hr tablet Take 5 mg by mouth 2 (two) times daily.   0  . metFORMIN (GLUCOPHAGE) 500 MG tablet Take 1 tablet (500 mg total) by mouth 2 (two) times daily with a meal. 60 tablet 0  . Multiple Vitamin (MULTIVITAMIN) capsule Take 1 capsule by mouth daily.    Marland Kitchen omeprazole (PRILOSEC) 20 MG capsule Take 1 capsule (20 mg total) by mouth daily. 30 capsule 0  . SYNTHROID 150 MCG tablet Take 150 mcg by mouth daily before breakfast.   0  . telmisartan-hydrochlorothiazide (MICARDIS HCT) 80-25 MG tablet Take 1 tablet by mouth daily. 90 tablet 1   No current facility-administered medications for this visit.     Review of Systems Review of Systems  Constitutional: Negative.   Respiratory: Negative.   Cardiovascular: Negative.     Blood pressure (!) 144/70, pulse 74, resp. rate 14, height 5\' 7"  (1.702 m), weight 174 lb (78.9 kg).  Physical Exam Physical Exam  Constitutional: She is oriented to person, place, and time. She appears well-developed and well-nourished.  Eyes: Conjunctivae are normal. No scleral icterus.  Neck: Neck supple.  Cardiovascular: Normal rate, regular rhythm and normal heart sounds.  Pulmonary/Chest: Effort  normal and breath sounds normal. Right breast exhibits no inverted nipple, no mass, no nipple discharge, no skin change and no tenderness. Left breast exhibits no inverted nipple, no mass, no nipple discharge, no skin change and no tenderness.    Right breast incision is clean and well healed.   Lymphadenopathy:    She has no cervical adenopathy.    She has no axillary adenopathy.  Neurological: She is alert and oriented to person, place, and time.  Skin: Skin is warm and dry.    Data Reviewed Screening mammograms dated July 29, 2017 reviewed.  Postsurgical changes.  BI-RADS-1.  Sep 13, 2016 surgical biopsy of the right breast: DIAGNOSIS:  A. RIGHT BREAST; NEEDLE LOCALIZED EXCISION WITH MAMMOGRAPHY GUIDANCE:  -  COMPLEX SCLEROSING LESION, 1.3 CM.  - NEGATIVE FOR ATYPIA AND MALIGNANCY.   Assessment    Benign breast exam status post excision of complex sclerosing lesion in May 2018.    Plan   Patient will be asked to return her PCP ein one year with a bilateral screening mammogram.The patient is aware to call back for any questions or concerns.  HPI, Physical Exam, Assessment and Plan have been scribed under the direction and in the presence of Hervey Ard, MD.  Gaspar Cola, CMA  I have completed the exam and reviewed the above documentation for accuracy and completeness.  I agree with the above.  Haematologist has been used and any errors in dictation or transcription are unintentional.  Hervey Ard, M.D., F.A.C.S.  Valerie Manning 08/07/2017, 8:26 PM

## 2017-08-25 ENCOUNTER — Other Ambulatory Visit: Payer: Self-pay | Admitting: Family Medicine

## 2017-08-25 DIAGNOSIS — K2100 Gastro-esophageal reflux disease with esophagitis, without bleeding: Secondary | ICD-10-CM

## 2017-08-25 NOTE — Telephone Encounter (Signed)
Refill request for general medication. Omeprazole to [facility].   Last office visit: 07/11/2017   Follow up on 10/14/2017

## 2017-09-19 ENCOUNTER — Other Ambulatory Visit: Payer: Self-pay | Admitting: Family Medicine

## 2017-09-19 DIAGNOSIS — K2100 Gastro-esophageal reflux disease with esophagitis, without bleeding: Secondary | ICD-10-CM

## 2017-10-13 ENCOUNTER — Other Ambulatory Visit: Payer: Self-pay

## 2017-10-14 ENCOUNTER — Ambulatory Visit: Payer: Federal, State, Local not specified - PPO | Admitting: Family Medicine

## 2017-10-14 ENCOUNTER — Other Ambulatory Visit
Admission: RE | Admit: 2017-10-14 | Discharge: 2017-10-14 | Disposition: A | Discharge status: Home / Self Care | Payer: Federal, State, Local not specified - PPO | Source: Ambulatory Visit | Attending: Family Medicine | Admitting: Family Medicine

## 2017-10-14 ENCOUNTER — Encounter: Payer: Self-pay | Admitting: Family Medicine

## 2017-10-14 VITALS — BP 146/90 | HR 74 | Resp 16 | Ht 67.0 in | Wt 175.6 lb

## 2017-10-14 DIAGNOSIS — E039 Hypothyroidism, unspecified: Secondary | ICD-10-CM | POA: Diagnosis not present

## 2017-10-14 DIAGNOSIS — E785 Hyperlipidemia, unspecified: Secondary | ICD-10-CM | POA: Diagnosis not present

## 2017-10-14 DIAGNOSIS — K2100 Gastro-esophageal reflux disease with esophagitis, without bleeding: Secondary | ICD-10-CM

## 2017-10-14 DIAGNOSIS — E1169 Type 2 diabetes mellitus with other specified complication: Secondary | ICD-10-CM | POA: Diagnosis not present

## 2017-10-14 DIAGNOSIS — I10 Essential (primary) hypertension: Secondary | ICD-10-CM

## 2017-10-14 LAB — POCT GLYCOSYLATED HEMOGLOBIN (HGB A1C): HEMOGLOBIN A1C: 6 % — AB (ref 4.0–5.6)

## 2017-10-14 LAB — TSH: TSH: 0.357 u[IU]/mL (ref 0.350–4.500)

## 2017-10-14 MED ORDER — GLIPIZIDE ER 5 MG PO TB24: 5.0000 mg | ORAL_TABLET | Freq: Every day | ORAL | 0 refills | Status: DC

## 2017-10-14 MED ORDER — RANITIDINE HCL 150 MG PO TABS: 150.0000 mg | ORAL_TABLET | Freq: Two times a day (BID) | ORAL | 0 refills | Status: DC | PRN

## 2017-10-14 MED ORDER — METFORMIN HCL ER 750 MG PO TB24: 1500.0000 mg | ORAL_TABLET | Freq: Every day | ORAL | 0 refills | Status: DC

## 2017-10-14 NOTE — Patient Instructions (Signed)
DASH Eating Plan DASH stands for "Dietary Approaches to Stop Hypertension." The DASH eating plan is a healthy eating plan that has been shown to reduce high blood pressure (hypertension). It may also reduce your risk for type 2 diabetes, heart disease, and stroke. The DASH eating plan may also help with weight loss. What are tips for following this plan? General guidelines  Avoid eating more than 2,300 mg (milligrams) of salt (sodium) a day. If you have hypertension, you may need to reduce your sodium intake to 1,500 mg a day.  Limit alcohol intake to no more than 1 drink a day for nonpregnant women and 2 drinks a day for men. One drink equals 12 oz of beer, 5 oz of wine, or 1 oz of hard liquor.  Work with your health care provider to maintain a healthy body weight or to lose weight. Ask what an ideal weight is for you.  Get at least 30 minutes of exercise that causes your heart to beat faster (aerobic exercise) most days of the week. Activities may include walking, swimming, or biking.  Work with your health care provider or diet and nutrition specialist (dietitian) to adjust your eating plan to your individual calorie needs. Reading food labels  Check food labels for the amount of sodium per serving. Choose foods with less than 5 percent of the Daily Value of sodium. Generally, foods with less than 300 mg of sodium per serving fit into this eating plan.  To find whole grains, look for the word "whole" as the first word in the ingredient list. Shopping  Buy products labeled as "low-sodium" or "no salt added."  Buy fresh foods. Avoid canned foods and premade or frozen meals. Cooking  Avoid adding salt when cooking. Use salt-free seasonings or herbs instead of table salt or sea salt. Check with your health care provider or pharmacist before using salt substitutes.  Do not fry foods. Cook foods using healthy methods such as baking, boiling, grilling, and broiling instead.  Cook with  heart-healthy oils, such as olive, canola, soybean, or sunflower oil. Meal planning   Eat a balanced diet that includes: ? 5 or more servings of fruits and vegetables each day. At each meal, try to fill half of your plate with fruits and vegetables. ? Up to 6-8 servings of whole grains each day. ? Less than 6 oz of lean meat, poultry, or fish each day. A 3-oz serving of meat is about the same size as a deck of cards. One egg equals 1 oz. ? 2 servings of low-fat dairy each day. ? A serving of nuts, seeds, or beans 5 times each week. ? Heart-healthy fats. Healthy fats called Omega-3 fatty acids are found in foods such as flaxseeds and coldwater fish, like sardines, salmon, and mackerel.  Limit how much you eat of the following: ? Canned or prepackaged foods. ? Food that is high in trans fat, such as fried foods. ? Food that is high in saturated fat, such as fatty meat. ? Sweets, desserts, sugary drinks, and other foods with added sugar. ? Full-fat dairy products.  Do not salt foods before eating.  Try to eat at least 2 vegetarian meals each week.  Eat more home-cooked food and less restaurant, buffet, and fast food.  When eating at a restaurant, ask that your food be prepared with less salt or no salt, if possible. What foods are recommended? The items listed may not be a complete list. Talk with your dietitian about what   dietary choices are best for you. Grains Whole-grain or whole-wheat bread. Whole-grain or whole-wheat pasta. Brown rice. Oatmeal. Quinoa. Bulgur. Whole-grain and low-sodium cereals. Pita bread. Low-fat, low-sodium crackers. Whole-wheat flour tortillas. Vegetables Fresh or frozen vegetables (raw, steamed, roasted, or grilled). Low-sodium or reduced-sodium tomato and vegetable juice. Low-sodium or reduced-sodium tomato sauce and tomato paste. Low-sodium or reduced-sodium canned vegetables. Fruits All fresh, dried, or frozen fruit. Canned fruit in natural juice (without  added sugar). Meat and other protein foods Skinless chicken or turkey. Ground chicken or turkey. Pork with fat trimmed off. Fish and seafood. Egg whites. Dried beans, peas, or lentils. Unsalted nuts, nut butters, and seeds. Unsalted canned beans. Lean cuts of beef with fat trimmed off. Low-sodium, lean deli meat. Dairy Low-fat (1%) or fat-free (skim) milk. Fat-free, low-fat, or reduced-fat cheeses. Nonfat, low-sodium ricotta or cottage cheese. Low-fat or nonfat yogurt. Low-fat, low-sodium cheese. Fats and oils Soft margarine without trans fats. Vegetable oil. Low-fat, reduced-fat, or light mayonnaise and salad dressings (reduced-sodium). Canola, safflower, olive, soybean, and sunflower oils. Avocado. Seasoning and other foods Herbs. Spices. Seasoning mixes without salt. Unsalted popcorn and pretzels. Fat-free sweets. What foods are not recommended? The items listed may not be a complete list. Talk with your dietitian about what dietary choices are best for you. Grains Baked goods made with fat, such as croissants, muffins, or some breads. Dry pasta or rice meal packs. Vegetables Creamed or fried vegetables. Vegetables in a cheese sauce. Regular canned vegetables (not low-sodium or reduced-sodium). Regular canned tomato sauce and paste (not low-sodium or reduced-sodium). Regular tomato and vegetable juice (not low-sodium or reduced-sodium). Pickles. Olives. Fruits Canned fruit in a light or heavy syrup. Fried fruit. Fruit in cream or butter sauce. Meat and other protein foods Fatty cuts of meat. Ribs. Fried meat. Bacon. Sausage. Bologna and other processed lunch meats. Salami. Fatback. Hotdogs. Bratwurst. Salted nuts and seeds. Canned beans with added salt. Canned or smoked fish. Whole eggs or egg yolks. Chicken or turkey with skin. Dairy Whole or 2% milk, cream, and half-and-half. Whole or full-fat cream cheese. Whole-fat or sweetened yogurt. Full-fat cheese. Nondairy creamers. Whipped toppings.  Processed cheese and cheese spreads. Fats and oils Butter. Stick margarine. Lard. Shortening. Ghee. Bacon fat. Tropical oils, such as coconut, palm kernel, or palm oil. Seasoning and other foods Salted popcorn and pretzels. Onion salt, garlic salt, seasoned salt, table salt, and sea salt. Worcestershire sauce. Tartar sauce. Barbecue sauce. Teriyaki sauce. Soy sauce, including reduced-sodium. Steak sauce. Canned and packaged gravies. Fish sauce. Oyster sauce. Cocktail sauce. Horseradish that you find on the shelf. Ketchup. Mustard. Meat flavorings and tenderizers. Bouillon cubes. Hot sauce and Tabasco sauce. Premade or packaged marinades. Premade or packaged taco seasonings. Relishes. Regular salad dressings. Where to find more information:  National Heart, Lung, and Blood Institute: www.nhlbi.nih.gov  American Heart Association: www.heart.org Summary  The DASH eating plan is a healthy eating plan that has been shown to reduce high blood pressure (hypertension). It may also reduce your risk for type 2 diabetes, heart disease, and stroke.  With the DASH eating plan, you should limit salt (sodium) intake to 2,300 mg a day. If you have hypertension, you may need to reduce your sodium intake to 1,500 mg a day.  When on the DASH eating plan, aim to eat more fresh fruits and vegetables, whole grains, lean proteins, low-fat dairy, and heart-healthy fats.  Work with your health care provider or diet and nutrition specialist (dietitian) to adjust your eating plan to your individual   calorie needs. This information is not intended to replace advice given to you by your health care provider. Make sure you discuss any questions you have with your health care provider. Document Released: 04/04/2011 Document Revised: 04/08/2016 Document Reviewed: 04/08/2016 Elsevier Interactive Patient Education  2018 [employer]  

## 2017-10-14 NOTE — Progress Notes (Signed)
Name: Valerie Manning   MRN: [id number]    DOB: 07-12-1965   Date:10/15/2017       Progress Note  Subjective  Chief Complaint  Chief Complaint  Patient presents with  . Medication Refill  . Hypertension    She has been prescribed 2 different bp medications. Please confirm which one she is taking. She needs a refill.  . Hyperlipidemia  . Diabetes    Dr. Rosario Jacks is no longer managing her DM. She reports that her bs have been elevated in the mornings. it was 178 this am.  . Hypothyroidism     history of goiter treated with iodine, she has been followed by Dr. Rosario Jacks, taking Synthroid 150 mcg daily. He no longer follows her thyroid    HPI   HTN: bp is elevated, she is taking micardis hctz, she states she has been compliant with her medication but not with a DASH diet, she would like some information about it. No chest pain, palpitation, she states headaches has been better, only happens when eats more salt. She came for bp checks and hovering around 140/90. Discussed adding medication but she would like to hold off for now.   History of iron deficiency anemia: not on supplementation but sates improved after hysterectomy years ago.  Last labs showed low MCV and high RBC count with normal iron studies. She is otherwise asymptomatic, she is not taking iron pills. We may need a sleep study. She denies snoring or daily fatigue   Hypothyroidism: history of goiter treated with iodine, she used to see  Dr. Rosario Jacks, taking Synthroid 150 mcg daily. No dry skin, change in bowel movements or weight.    DMII: she is here now to be managed for all her medical problems including diabetes, no longer seeing Dr. Rosario Jacks, states during the day glucose is around 90's, but in am's is spiking to 190's. She is taking glipizide ER  5 mg twice daily , explained glucose may be dropping during the night. HgbA1C down to 6.0%. We will change to glipizide ER 5 mg in am and increase dose of Metformin to 1500 mg ER daily.  Discussed possible side effects. She denies polyphagia, polydipsia or polyuria. Eye exam is up to date, also foot exam  Hyperlipidemia: taking atorvastatin, denies myalgia.    Patient Active Problem List   Diagnosis Date Noted  . 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 Status post abdominal hysterectomy and left salpingo-oophorectomy 10/25/2015  . Acquired hypothyroidism 03/13/2015  . History of iron deficiency anemia 03/13/2015  . Hypertension 08/15/2014  . Dyslipidemia associated with type 2 diabetes mellitus ([place]) 05/31/2014  . GERD (gastroesophageal reflux disease) 05/31/2014  . Hyperlipidemia 05/31/2014    Past Surgical History:  Procedure Laterality Date  . ABDOMINAL HYSTERECTOMY    . 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 BREAST BIOPSY Right 09/13/2016   Procedure: BREAST BIOPSY WITH NEEDLE LOCALIZATION;  Surgeon: [name], MD;  Location: [facility] ORS;  Service: General;  Laterality: Right;  . BREAST EXCISIONAL BIOPSY Right 06/16/2016   lumpectomy complext sclerosing lesion, benign    Family History  Problem Relation Age of Onset  . Diabetes Mother   . Diabetes Sister   . Heart disease Sister   . Diabetes Sister   . Diabetes Sister   . Thyroid disease Sister   . Diabetes Sister   . Thyroid disease Sister   . Thyroid disease Sister   .  Heart disease Father   . Cancer Neg Hx   . Breast cancer Neg Hx     Social History   Socioeconomic History  . Marital status: Married    Spouse name: Not on file  . Number of children: Not on file  . Years of education: Not on file  . Highest education level: Not on file  Occupational History  . Not on file  Social Needs  . Financial resource strain: Not on file  . Food insecurity:    Worry: Not on file    Inability: Not on file  . Transportation needs:    Medical: Not on file    Non-medical: Not on file  Tobacco Use  . Smoking status: Never Smoker  .  Smokeless tobacco: Never Used  Substance and Sexual Activity  . Alcohol use: No    Alcohol/week: 0.0 oz  . Drug use: No  . Sexual activity: Yes    Birth control/protection: Surgical  Lifestyle  . Physical activity:    Days per week: Not on file    Minutes per session: Not on file  . Stress: Not on file  Relationships  . Social connections:    Talks on phone: Not on file    Gets together: Not on file    Attends religious service: Not on file    Active member of club or organization: Not on file    Attends meetings of clubs or organizations: Not on file    Relationship status: Not on file  . Intimate partner violence:    Fear of current or ex partner: Not on file    Emotionally abused: Not on file    Physically abused: Not on file    Forced sexual activity: Not on file  Other Topics Concern  . Not on file  Social History Narrative  . Not on file     Current Outpatient Medications:  .  aspirin EC 81 MG tablet, Take 81 mg by mouth daily. , Disp: , Rfl:  .  atorvastatin (LIPITOR) 20 MG tablet, Take 1 tablet (20 mg total) by mouth daily., Disp: 90 tablet, Rfl: 1 .  glipiZIDE (GLUCOTROL XL) 5 MG 24 hr tablet, Take 1 tablet (5 mg total) by mouth daily with breakfast., Disp: 90 tablet, Rfl: 0 .  Multiple Vitamin (MULTIVITAMIN) capsule, Take 1 capsule by mouth daily., Disp: , Rfl:  .  omeprazole (PRILOSEC) 20 MG capsule, TAKE 1 CAPSULE(20 MG) BY MOUTH DAILY, Disp: 30 capsule, Rfl: 0 .  SYNTHROID 150 MCG tablet, Take 150 mcg by mouth daily before breakfast. , Disp: , Rfl: 0 .  telmisartan-hydrochlorothiazide (MICARDIS HCT) 80-25 MG tablet, Take 1 tablet by mouth daily., Disp: 90 tablet, Rfl: 1 .  metFORMIN (GLUCOPHAGE-XR) 750 MG 24 hr tablet, Take 2 tablets (1,500 mg total) by mouth daily with breakfast., Disp: 180 tablet, Rfl: 0 .  ranitidine (ZANTAC) 150 MG tablet, Take 1 tablet (150 mg total) by mouth 2 (two) times daily as needed for heartburn., Disp: 180 tablet, Rfl: 0  No Known  Allergies   ROS  Constitutional: Negative for fever or weight change.  Respiratory: Negative for cough and shortness of breath.   Cardiovascular: Negative for chest pain or palpitations.  Gastrointestinal: Negative for abdominal pain, no bowel changes.  Musculoskeletal: Negative for gait problem or joint swelling.  Skin: Negative for rash.  Neurological: Negative for dizziness or headache.  No other specific complaints in a complete review of systems (except as listed in HPI above).  Objective  Vitals:   10/14/17 1553 10/14/17 1627  BP: 140/90 (!) 146/90  Pulse: 74   Resp: 16   SpO2: 96%   Weight: 175 lb 9.6 oz (79.7 kg)   Height: 5\' 7"  (1.702 m)     Body mass index is 27.5 kg/m.  Physical Exam  Constitutional: Patient appears well-developed and well-nourished. Overweight.  No distress.  HEENT: head atraumatic, normocephalic, pupils equal and reactive to light,  neck supple, throat within normal limits Cardiovascular: Normal rate, regular rhythm and normal heart sounds.  No murmur heard. No BLE edema. Pulmonary/Chest: Effort normal and breath sounds normal. No respiratory distress. Abdominal: Soft.  There is no tenderness. Psychiatric: Patient has a normal mood and affect. behavior is normal. Judgment and thought content normal.  Recent Results (from the past 2160 hour(s))  POCT HgB A1C     Status: Abnormal   Collection Time: 10/14/17  4:06 PM  Result Value Ref Range   Hemoglobin A1C 6.0 (A) 4.0 - 5.6 %   HbA1c, POC (prediabetic range)  5.7 - 6.4 %   HbA1c, POC (controlled diabetic range)  0.0 - 7.0 %  TSH     Status: None   Collection Time: 10/14/17  5:22 PM  Result Value Ref Range   TSH 0.357 0.350 - 4.500 uIU/mL    Comment: Performed by a 3rd Generation assay with a functional sensitivity of <=0.01 uIU/mL. Performed at [facility], [address] [place], [place] [address]      PHQ2/9: Depression screen [facility] 2/9 10/14/2017 01/27/2017 10/21/2016  09/11/2016 05/06/2016  Decreased Interest 0 0 0 0 0  Down, Depressed, Hopeless 0 0 0 0 0  PHQ - 2 Score 0 0 0 0 0     Fall Risk: Fall Risk  10/14/2017 07/11/2017 01/27/2017 10/21/2016 09/11/2016  Falls in the past year? No No Yes No No  Number falls in past yr: - - 1 - -  Injury with Fall? - - Yes - -     Functional Status Survey: Is the patient deaf or have difficulty hearing?: No Does the patient have difficulty seeing, even when wearing glasses/contacts?: No Does the patient have difficulty concentrating, remembering, or making decisions?: No Does the patient have difficulty walking or climbing stairs?: No Does the patient have difficulty dressing or bathing?: No Does the patient have difficulty doing errands alone such as visiting a doctor's office or shopping?: No    Assessment & Plan  1. Dyslipidemia associated with type 2 diabetes mellitus ([facility])  - POCT HgB A1C - glipiZIDE (GLUCOTROL XL) 5 MG 24 hr tablet; Take 1 tablet (5 mg total) by mouth daily with breakfast.  Dispense: 90 tablet; Refill: 0 - metFORMIN (GLUCOPHAGE-XR) 750 MG 24 hr tablet; Take 2 tablets (1,500 mg total) by mouth daily with breakfast.  Dispense: 180 tablet; Refill: 0  2. Hypertension, benign  Continue medication, she will try harder on life style modification, goal is bp below 135/85  3. Reflux esophagitis  Try to wean off PPI and try Ranitidine - ranitidine (ZANTAC) 150 MG tablet; Take 1 tablet (150 mg total) by mouth 2 (two) times daily as needed for heartburn.  Dispense: 180 tablet; Refill: 0  4. Acquired hypothyroidism  - TSH

## 2017-10-22 ENCOUNTER — Other Ambulatory Visit: Payer: Self-pay | Admitting: Family Medicine

## 2017-10-22 NOTE — Telephone Encounter (Signed)
Refill request for thyroid medication. Synthroid to [facility].   Last visit: 10/14/17  Lab Results  Component Value Date   TSH 0.357 10/14/2017    Follow up on 01/16/2018

## 2017-11-07 NOTE — Progress Notes (Signed)
Patient ID: Valerie Manning, female   DOB: 10-14-1965, 52 y.o.   MRN: [id number] ANNUAL PREVENTATIVE CARE GYN  ENCOUNTER NOTE  Subjective:       Valerie Manning is a 52 y.o. [id number] female here for a routine annual gynecologic exam.  Current complaints:  1.  S/p TAH LSO R salpingectomy; slight vaginal dryness, night sweats, and hot flashes, increasing; patient does use over-the-counter lubricants for vaginal dryness  2. Palms of rt hand; several small flat warts are noted on palms as well as right foot.;  Nonpainful, nonerosive, non-weeping.  No skin rashes, no skin ulcers, no hair loss, no fever, no chills, no sweats, no mouth ulcers  Gynecologic History No LMP recorded. Patient has had a hysterectomy. Contraception: status post hysterectomy Last Pap: n/a. Results were: n/a Last mammogram: 07/29/2017 birad 1 Results were: abnormal  Obstetric History OB History  Gravida Para Term Preterm AB Living  2 2 2     2   SAB TAB Ectopic Multiple Live Births          2    # Outcome Date GA Lbr Len/2nd Weight Sex Delivery Anes PTL Lv  2 Term 1996   7 lb 6.4 oz (3.357 kg) F Vag-Spont   LIV  1 Term 1993   7 lb 11.2 oz (3.493 kg) F Vag-Spont   LIV    Obstetric Comments  1st Menstrual Cycle:  13  1st Pregnancy:  26    Past Medical History:  Diagnosis Date  . Cramp in muscle    H/O OF MUSCLE CRAMP IN CHEST AND STOMACH OCCAS WHEN BENDS AT WAIST. WHEN STANDS AND BENDS BACK HEARS POP. CRAMP GOES AWAY. HAS NOT HAPPENES IN MANY MONTHS  . Diabetes mellitus without complication ([place])   . Hyperlipidemia   . Hypertension   . Left ear impacted cerumen 03/31/2017    Past Surgical History:  Procedure Laterality Date  . ABDOMINAL HYSTERECTOMY    . 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 BREAST BIOPSY Right 09/13/2016   Procedure: BREAST BIOPSY WITH NEEDLE LOCALIZATION;  Surgeon: [name], MD;  Location: [facility] ORS;  Service: General;   Laterality: Right;  . BREAST EXCISIONAL BIOPSY Right 06/16/2016   lumpectomy complext sclerosing lesion, benign    Current Outpatient Medications on File Prior to Visit  Medication Sig Dispense Refill  . aspirin EC 81 MG tablet Take 81 mg by mouth daily.     Marland Kitchen atorvastatin (LIPITOR) 20 MG tablet Take 1 tablet (20 mg total) by mouth daily. 90 tablet 1  . glipiZIDE (GLUCOTROL XL) 5 MG 24 hr tablet Take 1 tablet (5 mg total) by mouth daily with breakfast. 90 tablet 0  . levothyroxine (SYNTHROID, LEVOTHROID) 150 MCG tablet TAKE 1 TABLET ONCE A DAY ORALLY IN THE MORNING 90 tablet 0  . metFORMIN (GLUCOPHAGE-XR) 750 MG 24 hr tablet Take 2 tablets (1,500 mg total) by mouth daily with breakfast. 180 tablet 0  . Multiple Vitamin (MULTIVITAMIN) capsule Take 1 capsule by mouth daily.    Marland Kitchen omeprazole (PRILOSEC) 20 MG capsule TAKE 1 CAPSULE(20 MG) BY MOUTH DAILY 30 capsule 0  . ranitidine (ZANTAC) 150 MG tablet Take 1 tablet (150 mg total) by mouth 2 (two) times daily as needed for heartburn. 180 tablet 0  . telmisartan-hydrochlorothiazide (MICARDIS HCT) 80-25 MG tablet Take 1 tablet by mouth daily. 90 tablet 1   No current facility-administered medications on file prior to  visit.     No Known Allergies  Social History   Socioeconomic History  . Marital status: Married    Spouse name: Not on file  . Number of children: Not on file  . Years of education: Not on file  . Highest education level: Not on file  Occupational History  . Not on file  Social Needs  . Financial resource strain: Not on file  . Food insecurity:    Worry: Not on file    Inability: Not on file  . Transportation needs:    Medical: Not on file    Non-medical: Not on file  Tobacco Use  . Smoking status: Never Smoker  . Smokeless tobacco: Never Used  Substance and Sexual Activity  . Alcohol use: No    Alcohol/week: 0.0 oz  . Drug use: No  . Sexual activity: Yes    Birth control/protection: Surgical  Lifestyle  .  Physical activity:    Days per week: Not on file    Minutes per session: Not on file  . Stress: Not on file  Relationships  . Social connections:    Talks on phone: Not on file    Gets together: Not on file    Attends religious service: Not on file    Active member of club or organization: Not on file    Attends meetings of clubs or organizations: Not on file    Relationship status: Not on file  . Intimate partner violence:    Fear of current or ex partner: Not on file    Emotionally abused: Not on file    Physically abused: Not on file    Forced sexual activity: Not on file  Other Topics Concern  . Not on file  Social History Narrative  . Not on file    Family History  Problem Relation Age of Onset  . Diabetes Mother   . Diabetes Sister   . Heart disease Sister   . Diabetes Sister   . Diabetes Sister   . Thyroid disease Sister   . Diabetes Sister   . Thyroid disease Sister   . Thyroid disease Sister   . Heart disease Father   . Cancer Neg Hx   . Breast cancer Neg Hx     The following portions of the patient's history were reviewed and updated as appropriate: allergies, current medications, past family history, past medical history, past social history, past surgical history and problem list.  Review of Systems Review of Systems  Constitutional:       I flashes and night sweats, worsening  HENT: Negative.   Eyes: Negative.   Respiratory: Negative.   Cardiovascular: Negative.   Gastrointestinal: Negative.   Genitourinary: Negative.   Musculoskeletal: Negative.   Skin: Negative for itching and rash.  Neurological: Negative.   Endo/Heme/Allergies: Negative.   Psychiatric/Behavioral: Negative.       Objective:   BP 136/83   Pulse 81   Ht 5\' 7"  (1.702 m)   Wt 171 lb 8 oz (77.8 kg)   BMI 26.86 kg/m  CONSTITUTIONAL: Well-developed, well-nourished female in no acute distress.  PSYCHIATRIC: Normal mood and affect. Normal behavior. Normal judgment and thought  content. [place]: Alert and oriented to person, place, and time. Normal muscle tone coordination. No cranial nerve deficit noted. HENT:  Normocephalic, atraumatic, External right and left ear normal. Oropharynx is clear and moist  EYES: Conjunctivae and EOM are normal. No scleral icterus.  NECK: Normal range of motion, supple, no masses.  Normal thyroid.  SKIN: Skin is warm and dry. No rash noted. Not diaphoretic. No erythema. No pallor.  Few 3 to 5 mm flat warts noted on hands and right foot, non-weeping, nonerythematous, non-raised, nontender CARDIOVASCULAR: Normal heart rate noted, regular rhythm, no murmur. RESPIRATORY: Clear to auscultation bilaterally. Effort and breath sounds normal, no problems with respiration noted. BREASTS: Symmetric in size. No masses, skin changes, nipple drainage, or lymphadenopathy. 4 cm biopsy scar noted on right breast,Well-healed ABDOMEN: Soft, normal bowel sounds, no distention noted.  No tenderness, rebound or guarding.  BLADDER: Normal PELVIC:  External Genitalia: Normal  BUS: Normal  Vagina: Mild atrophy; no significant discharge, normal secretions  Cervix: Surgically absent  Uterus: Surgically absent  Adnexa: Normal, nonpalpable, nontender  RV: External hemorrhoids, nonthrombosed., No Rectal Masses and Normal Sphincter tone  MUSCULOSKELETAL: Normal range of motion. No tenderness.  No cyanosis, clubbing, or edema.  2+ distal pulses. LYMPHATIC: No Axillary, Supraclavicular, or Inguinal Adenopathy.    Assessment:   Annual gynecologic examination 52 y.o. Contraception: status post hysterectomy BMI- 26 Problem List Items Addressed This Visit    Status post abdominal hysterectomy and left salpingo-oophorectomy    Other Visit Diagnoses    Well woman exam    -  Primary   Screen for colon cancer       Increased BMI        Worsening vasomotor symptoms Vaginal atrophy, mild  Plan:  Pap: Not needed Mammogram: Already done this year and Not  Ordered Stool Guaiac Testing:  Ordered Labs: thru pcp  Routine preventative health maintenance measures emphasized: Exercise/Diet/Weight control, Tobacco Warnings and Alcohol/Substance use risks Trial of estradiol 1/2 mg daily Follow-up in 6 weeks Follow-up in 1 year for annual exam  Joyice Faster, CMA  Brayton Mars, MD  Note: This dictation was prepared with Dragon dictation along with smaller phrase technology. Any transcriptional errors that result from this process are unintentional.

## 2017-11-11 ENCOUNTER — Ambulatory Visit (INDEPENDENT_AMBULATORY_CARE_PROVIDER_SITE_OTHER): Payer: Federal, State, Local not specified - PPO | Admitting: Obstetrics and Gynecology

## 2017-11-11 ENCOUNTER — Encounter: Payer: Self-pay | Admitting: Obstetrics and Gynecology

## 2017-11-11 VITALS — BP 136/83 | HR 81 | Ht 67.0 in | Wt 171.5 lb

## 2017-11-11 DIAGNOSIS — Z90710 Acquired absence of both cervix and uterus: Secondary | ICD-10-CM | POA: Diagnosis not present

## 2017-11-11 DIAGNOSIS — R638 Other symptoms and signs concerning food and fluid intake: Secondary | ICD-10-CM

## 2017-11-11 DIAGNOSIS — Z1211 Encounter for screening for malignant neoplasm of colon: Secondary | ICD-10-CM | POA: Diagnosis not present

## 2017-11-11 DIAGNOSIS — Z9079 Acquired absence of other genital organ(s): Secondary | ICD-10-CM

## 2017-11-11 DIAGNOSIS — Z01419 Encounter for gynecological examination (general) (routine) without abnormal findings: Secondary | ICD-10-CM | POA: Diagnosis not present

## 2017-11-11 DIAGNOSIS — N951 Menopausal and female climacteric states: Secondary | ICD-10-CM

## 2017-11-11 DIAGNOSIS — Z90721 Acquired absence of ovaries, unilateral: Secondary | ICD-10-CM | POA: Diagnosis not present

## 2017-11-11 MED ORDER — ESTRADIOL 0.5 MG PO TABS: 0.5000 mg | ORAL_TABLET | Freq: Every day | ORAL | 6 refills | Status: DC

## 2017-11-11 NOTE — Patient Instructions (Addendum)
1.  No Pap smear is done.  No further Pap smears are needed 2.  Mammogram already done this year 3.  Screening labs are performed by primary care 4.  Stool guaiac cards are given for colon cancer screening 5.  Begin a trial of ERT in the form of estradiol 1/2 mg a day 6.  Return in 6 weeks for follow-up on ERT therapy 7.  Return in 1 year for annual exam   Health Maintenance for Postmenopausal Women Menopause is a normal process in which your reproductive ability comes to an end. This process happens gradually over a span of months to years, usually between the ages of 14 and 71. Menopause is complete when you have missed 12 consecutive menstrual periods. It is important to talk with your health care provider about some of the most common conditions that affect postmenopausal women, such as heart disease, cancer, and bone loss (osteoporosis). Adopting a healthy lifestyle and getting preventive care can help to promote your health and wellness. Those actions can also lower your chances of developing some of these common conditions. What should I know about menopause? During menopause, you may experience a number of symptoms, such as:  Moderate-to-severe hot flashes.  Night sweats.  Decrease in sex drive.  Mood swings.  Headaches.  Tiredness.  Irritability.  Memory problems.  Insomnia.  Choosing to treat or not to treat menopausal changes is an individual decision that you make with your health care provider. What should I know about hormone replacement therapy and supplements? Hormone therapy products are effective for treating symptoms that are associated with menopause, such as hot flashes and night sweats. Hormone replacement carries certain risks, especially as you become older. If you are thinking about using estrogen or estrogen with progestin treatments, discuss the benefits and risks with your health care provider. What should I know about heart disease and stroke? Heart  disease, heart attack, and stroke become more likely as you age. This may be due, in part, to the hormonal changes that your body experiences during menopause. These can affect how your body processes dietary fats, triglycerides, and cholesterol. Heart attack and stroke are both medical emergencies. There are many things that you can do to help prevent heart disease and stroke:  Have your blood pressure checked at least every 1-2 years. High blood pressure causes heart disease and increases the risk of stroke.  If you are 74-66 years old, ask your health care provider if you should take aspirin to prevent a heart attack or a stroke.  Do not use any tobacco products, including cigarettes, chewing tobacco, or electronic cigarettes. If you need help quitting, ask your health care provider.  It is important to eat a healthy diet and maintain a healthy weight. ? Be sure to include plenty of vegetables, fruits, low-fat dairy products, and lean protein. ? Avoid eating foods that are high in solid fats, added sugars, or salt (sodium).  Get regular exercise. This is one of the most important things that you can do for your health. ? Try to exercise for at least 150 minutes each week. The type of exercise that you do should increase your heart rate and make you sweat. This is known as moderate-intensity exercise. ? Try to do strengthening exercises at least twice each week. Do these in addition to the moderate-intensity exercise.  Know your numbers.Ask your health care provider to check your cholesterol and your blood glucose. Continue to have your blood tested as directed by  your health care provider.  What should I know about cancer screening? There are several types of cancer. Take the following steps to reduce your risk and to catch any cancer development as early as possible. Breast Cancer  Practice breast self-awareness. ? This means understanding how your breasts normally appear and feel. ? It  also means doing regular breast self-exams. Let your health care provider know about any changes, no matter how small.  If you are 73 or older, have a clinician do a breast exam (clinical breast exam or CBE) every year. Depending on your age, family history, and medical history, it may be recommended that you also have a yearly breast X-ray (mammogram).  If you have a family history of breast cancer, talk with your health care provider about genetic screening.  If you are at high risk for breast cancer, talk with your health care provider about having an MRI and a mammogram every year.  Breast cancer (BRCA) gene test is recommended for women who have family members with BRCA-related cancers. Results of the assessment will determine the need for genetic counseling and BRCA1 and for BRCA2 testing. BRCA-related cancers include these types: ? Breast. This occurs in males or females. ? Ovarian. ? Tubal. This may also be called fallopian tube cancer. ? Cancer of the abdominal or pelvic lining (peritoneal cancer). ? Prostate. ? Pancreatic.  Cervical, Uterine, and Ovarian Cancer Your health care provider may recommend that you be screened regularly for cancer of the pelvic organs. These include your ovaries, uterus, and vagina. This screening involves a pelvic exam, which includes checking for microscopic changes to the surface of your cervix (Pap test).  For women ages 21-65, health care providers may recommend a pelvic exam and a Pap test every three years. For women ages 35-65, they may recommend the Pap test and pelvic exam, combined with testing for human papilloma virus (HPV), every five years. Some types of HPV increase your risk of cervical cancer. Testing for HPV may also be done on women of any age who have unclear Pap test results.  Other health care providers may not recommend any screening for nonpregnant women who are considered low risk for pelvic cancer and have no symptoms. Ask your  health care provider if a screening pelvic exam is right for you.  If you have had past treatment for cervical cancer or a condition that could lead to cancer, you need Pap tests and screening for cancer for at least 20 years after your treatment. If Pap tests have been discontinued for you, your risk factors (such as having a new sexual partner) need to be reassessed to determine if you should start having screenings again. Some women have medical problems that increase the chance of getting cervical cancer. In these cases, your health care provider may recommend that you have screening and Pap tests more often.  If you have a family history of uterine cancer or ovarian cancer, talk with your health care provider about genetic screening.  If you have vaginal bleeding after reaching menopause, tell your health care provider.  There are currently no reliable tests available to screen for ovarian cancer.  Lung Cancer Lung cancer screening is recommended for adults 44-40 years old who are at high risk for lung cancer because of a history of smoking. A yearly low-dose CT scan of the lungs is recommended if you:  Currently smoke.  Have a history of at least 30 pack-years of smoking and you currently smoke or  have quit within the past 15 years. A pack-year is smoking an average of one pack of cigarettes per day for one year.  Yearly screening should:  Continue until it has been 15 years since you quit.  Stop if you develop a health problem that would prevent you from having lung cancer treatment.  Colorectal Cancer  This type of cancer can be detected and can often be prevented.  Routine colorectal cancer screening usually begins at age 54 and continues through age 44.  If you have risk factors for colon cancer, your health care provider may recommend that you be screened at an earlier age.  If you have a family history of colorectal cancer, talk with your health care provider about genetic  screening.  Your health care provider may also recommend using home test kits to check for hidden blood in your stool.  A small camera at the end of a tube can be used to examine your colon directly (sigmoidoscopy or colonoscopy). This is done to check for the earliest forms of colorectal cancer.  Direct examination of the colon should be repeated every 5-10 years until age 71. However, if early forms of precancerous polyps or small growths are found or if you have a family history or genetic risk for colorectal cancer, you may need to be screened more often.  Skin Cancer  Check your skin from head to toe regularly.  Monitor any moles. Be sure to tell your health care provider: ? About any new moles or changes in moles, especially if there is a change in a mole's shape or color. ? If you have a mole that is larger than the size of a pencil eraser.  If any of your family members has a history of skin cancer, especially at a young age, talk with your health care provider about genetic screening.  Always use sunscreen. Apply sunscreen liberally and repeatedly throughout the day.  Whenever you are outside, protect yourself by wearing long sleeves, pants, a wide-brimmed hat, and sunglasses.  What should I know about osteoporosis? Osteoporosis is a condition in which bone destruction happens more quickly than new bone creation. After menopause, you may be at an increased risk for osteoporosis. To help prevent osteoporosis or the bone fractures that can happen because of osteoporosis, the following is recommended:  If you are 32-28 years old, get at least 1,000 mg of calcium and at least 600 mg of vitamin D per day.  If you are older than age 86 but younger than age 1, get at least 1,200 mg of calcium and at least 600 mg of vitamin D per day.  If you are older than age 66, get at least 1,200 mg of calcium and at least 800 mg of vitamin D per day.  Smoking and excessive alcohol intake  increase the risk of osteoporosis. Eat foods that are rich in calcium and vitamin D, and do weight-bearing exercises several times each week as directed by your health care provider. What should I know about how menopause affects my mental health? Depression may occur at any age, but it is more common as you become older. Common symptoms of depression include:  Low or sad mood.  Changes in sleep patterns.  Changes in appetite or eating patterns.  Feeling an overall lack of motivation or enjoyment of activities that you previously enjoyed.  Frequent crying spells.  Talk with your health care provider if you think that you are experiencing depression. What should I know  about immunizations? It is important that you get and maintain your immunizations. These include:  Tetanus, diphtheria, and pertussis (Tdap) booster vaccine.  Influenza every year before the flu season begins.  Pneumonia vaccine.  Shingles vaccine.  Your health care provider may also recommend other immunizations. This information is not intended to replace advice given to you by your health care provider. Make sure you discuss any questions you have with your health care provider. Document Released: 06/07/2005 Document Revised: 11/03/2015 Document Reviewed: 01/17/2015 Elsevier Interactive Patient Education  2018 [employer].

## 2017-11-13 ENCOUNTER — Telehealth: Payer: Self-pay | Admitting: Obstetrics and Gynecology

## 2017-11-13 DIAGNOSIS — Z1211 Encounter for screening for malignant neoplasm of colon: Secondary | ICD-10-CM | POA: Diagnosis not present

## 2017-11-13 NOTE — Telephone Encounter (Signed)
The patient called and stated that she would like to speak with her nurse in regards to some of her concerns about the side effects to her medication that was prescribed yesterday. No other information was disclosed. Please advise.

## 2017-11-13 NOTE — Telephone Encounter (Signed)
Pt was erx estradiol 0.5mg  qd at [facility]. She was reading the s/e and is a little leery. Advised pt she did not have to take med if she did not want to. Informed pt there are risk with all meds. Pt states she will try it. Advised to contact office if any concerns once she starts the med. 6 week f/u appt made.

## 2017-11-20 LAB — FECAL OCCULT BLOOD, IMMUNOCHEMICAL: FECAL OCCULT BLD: NEGATIVE

## 2017-12-25 ENCOUNTER — Encounter: Payer: Self-pay | Admitting: Obstetrics and Gynecology

## 2017-12-25 ENCOUNTER — Ambulatory Visit: Payer: Federal, State, Local not specified - PPO | Admitting: Obstetrics and Gynecology

## 2017-12-25 VITALS — BP 149/87 | HR 87 | Ht 61.0 in | Wt 173.7 lb

## 2017-12-25 DIAGNOSIS — N951 Menopausal and female climacteric states: Secondary | ICD-10-CM | POA: Diagnosis not present

## 2017-12-25 DIAGNOSIS — Z79899 Other long term (current) drug therapy: Secondary | ICD-10-CM | POA: Diagnosis not present

## 2017-12-25 NOTE — Patient Instructions (Signed)
1.  Continue taking the estradiol tablet 1/2 tablet a day for 2 weeks, and keep track of hot flash and night sweats side effects. 2.  After 2 weeks take the estradiol tablet 1 tablet daily for 2 weeks, and again keep track of hot flashes and night sweats side effects. 3.  Return in 4 weeks for follow-up. 4.  Begin taking Citracal plus tablets-1 in the morning and 1 in the evening for calcium supplementation.  (Citracal) Calcium; Vitamin D chewable tablet What is this medicine? CALCIUM; VITAMIN D (KAL see um; VYE ta min D) is a vitamin supplement. It is used to prevent conditions of low calcium and vitamin D. This medicine may be used for other purposes; ask your health care provider or pharmacist if you have questions. COMMON BRAND NAME(S): Calcet Citrate Soft Chew, Caltrate 600+D, Citracal Calcium, Citracal Creamy Bites, Os-Cal 500 Plus D, OSCAL with Vitamin D3, Oysco 500 + D What should I tell my health care provider before I take this medicine? They need to know if you have any of these conditions: -constipation -dehydration -heart disease -high level of calcium or vitamin D in the blood -high level of phosphate in the blood -kidney disease -kidney stones -liver disease -parathyroid disease -sarcoidosis -stomach ulcer or obstruction -an unusual or allergic reaction to calcium, vitamin D, tartrazine dye, other medicines, foods, dyes, or preservatives -pregnant or trying to get pregnant -breast-feeding How should I use this medicine? Take this medicine by mouth. Chew it completely before swallowing. Follow the directions on the prescription label. Take with food or within 1 hour after a meal. Take your medicine at regular intervals. Do not take your medicine more often than directed. Talk to your pediatrician regarding the use of this medicine in children. While this medicine may be used in children for selected conditions, precautions do apply. Overdosage: If you think you have taken  too much of this medicine contact a poison control center or emergency room at once. NOTE: This medicine is only for you. Do not share this medicine with others. What if I miss a dose? If you miss a dose, take it as soon as you can. If it is almost time for your next dose, take only that dose. Do not take double or extra doses. What may interact with this medicine? Do not take this medicine with any of the following medications: -ammonium chloride -methenamine This medicine may also interact with the following medications: -antibiotics like ciprofloxacin, gatifloxacin, tetracycline -captopril -delavirdine -diuretics -gabapentin -iron supplements -medicines for fungal infections like ketoconazole and itraconazole -medicines for seizures like ethotoin and phenytoin -mineral oil -mycophenolate -other vitamins with calcium, vitamin D, or minerals -quinidine -rosuvastatin -sucralfate -thyroid medicine This list may not describe all possible interactions. Give your health care provider a list of all the medicines, herbs, non-prescription drugs, or dietary supplements you use. Also tell them if you smoke, drink alcohol, or use illegal drugs. Some items may interact with your medicine. What should I watch for while using this medicine? Taking this medicine is not a substitute for a well-balanced diet and exercise. Talk with your doctor or health care provider and follow a healthy lifestyle. Do not take this medicine with high-fiber foods, large amounts of alcohol, or drinks containing caffeine. Do not take this medicine within 2 hours of any other medicines. What side effects may I notice from receiving this medicine? Side effects that you should report to your doctor or health care professional as soon as possible: -allergic reactions  like skin rash, itching or hives, swelling of the face, lips, or tongue -confusion -dry mouth -high blood pressure -increased hunger or thirst -increased  urination -irregular heartbeat -metallic taste -muscle or bone pain -pain when urinating -seizure -unusually weak or tired -weight loss Side effects that usually do not require medical attention (report to your doctor or health care professional if they continue or are bothersome): -constipation -diarrhea -headache -loss of appetite -nausea, vomiting -stomach upset This list may not describe all possible side effects. Call your doctor for medical advice about side effects. You may report side effects to FDA at 1-800-FDA-1088. Where should I keep my medicine? Keep out of the reach of children. Store at room temperature between 15 and 30 degrees C (59 and 86 degrees F). Protect from light. Keep container tightly closed. Throw away any unused medicine after the expiration date. NOTE: This sheet is a summary. It may not cover all possible information. If you have questions about this medicine, talk to your doctor, pharmacist, or health care provider.  2018 Elsevier/Gold Standard (2007-07-29 17:57:27)

## 2017-12-25 NOTE — Progress Notes (Signed)
Chief complaint: 1.  Conference-15 minutes 2.  Menopausal vasomotor symptoms 3.  Medication management  Arloa presents today for follow-up on ERT therapy.  She was started on S estradiol 0.5 mg daily.  However, because of fear regarding side effects, she only took 1/2 tablet a day since starting the medication. She has noted a decrease in frequency and severity of the hot flashes and night sweats.  She states that she does not have the sudden immediate surge of heat but she still gets warm.  She states that she does continue to get hot at night with sweats where she would change her bed close.  The overall frequency of these episodes has diminished since starting the medication.  Polyp denies having shortness of breath, chest pain, swelling, weight gain, nausea, breast tenderness, headaches.  OBJECTIVE: BP (!) 149/87   Pulse 87   Ht 5\' 1"  (1.549 m)   Wt 173 lb 11.2 oz (78.8 kg)   BMI 32.82 kg/m  Pleasant well-appearing female in no acute distress.  Alert and oriented. Physical exam deferred  ASSESSMENT: 1.  Menopausal vasomotor symptoms, improved with lower than average dose of ERT therapy 2.  Noncompliance with prescribed dosage due to side effect fears  PLAN: 1.  Patient is counseled regarding the pros and cons of ERT use. 2.  Patient is counseled regarding the planned stepwise increase in estradiol dosage-  Take 1/2 tablet of 0.5 mg estradiol daily for 2 weeks while monitoring hot flashes and night sweats,  Then take 1 tablet of 0.5 mg estradiol daily for 2 weeks for monitoring hot flash and night sweats frequency 3.  Return in 4 weeks to assess impact on symptomatology and potential for side effects 4.  Begin taking calcium with vitamin D supplementation in the form of Citracal plus twice a day in order to help with osteoporosis prevention.  A total of 15 minutes were spent face-to-face with the patient during this encounter and over half of that time dealt with counseling and  coordination of care.  Brayton Mars, MD  Note: This dictation was prepared with Dragon dictation along with smaller phrase technology. Any transcriptional errors that result from this process are unintentional.

## 2018-01-02 ENCOUNTER — Telehealth: Payer: Self-pay | Admitting: Family Medicine

## 2018-01-02 NOTE — Telephone Encounter (Unsigned)
Copied from [place] [id number]. Topic: General - Other >> Jan 02, 2018 11:08 AM Judyann Munson wrote: Reason for CRM:  patient is calling to request if her metFORMIN (GLUCOPHAGE-XR) 750 MG 24 hr tablet can be changed to smaller pill. It is difficult for her to take.   Preferred pharmacy:[id number] [name], [place] [address] [phone] (Phone) [phone] (Fax)  Please advise

## 2018-01-02 NOTE — Telephone Encounter (Signed)
Patient notified to split dosage up instead of taking both at breakfast to see if that helps. Also Dr. Ancil Boozer did not switch medication until she saw her to make sure the medication is controlling her sugar.

## 2018-01-02 NOTE — Telephone Encounter (Signed)
Last rx was sent June 2019, can she take them separately until her follow up in one month? I prefer discussing with her and even see if medication is working, we may have to change to something else on her next visit

## 2018-01-12 ENCOUNTER — Other Ambulatory Visit: Payer: Self-pay | Admitting: Family Medicine

## 2018-01-12 DIAGNOSIS — E785 Hyperlipidemia, unspecified: Principal | ICD-10-CM

## 2018-01-12 DIAGNOSIS — E1169 Type 2 diabetes mellitus with other specified complication: Secondary | ICD-10-CM

## 2018-01-12 NOTE — Telephone Encounter (Signed)
Request for diabetes medication. Metformin to [facility].  Last office visit pertaining to diabetes: 10/14/2017   Lab Results  Component Value Date   HGBA1C 6.0 (A) 10/14/2017     Follow up on 01/30/2018

## 2018-01-16 ENCOUNTER — Ambulatory Visit: Payer: Federal, State, Local not specified - PPO | Admitting: Family Medicine

## 2018-01-21 ENCOUNTER — Other Ambulatory Visit: Payer: Self-pay | Admitting: Family Medicine

## 2018-01-26 LAB — HM DIABETES EYE EXAM

## 2018-01-27 ENCOUNTER — Encounter: Payer: Self-pay | Admitting: Obstetrics and Gynecology

## 2018-01-27 ENCOUNTER — Ambulatory Visit: Payer: Federal, State, Local not specified - PPO | Admitting: Obstetrics and Gynecology

## 2018-01-27 VITALS — BP 130/81 | HR 82 | Ht 61.0 in | Wt 175.2 lb

## 2018-01-27 DIAGNOSIS — N951 Menopausal and female climacteric states: Secondary | ICD-10-CM | POA: Diagnosis not present

## 2018-01-27 DIAGNOSIS — Z79899 Other long term (current) drug therapy: Secondary | ICD-10-CM

## 2018-01-27 NOTE — Progress Notes (Signed)
Chief complaint: 1.  Vasomotor symptoms 2.  Medication management  Valerie Manning presents today for conference to discuss management of hot flashes and night sweats.  Previously she was taking a markedly decreased dosage of estradiol 0.5 mg NOT daily.  Over the past month she has gradually increased her dosage from 1/2 tablet of 0.5 mg estradiol daily to over the past 2 weeks 1 tablet of 0.5 mg daily.  She is not experiencing any side effects.  She has noted a resolution of her flushing and night sweats.  She is comfortable with medication dosage.  Valerie Manning continues to take her calcium and vitamin D supplementation.  Past Medical History:  Diagnosis Date  . Cramp in muscle    H/O OF MUSCLE CRAMP IN CHEST AND STOMACH OCCAS WHEN BENDS AT WAIST. WHEN STANDS AND BENDS BACK HEARS POP. CRAMP GOES AWAY. HAS NOT HAPPENES IN MANY MONTHS  . Diabetes mellitus without complication ([place])   . Hyperlipidemia   . Hypertension   . Left ear impacted cerumen 03/31/2017   Past Surgical History:  Procedure Laterality Date  . ABDOMINAL HYSTERECTOMY    . 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 BREAST BIOPSY Right 09/13/2016   Procedure: BREAST BIOPSY WITH NEEDLE LOCALIZATION;  Surgeon: [name], MD;  Location: [facility] ORS;  Service: General;  Laterality: Right;  . BREAST EXCISIONAL BIOPSY Right 06/16/2016   lumpectomy complext sclerosing lesion, benign     OBJECTIVE: BP 130/81   Pulse 82   Ht 5\' 1"  (1.549 m)   Wt 175 lb 3.2 oz (79.5 kg)   BMI 33.10 kg/m  Physical exam-deferred  ASSESSMENT: 1.  Vasomotor symptoms secondary to menopause, resolved with dosage adjustment of ERT 2.  Patient continues to take calcium and vitamin D supplementation for prevention of osteoporosis.  PLAN: 1.  Continue taking calcium and vitamin D supplementation twice a day 2.  Continue taking estradiol 0.5 mg daily 3.  Monitor for any recurrence of vasomotor  symptoms 4.  Continue with healthy eating and exercise 5.  Return for annual exam in 1 year as scheduled.  A total of 15 minutes were spent face-to-face with the patient during this encounter and over half of that time dealt with counseling and coordination of care.  Hassell Done A Valerie Mcatee, MD  Note: This dictation was prepared with Dragon dictation along with smaller phrase technology. Any transcriptional errors that result from this process are unintentional.

## 2018-01-27 NOTE — Patient Instructions (Signed)
1.  Continue taking estradiol 0.5 mg daily. 2.  Continue taking calcium with vitamin D supplementation twice a day 3.  Return for annual exam as scheduled.

## 2018-01-30 ENCOUNTER — Ambulatory Visit: Payer: Federal, State, Local not specified - PPO | Admitting: Family Medicine

## 2018-02-05 DIAGNOSIS — K080 Exfoliation of teeth due to systemic causes: Secondary | ICD-10-CM | POA: Diagnosis not present

## 2018-02-09 ENCOUNTER — Other Ambulatory Visit: Payer: Self-pay | Admitting: Family Medicine

## 2018-02-09 NOTE — Telephone Encounter (Signed)
Copied from [place] [id number]. Topic: Quick Communication - Rx Refill/Question >> Feb 09, 2018  4:48 PM Valerie Manning wrote: Medication: One touch verio flex test strips  Has the patient contacted their pharmacy? Yes.   (Agent: If no, request that the patient contact the pharmacy for the refill.) (Agent: If yes, when and what did the pharmacy advise?) does not have it on file  Preferred Pharmacy (with phone number or street name): [id number] [name], [place] [address] [place] [address] Phone: [phone] Fax: [phone]  Agent: Please be advised that RX refills may take up to 3 business days. We ask that you follow-up with your pharmacy.

## 2018-02-10 ENCOUNTER — Ambulatory Visit: Payer: Federal, State, Local not specified - PPO | Admitting: Family Medicine

## 2018-02-10 ENCOUNTER — Encounter: Payer: Self-pay | Admitting: Family Medicine

## 2018-02-10 VITALS — BP 128/88 | HR 104 | Temp 98.4°F | Resp 16 | Ht 61.0 in | Wt 173.9 lb

## 2018-02-10 DIAGNOSIS — J069 Acute upper respiratory infection, unspecified: Secondary | ICD-10-CM

## 2018-02-10 MED ORDER — BENZONATATE 100 MG PO CAPS: 100.0000 mg | ORAL_CAPSULE | Freq: Two times a day (BID) | ORAL | 0 refills | Status: DC | PRN

## 2018-02-10 MED ORDER — FIRST-DUKES MOUTHWASH MT SUSP: 15.0000 mL | Freq: Three times a day (TID) | OROMUCOSAL | 0 refills | Status: DC

## 2018-02-10 NOTE — Telephone Encounter (Signed)
Copied from [place] [phone]. Topic: General - Other >> Feb 09, 2018  4:50 PM Marin Olp L wrote: Reason for CRM: Wants to know if one touch verio test strips are available in office as a sample?  Patient was informed that we do not have any samples of test strips, but then asked if she can be seen today for throat irritation.  She was transferred upfront for scheduling and a refill request was submitted for the requested test strips.

## 2018-02-10 NOTE — Progress Notes (Signed)
Name: Valerie Manning   MRN: [id number]    DOB: October 11, 1965   Date:02/10/2018       Progress Note  Subjective  Chief Complaint  Chief Complaint  Patient presents with  . Sore Throat    patient presents with sore throat since Sunday. very painful yesterday. patient has tried some warm salt water gargles. no otc nsaids. left side hurts worse. some voice changes    HPI  URI: she states she felt a scratchy throat 4 days ago, one day later it felt worse with burning sensation, some rhinorrhea and nasal congestion and a dry cough at night. No fever or chills. She has not been taking any otc medication. She denies wheezing or SOB. She has some hoarseness. No ear pain. She is feeling better today than she was 2 days ago.    Patient Active Problem List   Diagnosis Date Noted  . 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 Status post abdominal hysterectomy and left salpingo-oophorectomy 10/25/2015  . Acquired hypothyroidism 03/13/2015  . History of iron deficiency anemia 03/13/2015  . Hypertension 08/15/2014  . Dyslipidemia associated with type 2 diabetes mellitus ([place]) 05/31/2014  . GERD (gastroesophageal reflux disease) 05/31/2014  . Hyperlipidemia 05/31/2014    Past Surgical History:  Procedure Laterality Date  . ABDOMINAL HYSTERECTOMY    . 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 BREAST BIOPSY Right 09/13/2016   Procedure: BREAST BIOPSY WITH NEEDLE LOCALIZATION;  Surgeon: [name], MD;  Location: [facility] ORS;  Service: General;  Laterality: Right;  . BREAST EXCISIONAL BIOPSY Right 06/16/2016   lumpectomy complext sclerosing lesion, benign    Family History  Problem Relation Age of Onset  . Diabetes Mother   . Diabetes Sister   . Heart disease Sister   . Diabetes Sister   . Diabetes Sister   . Thyroid disease Sister   . Diabetes Sister   . Thyroid disease Sister   . Thyroid disease Sister   . Heart disease Father   .  Cancer Neg Hx   . Breast cancer Neg Hx     Social History   Socioeconomic History  . Marital status: Married    Spouse name: [name]  . Number of children: 2  . Years of education: Not on file  . Highest education level: High school graduate  Occupational History  . Not on file  Social Needs  . Financial resource strain: Not very hard  . Food insecurity:    Worry: Never true    Inability: Never true  . Transportation needs:    Medical: No    Non-medical: No  Tobacco Use  . Smoking status: Never Smoker  . Smokeless tobacco: Never Used  Substance and Sexual Activity  . Alcohol use: No    Alcohol/week: 0.0 standard drinks  . Drug use: No  . Sexual activity: Yes    Partners: Male    Birth control/protection: Surgical  Lifestyle  . Physical activity:    Days per week: 0 days    Minutes per session: 0 min  . Stress: To some extent  Relationships  . Social connections:    Talks on phone: More than three times a week    Gets together: More than three times a week    Attends religious service: More than 4 times per year    Active member of club or organization: No    Attends meetings of clubs or organizations: Never  Relationship status: Married  . Intimate partner violence:    Fear of current or ex partner: No    Emotionally abused: No    Physically abused: No    Forced sexual activity: No  Other Topics Concern  . Not on file  Social History Narrative   Patient has a combined 3 children but she only birthed 2.     Current Outpatient Medications:  .  aspirin EC 81 MG tablet, Take 81 mg by mouth daily. , Disp: , Rfl:  .  atorvastatin (LIPITOR) 20 MG tablet, Take 1 tablet (20 mg total) by mouth daily., Disp: 90 tablet, Rfl: 1 .  estradiol (ESTRACE) 0.5 MG tablet, Take 1 tablet (0.5 mg total) by mouth daily., Disp: 30 tablet, Rfl: 6 .  glipiZIDE (GLUCOTROL XL) 5 MG 24 hr tablet, Take 1 tablet (5 mg total) by mouth daily with breakfast., Disp: 90 tablet, Rfl: 0 .   levothyroxine (SYNTHROID, LEVOTHROID) 150 MCG tablet, TAKE 1 TABLET ONCE A DAY ORALLY IN THE MORNING, Disp: 90 tablet, Rfl: 0 .  metFORMIN (GLUCOPHAGE-XR) 750 MG 24 hr tablet, TAKE 2 TABLETS(1500 MG) BY MOUTH DAILY WITH BREAKFAST, Disp: 180 tablet, Rfl: 0 .  Multiple Vitamin (MULTIVITAMIN) capsule, Take 1 capsule by mouth daily., Disp: , Rfl:  .  omeprazole (PRILOSEC) 20 MG capsule, TAKE 1 CAPSULE(20 MG) BY MOUTH DAILY, Disp: 30 capsule, Rfl: 0 .  ranitidine (ZANTAC) 150 MG tablet, Take 1 tablet (150 mg total) by mouth 2 (two) times daily as needed for heartburn., Disp: 180 tablet, Rfl: 0 .  telmisartan-hydrochlorothiazide (MICARDIS HCT) 80-25 MG tablet, Take 1 tablet by mouth daily., Disp: 90 tablet, Rfl: 1  No Known Allergies  I personally reviewed active problem list, medication list, allergies, family history, social history with the patient/caregiver today.   ROS  Ten systems reviewed and is negative except as mentioned in HPI   Objective  Vitals:   02/10/18 1207  BP: 128/88  Pulse: (!) 104  Resp: 16  Temp: 98.4 F (36.9 C)  TempSrc: Oral  SpO2: 98%  Weight: 173 lb 14.4 oz (78.9 kg)  Height: 5\' 1"  (1.549 m)    Body mass index is 32.86 kg/m.  Physical Exam  Constitutional: Patient appears well-developed and well-nourished. ObeseNo distress.  HEENT: head atraumatic, normocephalic, pupils equal and reactive to light, ears normal TM, neck supple, throat showed mild erythema but no exsudate  Cardiovascular: Normal rate, regular rhythm and normal heart sounds.  No murmur heard. No BLE edema. Pulmonary/Chest: Effort normal and breath sounds normal. No respiratory distress. Abdominal: Soft.  There is no tenderness. Psychiatric: Patient has a normal mood and affect. behavior is normal. Judgment and thought content normal.   Recent Results (from the past 2160 hour(s))  Fecal occult blood, imunochemical     Status: None   Collection Time: 11/13/17 12:00 AM  Result Value Ref  Range   Fecal Occult Bld Negative Negative     PHQ2/9: Depression screen [facility] 2/9 02/10/2018 10/14/2017 01/27/2017 10/21/2016 09/11/2016  Decreased Interest 0 0 0 0 0  Down, Depressed, Hopeless 0 0 0 0 0  PHQ - 2 Score 0 0 0 0 0  Altered sleeping 0 - - - -  Tired, decreased energy 0 - - - -  Change in appetite 0 - - - -  Feeling bad or failure about yourself  0 - - - -  Trouble concentrating 0 - - - -  Moving slowly or fidgety/restless 0 - - - -  Suicidal thoughts 0 - - - -  PHQ-9 Score 0 - - - -     Fall Risk: Fall Risk  02/10/2018 10/14/2017 07/11/2017 01/27/2017 10/21/2016  Falls in the past year? No No No Yes No  Number falls in past yr: - - - 1 -  Injury with Fall? - - - Yes -     Functional Status Survey: Is the patient deaf or have difficulty hearing?: No Does the patient have difficulty seeing, even when wearing glasses/contacts?: No Does the patient have difficulty concentrating, remembering, or making decisions?: No Does the patient have difficulty walking or climbing stairs?: No Does the patient have difficulty dressing or bathing?: No Does the patient have difficulty doing errands alone such as visiting a doctor's office or shopping?: No    Assessment & Plan  1. URI, acute  - Diphenhyd-Hydrocort-Nystatin (FIRST-DUKES MOUTHWASH) SUSP; Use as directed 15 mLs in the mouth or throat 4 (four) times daily -  before meals and at bedtime.  Dispense: 237 mL; Refill: 0 - benzonatate (TESSALON) 100 MG capsule; Take 1-2 capsules (100-200 mg total) by mouth 2 (two) times daily as needed.  Dispense: 40 capsule; Refill: 0

## 2018-02-10 NOTE — Telephone Encounter (Signed)
Refill request was sent to Dr. Krichna Sowles for approval and submission.  

## 2018-02-11 MED ORDER — GLUCOSE BLOOD VI STRP: ORAL_STRIP | 1 refills | Status: DC

## 2018-02-13 ENCOUNTER — Other Ambulatory Visit
Admission: RE | Admit: 2018-02-13 | Discharge: 2018-02-13 | Disposition: A | Discharge status: Home / Self Care | Payer: Federal, State, Local not specified - PPO | Source: Ambulatory Visit | Attending: Family Medicine | Admitting: Family Medicine

## 2018-02-13 ENCOUNTER — Ambulatory Visit: Payer: Federal, State, Local not specified - PPO | Admitting: Family Medicine

## 2018-02-13 ENCOUNTER — Encounter: Payer: Self-pay | Admitting: Family Medicine

## 2018-02-13 VITALS — BP 124/80 | HR 105 | Temp 98.2°F | Resp 16 | Ht 61.0 in | Wt 171.8 lb

## 2018-02-13 DIAGNOSIS — I10 Essential (primary) hypertension: Secondary | ICD-10-CM | POA: Insufficient documentation

## 2018-02-13 DIAGNOSIS — E785 Hyperlipidemia, unspecified: Secondary | ICD-10-CM | POA: Diagnosis not present

## 2018-02-13 DIAGNOSIS — K2100 Gastro-esophageal reflux disease with esophagitis, without bleeding: Secondary | ICD-10-CM

## 2018-02-13 DIAGNOSIS — R05 Cough: Secondary | ICD-10-CM

## 2018-02-13 DIAGNOSIS — E1169 Type 2 diabetes mellitus with other specified complication: Secondary | ICD-10-CM | POA: Diagnosis not present

## 2018-02-13 DIAGNOSIS — H6993 Unspecified Eustachian tube disorder, bilateral: Secondary | ICD-10-CM

## 2018-02-13 DIAGNOSIS — E039 Hypothyroidism, unspecified: Secondary | ICD-10-CM | POA: Diagnosis not present

## 2018-02-13 DIAGNOSIS — R059 Cough, unspecified: Secondary | ICD-10-CM

## 2018-02-13 DIAGNOSIS — H6983 Other specified disorders of Eustachian tube, bilateral: Secondary | ICD-10-CM

## 2018-02-13 DIAGNOSIS — Z862 Personal history of diseases of the blood and blood-forming organs and certain disorders involving the immune mechanism: Secondary | ICD-10-CM

## 2018-02-13 LAB — COMPREHENSIVE METABOLIC PANEL
ALBUMIN: 4.4 g/dL (ref 3.5–5.0)
ALK PHOS: 113 U/L (ref 38–126)
ALT: 34 U/L (ref 0–44)
ANION GAP: 9 (ref 5–15)
AST: 23 U/L (ref 15–41)
BUN: 11 mg/dL (ref 6–20)
CO2: 30 mmol/L (ref 22–32)
Calcium: 9.8 mg/dL (ref 8.9–10.3)
Chloride: 101 mmol/L (ref 98–111)
Creatinine, Ser: 0.61 mg/dL (ref 0.44–1.00)
GFR calc non Af Amer: >60 mL/min (ref >60)
GLUCOSE: 121 mg/dL — AB (ref 70–99)
Potassium: 4 mmol/L (ref 3.5–5.1)
Sodium: 140 mmol/L (ref 135–145)
TOTAL PROTEIN: 8.1 g/dL (ref 6.5–8.1)
Total Bilirubin: 0.7 mg/dL (ref 0.3–1.2)

## 2018-02-13 LAB — LIPID PANEL
CHOL/HDL RATIO: 2.3 ratio
CHOLESTEROL: 146 mg/dL (ref 0–200)
HDL: 63 mg/dL (ref >40)
LDL Cholesterol: 67 mg/dL (ref 0–99)
TRIGLYCERIDES: 82 mg/dL (ref <150)
VLDL: 16 mg/dL (ref 0–40)

## 2018-02-13 LAB — CBC WITH DIFFERENTIAL/PLATELET
Abs Immature Granulocytes: 0.01 10*3/uL (ref 0.00–0.07)
BASOS PCT: 1 %
Basophils Absolute: 0 10*3/uL (ref 0.0–0.1)
EOS ABS: 0.1 10*3/uL (ref 0.0–0.5)
Eosinophils Relative: 3 %
HCT: 46.3 % — ABNORMAL HIGH (ref 36.0–46.0)
HEMOGLOBIN: 14.7 g/dL (ref 12.0–15.0)
Immature Granulocytes: 0 %
Lymphocytes Relative: 35 %
Lymphs Abs: 1.5 10*3/uL (ref 0.7–4.0)
MCH: 25.5 pg — AB (ref 26.0–34.0)
MCHC: 31.7 g/dL (ref 30.0–36.0)
MCV: 80.4 fL (ref 80.0–100.0)
MONOS PCT: 7 %
Monocytes Absolute: 0.3 10*3/uL (ref 0.1–1.0)
NEUTROS PCT: 54 %
Neutro Abs: 2.3 10*3/uL (ref 1.7–7.7)
Platelets: 261 10*3/uL (ref 150–400)
RBC: 5.76 MIL/uL — AB (ref 3.87–5.11)
RDW: 14.5 % (ref 11.5–15.5)
WBC: 4.3 10*3/uL (ref 4.0–10.5)
nRBC: 0 % (ref 0.0–0.2)

## 2018-02-13 LAB — TSH: TSH: 1.301 u[IU]/mL (ref 0.350–4.500)

## 2018-02-13 MED ORDER — RANITIDINE HCL 150 MG PO TABS: 150.0000 mg | ORAL_TABLET | Freq: Every day | ORAL | 1 refills | Status: DC | PRN

## 2018-02-13 MED ORDER — TELMISARTAN-HCTZ 80-25 MG PO TABS: 1.0000 | ORAL_TABLET | Freq: Every day | ORAL | 1 refills | Status: DC

## 2018-02-13 MED ORDER — FLUTICASONE PROPIONATE 50 MCG/ACT NA SUSP: 2.0000 | Freq: Every day | NASAL | 0 refills | Status: DC

## 2018-02-13 MED ORDER — ATORVASTATIN CALCIUM 20 MG PO TABS: 20.0000 mg | ORAL_TABLET | Freq: Every day | ORAL | 1 refills | Status: DC

## 2018-02-13 MED ORDER — GLIPIZIDE ER 5 MG PO TB24: 5.0000 mg | ORAL_TABLET | Freq: Every day | ORAL | 1 refills | Status: DC

## 2018-02-13 MED ORDER — HYDROCOD POLST-CPM POLST ER 10-8 MG/5ML PO SUER: 5.0000 mL | Freq: Two times a day (BID) | ORAL | 0 refills | Status: DC | PRN

## 2018-02-13 MED ORDER — GLUCOSE BLOOD VI STRP: ORAL_STRIP | 12 refills | Status: AC

## 2018-02-13 NOTE — Addendum Note (Signed)
Addended by: Saunders Glance A on: 02/13/2018 03:01 PM   Modules accepted: Orders

## 2018-02-13 NOTE — Progress Notes (Signed)
Name: Valerie Manning   MRN: [id number]    DOB: 31-Aug-1965   Date:02/13/2018       Progress Note  Subjective  Chief Complaint  Chief Complaint  Patient presents with  . Follow-up    patient is here for 3 mth f/u  . Medication Refill  . Hypertension  . Hyperlipidemia  . Diabetes  . Hypothyroidism  . Gastroesophageal Reflux  . Cough    HPI    HTN: bp is at goal today and last visit, she states avoiding salt. No chest pain or palpitation   History of iron deficiency anemia: not on supplementation but sates improved after hysterectomy years ago.  We will recheck labs, hemoccult stools negative, denies fatigue today   Hypothyroidism: history of goiter treated with iodine, she used to see  Dr. Rosario Jacks, taking Synthroid 150 mcg daily. No dry skin, change in bowel movements or weight. No dysphagia, we will recheck TSH today .    DMII: out of strips so has not been able to check her glucose.  She is taking glipizide XL 5 mg in ams, and Metformin 1500 mg daily for the past 3 months. Last hgbA1C was 6 %, explained glucose may be dropping during the night. HgbA1C down to 6.0%. She denies polyphagia, polydipsia or polyuria. Eye exam is up to date, also foot exam  Hyperlipidemia: taking atorvastatin, denies myalgia, we will recheck labs   Cough: seen two days ago with URI symptoms, states a little better, but would like something to help with her cough She also noticed ear feeling full intermittently. .   Patient Active Problem List   Diagnosis Date Noted  . 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 Status post abdominal hysterectomy and left salpingo-oophorectomy 10/25/2015  . Acquired hypothyroidism 03/13/2015  . History of iron deficiency anemia 03/13/2015  . Hypertension 08/15/2014  . Dyslipidemia associated with type 2 diabetes mellitus ([place]) 05/31/2014  . GERD (gastroesophageal reflux disease) 05/31/2014  . Hyperlipidemia 05/31/2014    Past Surgical History:  Procedure Laterality Date  .  ABDOMINAL HYSTERECTOMY    . 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 BREAST BIOPSY Right 09/13/2016   Procedure: BREAST BIOPSY WITH NEEDLE LOCALIZATION;  Surgeon: [name], MD;  Location: [facility] ORS;  Service: General;  Laterality: Right;  . BREAST EXCISIONAL BIOPSY Right 06/16/2016   lumpectomy complext sclerosing lesion, benign    Family History  Problem Relation Age of Onset  . Diabetes Mother   . Diabetes Sister   . Heart disease Sister   . Diabetes Sister   . Diabetes Sister   . Thyroid disease Sister   . Diabetes Sister   . Thyroid disease Sister   . Thyroid disease Sister   . Heart disease Father   . Cancer Neg Hx   . Breast cancer Neg Hx     Social History   Socioeconomic History  . Marital status: Married    Spouse name: [name]  . Number of children: 2  . Years of education: Not on file  . Highest education level: High school graduate  Occupational History  . Not on file  Social Needs  . Financial resource strain: Not very hard  . Food insecurity:    Worry: Never true    Inability: Never true  . Transportation needs:    Medical: No    Non-medical: No  Tobacco Use  . Smoking status: Never Smoker  . Smokeless tobacco:  Never Used  Substance and Sexual Activity  . Alcohol use: No    Alcohol/week: 0.0 standard drinks  . Drug use: No  . Sexual activity: Yes    Partners: Male    Birth control/protection: Surgical  Lifestyle  . Physical activity:    Days per week: 0 days    Minutes per session: 0 min  . Stress: To some extent  Relationships  . Social connections:    Talks on phone: More than three times a week    Gets together: More than three times a week    Attends religious service: More than 4 times per year    Active member of club or organization: No    Attends meetings of clubs or organizations: Never    Relationship status: Married  . Intimate partner violence:    Fear  of current or ex partner: No    Emotionally abused: No    Physically abused: No    Forced sexual activity: No  Other Topics Concern  . Not on file  Social History Narrative   Patient has a combined 3 children but she only birthed 2.     Current Outpatient Medications:  .  aspirin EC 81 MG tablet, Take 81 mg by mouth daily. , Disp: , Rfl:  .  atorvastatin (LIPITOR) 20 MG tablet, Take 1 tablet (20 mg total) by mouth daily., Disp: 90 tablet, Rfl: 1 .  benzonatate (TESSALON) 100 MG capsule, Take 1-2 capsules (100-200 mg total) by mouth 2 (two) times daily as needed., Disp: 40 capsule, Rfl: 0 .  chlorpheniramine-HYDROcodone (TUSSIONEX PENNKINETIC ER) 10-8 MG/5ML SUER, Take 5 mLs by mouth every 12 (twelve) hours as needed., Disp: 140 mL, Rfl: 0 .  Diphenhyd-Hydrocort-Nystatin (FIRST-DUKES MOUTHWASH) SUSP, Use as directed 15 mLs in the mouth or throat 4 (four) times daily -  before meals and at bedtime., Disp: 237 mL, Rfl: 0 .  estradiol (ESTRACE) 0.5 MG tablet, Take 1 tablet (0.5 mg total) by mouth daily., Disp: 30 tablet, Rfl: 6 .  glipiZIDE (GLUCOTROL XL) 5 MG 24 hr tablet, Take 1 tablet (5 mg total) by mouth daily with breakfast., Disp: 90 tablet, Rfl: 0 .  glucose blood (ONE TOUCH ULTRA TEST) test strip, Use as instructed, Disp: 100 each, Rfl: 1 .  levothyroxine (SYNTHROID, LEVOTHROID) 150 MCG tablet, TAKE 1 TABLET ONCE A DAY ORALLY IN THE MORNING, Disp: 90 tablet, Rfl: 0 .  metFORMIN (GLUCOPHAGE-XR) 750 MG 24 hr tablet, TAKE 2 TABLETS(1500 MG) BY MOUTH DAILY WITH BREAKFAST, Disp: 180 tablet, Rfl: 0 .  Multiple Vitamin (MULTIVITAMIN) capsule, Take 1 capsule by mouth daily., Disp: , Rfl:  .  omeprazole (PRILOSEC) 20 MG capsule, TAKE 1 CAPSULE(20 MG) BY MOUTH DAILY, Disp: 30 capsule, Rfl: 0 .  ranitidine (ZANTAC) 150 MG tablet, Take 1 tablet (150 mg total) by mouth 2 (two) times daily as needed for heartburn., Disp: 180 tablet, Rfl: 0 .  telmisartan-hydrochlorothiazide (MICARDIS HCT) 80-25 MG  tablet, Take 1 tablet by mouth daily., Disp: 90 tablet, Rfl: 1  No Known Allergies  I personally reviewed active problem list, medication list, allergies, family history, social history with the patient/caregiver today.   ROS  Constitutional: Negative for fever or weight change.  Respiratory: Negative for cough and shortness of breath.   Cardiovascular: Negative for chest pain or palpitations.  Gastrointestinal: Negative for abdominal pain, no bowel changes.  Musculoskeletal: Negative for gait problem or joint swelling.  Skin: Negative for rash.  Neurological: Negative for dizziness or  headache.  No other specific complaints in a complete review of systems (except as listed in HPI above).  Objective  Vitals:   02/13/18 1358  BP: 124/80  Pulse: (!) 105  Resp: 16  Temp: 98.2 F (36.8 C)  TempSrc: Oral  SpO2: 98%  Weight: 171 lb 12.8 oz (77.9 kg)  Height: 5\' 1"  (1.549 m)    Body mass index is 32.46 kg/m.  Physical Exam  Constitutional: Patient appears well-developed and well-nourished. Obese  No distress.  HEENT: head atraumatic, normocephalic, pupils equal and reactive to light, ears TM normal bilaterally,  neck supple, throat within normal limits Cardiovascular: Normal rate, regular rhythm and normal heart sounds.  No murmur heard. No BLE edema. Pulmonary/Chest: Effort normal and breath sounds normal. No respiratory distress. Abdominal: Soft.  There is no tenderness. Psychiatric: Patient has a normal mood and affect. behavior is normal. Judgment and thought content normal.  PHQ2/9: Depression screen [facility] 2/9 02/13/2018 02/10/2018 10/14/2017 01/27/2017 10/21/2016  Decreased Interest 0 0 0 0 0  Down, Depressed, Hopeless 0 0 0 0 0  PHQ - 2 Score 0 0 0 0 0  Altered sleeping 0 0 - - -  Tired, decreased energy 0 0 - - -  Change in appetite 0 0 - - -  Feeling bad or failure about yourself  0 0 - - -  Trouble concentrating 0 0 - - -  Moving slowly or fidgety/restless 0 0 - -  -  Suicidal thoughts 0 0 - - -  PHQ-9 Score 0 0 - - -  Difficult doing work/chores Not difficult at all - - - -     Fall Risk: Fall Risk  02/10/2018 10/14/2017 07/11/2017 01/27/2017 10/21/2016  Falls in the past year? No No No Yes No  Number falls in past yr: - - - 1 -  Injury with Fall? - - - Yes -    Functional Status Survey: Is the patient deaf or have difficulty hearing?: No Does the patient have difficulty seeing, even when wearing glasses/contacts?: No Does the patient have difficulty concentrating, remembering, or making decisions?: No Does the patient have difficulty walking or climbing stairs?: No Does the patient have difficulty dressing or bathing?: No Does the patient have difficulty doing errands alone such as visiting a doctor's office or shopping?: No   Assessment & Plan  1. Dyslipidemia associated with type 2 diabetes mellitus ([facility])  - Hemoglobin A1c - Lipid panel - glipiZIDE (GLUCOTROL XL) 5 MG 24 hr tablet; Take 1 tablet (5 mg total) by mouth daily with breakfast.  Dispense: 90 tablet; Refill: 1 - atorvastatin (LIPITOR) 20 MG tablet; Take 1 tablet (20 mg total) by mouth daily.  Dispense: 90 tablet; Refill: 1 - glucose blood (ONETOUCH VERIO) test strip; Use as instructed  Dispense: 100 each; Refill: 12  2. Hypertension, benign  - COMPLETE METABOLIC PANEL WITH GFR - telmisartan-hydrochlorothiazide (MICARDIS HCT) 80-25 MG tablet; Take 1 tablet by mouth daily.  Dispense: 90 tablet; Refill: 1  3. History of iron deficiency anemia  - CBC with Differential/Platelet  4. Reflux esophagitis  - ranitidine (ZANTAC) 150 MG tablet; Take 1 tablet (150 mg total) by mouth daily as needed for heartburn.  Dispense: 90 tablet; Refill: 1  5. Acquired hypothyroidism  - TSH  6. Cough  - chlorpheniramine-HYDROcodone (TUSSIONEX PENNKINETIC ER) 10-8 MG/5ML SUER; Take 5 mLs by mouth every 12 (twelve) hours as needed.  Dispense: 140 mL; Refill: 0  7. Hyperlipidemia,  unspecified hyperlipidemia type  Continue  statin therapy   8. Eustachian tube dysfunction, bilateral  - fluticasone (FLONASE) 50 MCG/ACT nasal spray; Place 2 sprays into both nostrils daily.  Dispense: 16 g; Refill: 0

## 2018-02-15 ENCOUNTER — Other Ambulatory Visit: Payer: Self-pay | Admitting: Family Medicine

## 2018-02-15 LAB — HEMOGLOBIN A1C
HEMOGLOBIN A1C: 6.6 % — AB (ref 4.8–5.6)
MEAN PLASMA GLUCOSE: 143 mg/dL

## 2018-02-16 ENCOUNTER — Encounter: Payer: Self-pay | Admitting: Family Medicine

## 2018-02-18 ENCOUNTER — Encounter: Payer: Self-pay | Admitting: Family Medicine

## 2018-02-18 DIAGNOSIS — R718 Other abnormality of red blood cells: Secondary | ICD-10-CM | POA: Insufficient documentation

## 2018-03-03 ENCOUNTER — Encounter: Payer: Self-pay | Admitting: Family Medicine

## 2018-04-12 ENCOUNTER — Other Ambulatory Visit: Payer: Self-pay | Admitting: Family Medicine

## 2018-04-12 DIAGNOSIS — E1169 Type 2 diabetes mellitus with other specified complication: Secondary | ICD-10-CM

## 2018-04-12 DIAGNOSIS — E785 Hyperlipidemia, unspecified: Principal | ICD-10-CM

## 2018-05-18 ENCOUNTER — Other Ambulatory Visit: Payer: Self-pay | Admitting: Family Medicine

## 2018-05-18 NOTE — Telephone Encounter (Signed)
Refill request for thyroid medication. Levothyroxine   Last visit: 02/13/2018   Lab Results  Component Value Date   TSH 1.301 02/13/2018    Follow up on 06/16/2018

## 2018-05-21 ENCOUNTER — Other Ambulatory Visit: Payer: Self-pay

## 2018-05-21 MED ORDER — FAMOTIDINE 40 MG PO TABS: 40.0000 mg | ORAL_TABLET | Freq: Every day | ORAL | 0 refills | Status: DC | PRN

## 2018-05-21 NOTE — Telephone Encounter (Signed)
Ranitidine 150 mg was recalled by pharmacy. Patient states that when she got letter pharmacist states that they no longer had the medication that had the cancer causing agent in it. She still did not want to be on this medication period. She would like to try the pepcid. Please review and sign rx.

## 2018-06-16 ENCOUNTER — Ambulatory Visit: Payer: Federal, State, Local not specified - PPO | Admitting: Family Medicine

## 2018-06-16 ENCOUNTER — Encounter: Payer: Self-pay | Admitting: Family Medicine

## 2018-06-16 VITALS — BP 130/90 | HR 80 | Temp 97.6°F | Resp 16 | Ht 61.0 in | Wt 173.2 lb

## 2018-06-16 DIAGNOSIS — Z23 Encounter for immunization: Secondary | ICD-10-CM

## 2018-06-16 DIAGNOSIS — I10 Essential (primary) hypertension: Secondary | ICD-10-CM

## 2018-06-16 DIAGNOSIS — E1169 Type 2 diabetes mellitus with other specified complication: Secondary | ICD-10-CM | POA: Diagnosis not present

## 2018-06-16 DIAGNOSIS — E039 Hypothyroidism, unspecified: Secondary | ICD-10-CM

## 2018-06-16 DIAGNOSIS — M79671 Pain in right foot: Secondary | ICD-10-CM

## 2018-06-16 DIAGNOSIS — K2100 Gastro-esophageal reflux disease with esophagitis, without bleeding: Secondary | ICD-10-CM

## 2018-06-16 DIAGNOSIS — E785 Hyperlipidemia, unspecified: Secondary | ICD-10-CM | POA: Diagnosis not present

## 2018-06-16 LAB — POCT GLYCOSYLATED HEMOGLOBIN (HGB A1C): Hemoglobin A1C: 6 % — AB (ref 4.0–5.6)

## 2018-06-16 MED ORDER — LEVOTHYROXINE SODIUM 150 MCG PO TABS: 150.0000 ug | ORAL_TABLET | Freq: Every day | ORAL | 0 refills | Status: DC

## 2018-06-16 MED ORDER — ATORVASTATIN CALCIUM 20 MG PO TABS: 20.0000 mg | ORAL_TABLET | Freq: Every day | ORAL | 1 refills | Status: DC

## 2018-06-16 MED ORDER — GLIPIZIDE ER 2.5 MG PO TB24: 2.5000 mg | ORAL_TABLET | Freq: Every day | ORAL | 1 refills | Status: DC

## 2018-06-16 MED ORDER — METFORMIN HCL ER 750 MG PO TB24: 1500.0000 mg | ORAL_TABLET | Freq: Every day | ORAL | 1 refills | Status: DC

## 2018-06-16 MED ORDER — TELMISARTAN-HCTZ 80-25 MG PO TABS: 1.0000 | ORAL_TABLET | Freq: Every day | ORAL | 1 refills | Status: DC

## 2018-06-16 NOTE — Progress Notes (Signed)
Name: Valerie Manning   MRN: [id number]    DOB: Jun 23, 1965   Date:06/16/2018       Progress Note  Subjective  Chief Complaint  Chief Complaint  Patient presents with  . Diabetes  . Hyperlipidemia  . Hypertension  . Hypothyroidism    HPI    HTN: bp was  at goal today and last visit, DBP is 90, she states avoiding salt. No chest pain or palpitation.   History of iron deficiency anemia: not on supplementation but sates improved after hysterectomy years ago.  Last HCT was high, she snores but very seldom and denies day time fatigue, she is not a smoker.   Hypothyroidism: history of goiter treated with iodine, sheused to seeDr. Rosario Jacks, taking Synthroid 150 mcg daily. No dry skin, change in bowel movements or weight. No dysphagia, last TSH was at goal, and on same dose for over one year.    DMII:  She is taking glipizide XL 5 mg in ams, and Metformin 1500 mg A1C today is down to 6% again, we will decrease dose of Glipizide to 2.5 mg daily. She denies polyphagia, polydipsia or polyuria. Eye exam is up to date, foot exam was done today and normal. No low sugars. She has been compliant with a diabetic diet  Hyperlipidemia: taking atorvastatin, deniesmyalgia, LDL at goal  Right foot pain: she walks all the time at work, she rotates her tennis shoes, but over the past month she has noticed pain on the forefoot. Pain is described as a soreness, also triggered by flexing toes.   Patient Active Problem List   Diagnosis Date Noted  . Elevated red blood cell count 02/18/2018  . 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 Status post abdominal hysterectomy and left salpingo-oophorectomy 10/25/2015  . Acquired hypothyroidism 03/13/2015  . History of iron deficiency anemia 03/13/2015  . Hypertension 08/15/2014  . Dyslipidemia associated with type 2 diabetes mellitus ([place]) 05/31/2014  . GERD (gastroesophageal reflux disease) 05/31/2014  . Hyperlipidemia 05/31/2014    Past Surgical History:  Procedure  Laterality Date  . ABDOMINAL HYSTERECTOMY    . 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 BREAST BIOPSY Right 09/13/2016   Procedure: BREAST BIOPSY WITH NEEDLE LOCALIZATION;  Surgeon: [name], MD;  Location: [facility] ORS;  Service: General;  Laterality: Right;  . BREAST EXCISIONAL BIOPSY Right 06/16/2016   lumpectomy complext sclerosing lesion, benign    Family History  Problem Relation Age of Onset  . Diabetes Mother   . Diabetes Sister   . Heart disease Sister   . Diabetes Sister   . Diabetes Sister   . Thyroid disease Sister   . Diabetes Sister   . Thyroid disease Sister   . Thyroid disease Sister   . Heart disease Father   . Cancer Neg Hx   . Breast cancer Neg Hx     Social History   Socioeconomic History  . Marital status: Married    Spouse name: [name]  . Number of children: 2  . Years of education: Not on file  . Highest education level: High school graduate  Occupational History  . Not on file  Social Needs  . Financial resource strain: Not very hard  . Food insecurity:    Worry: Never true    Inability: Never true  . Transportation needs:    Medical: No    Non-medical: No  Tobacco Use  . Smoking status: Never Smoker  .  Smokeless tobacco: Never Used  Substance and Sexual Activity  . Alcohol use: No    Alcohol/week: 0.0 standard drinks  . Drug use: No  . Sexual activity: Yes    Partners: Male    Birth control/protection: Surgical  Lifestyle  . Physical activity:    Days per week: 0 days    Minutes per session: 0 min  . Stress: To some extent  Relationships  . Social connections:    Talks on phone: More than three times a week    Gets together: More than three times a week    Attends religious service: More than 4 times per year    Active member of club or organization: No    Attends meetings of clubs or organizations: Never    Relationship status: Married  . Intimate partner  violence:    Fear of current or ex partner: No    Emotionally abused: No    Physically abused: No    Forced sexual activity: No  Other Topics Concern  . Not on file  Social History Narrative   Patient has a combined 3 children but she only birthed 2.     Current Outpatient Medications:  .  aspirin EC 81 MG tablet, Take 81 mg by mouth daily. , Disp: , Rfl:  .  atorvastatin (LIPITOR) 20 MG tablet, Take 1 tablet (20 mg total) by mouth daily., Disp: 90 tablet, Rfl: 1 .  estradiol (ESTRACE) 0.5 MG tablet, Take 1 tablet (0.5 mg total) by mouth daily., Disp: 30 tablet, Rfl: 6 .  famotidine (PEPCID) 40 MG tablet, Take 1 tablet (40 mg total) by mouth daily as needed for heartburn or indigestion., Disp: 90 tablet, Rfl: 0 .  fluticasone (FLONASE) 50 MCG/ACT nasal spray, Place 2 sprays into both nostrils daily., Disp: 16 g, Rfl: 0 .  glipiZIDE (GLUCOTROL XL) 2.5 MG 24 hr tablet, Take 1 tablet (2.5 mg total) by mouth daily with breakfast., Disp: 90 tablet, Rfl: 1 .  glucose blood (ONETOUCH VERIO) test strip, Use as instructed, Disp: 100 each, Rfl: 12 .  levothyroxine (SYNTHROID, LEVOTHROID) 150 MCG tablet, Take 1 tablet (150 mcg total) by mouth daily., Disp: 90 tablet, Rfl: 0 .  metFORMIN (GLUCOPHAGE-XR) 750 MG 24 hr tablet, Take 2 tablets (1,500 mg total) by mouth daily with breakfast., Disp: 180 tablet, Rfl: 1 .  Multiple Vitamin (MULTIVITAMIN) capsule, Take 1 capsule by mouth daily., Disp: , Rfl:  .  telmisartan-hydrochlorothiazide (MICARDIS HCT) 80-25 MG tablet, Take 1 tablet by mouth daily., Disp: 90 tablet, Rfl: 1  No Known Allergies  I personally reviewed active problem list, medication list, family history, social history, health maintenance with the patient/caregiver today.   ROS  Constitutional: Negative for fever or weight change.  Respiratory: Negative for cough and shortness of breath.   Cardiovascular: Negative for chest pain or palpitations.  Gastrointestinal: Negative for  abdominal pain, no bowel changes.  Musculoskeletal: Negative for gait problem or joint swelling.  Skin: Negative for rash.  Neurological: Negative for dizziness or headache.  No other specific complaints in a complete review of systems (except as listed in HPI above).  Objective  Vitals:   06/16/18 0830  BP: 130/90  Pulse: 80  Resp: 16  Temp: 97.6 F (36.4 C)  TempSrc: Oral  SpO2: 99%  Weight: 173 lb 3.2 oz (78.6 kg)  Height: 5\' 1"  (1.549 m)    Body mass index is 32.73 kg/m.  Physical Exam  Constitutional: Patient appears well-developed and well-nourished. Obese  No distress.  HEENT: head atraumatic, normocephalic, pupils equal and reactive to light,  neck supple, throat within normal limits Cardiovascular: Normal rate, regular rhythm and normal heart sounds.  No murmur heard. No BLE edema. Pulmonary/Chest: Effort normal and breath sounds normal. No respiratory distress. Abdominal: Soft.  There is no tenderness. Psychiatric: Patient has a normal mood and affect. behavior is normal. Judgment and thought content normal.  Recent Results (from the past 2160 hour(s))  POCT HgB A1C     Status: Abnormal   Collection Time: 06/16/18  8:38 AM  Result Value Ref Range   Hemoglobin A1C 6.0 (A) 4.0 - 5.6 %   HbA1c POC (<> result, manual entry)     HbA1c, POC (prediabetic range)     HbA1c, POC (controlled diabetic range)      Diabetic Foot Exam: Diabetic Foot Exam - Simple   Simple Foot Form Diabetic Foot exam was performed with the following findings:  Yes 06/16/2018  9:00 AM  Visual Inspection No deformities, no ulcerations, no other skin breakdown bilaterally:  Yes Sensation Testing Intact to touch and monofilament testing bilaterally:  Yes Pulse Check Posterior Tibialis and Dorsalis pulse intact bilaterally:  Yes Comments      PHQ2/9: Depression screen [facility] 2/9 06/16/2018 02/13/2018 02/10/2018 10/14/2017 01/27/2017  Decreased Interest 0 0 0 0 0  Down, Depressed, Hopeless 0  0 0 0 0  PHQ - 2 Score 0 0 0 0 0  Altered sleeping - 0 0 - -  Tired, decreased energy - 0 0 - -  Change in appetite - 0 0 - -  Feeling bad or failure about yourself  - 0 0 - -  Trouble concentrating - 0 0 - -  Moving slowly or fidgety/restless - 0 0 - -  Suicidal thoughts - 0 0 - -  PHQ-9 Score - 0 0 - -  Difficult doing work/chores - Not difficult at all - - -     Fall Risk: Fall Risk  06/16/2018 02/10/2018 10/14/2017 07/11/2017 01/27/2017  Falls in the past year? 0 No No No Yes  Number falls in past yr: 0 - - - 1  Injury with Fall? 0 - - - Yes     Assessment & Plan  1. Dyslipidemia associated with type 2 diabetes mellitus (HCC)  - POCT HgB A1C - metFORMIN (GLUCOPHAGE-XR) 750 MG 24 hr tablet; Take 2 tablets (1,500 mg total) by mouth daily with breakfast.  Dispense: 180 tablet; Refill: 1 - glipiZIDE (GLUCOTROL XL) 2.5 MG 24 hr tablet; Take 1 tablet (2.5 mg total) by mouth daily with breakfast.  Dispense: 90 tablet; Refill: 1 - atorvastatin (LIPITOR) 20 MG tablet; Take 1 tablet (20 mg total) by mouth daily.  Dispense: 90 tablet; Refill: 1  2. Need for Tdap vaccination  Had it at work   3. Right foot pain  - Ambulatory referral to Podiatry  4. Hypertension, benign  - telmisartan-hydrochlorothiazide (MICARDIS HCT) 80-25 MG tablet; Take 1 tablet by mouth daily.  Dispense: 90 tablet; Refill: 1  5. Reflux esophagitis  Doing well on prn medication   6. Acquired hypothyroidism  - levothyroxine (SYNTHROID, LEVOTHROID) 150 MCG tablet; Take 1 tablet (150 mcg total) by mouth daily.  Dispense: 90 tablet; Refill: 0  7. Dyslipidemia  - atorvastatin (LIPITOR) 20 MG tablet; Take 1 tablet (20 mg total) by mouth daily.  Dispense: 90 tablet; Refill: 1

## 2018-06-25 ENCOUNTER — Other Ambulatory Visit: Payer: Self-pay | Admitting: Family Medicine

## 2018-06-25 DIAGNOSIS — Z1231 Encounter for screening mammogram for malignant neoplasm of breast: Secondary | ICD-10-CM

## 2018-07-08 DIAGNOSIS — M7751 Other enthesopathy of right foot: Secondary | ICD-10-CM | POA: Diagnosis not present

## 2018-07-08 DIAGNOSIS — Q66211 Congenital metatarsus primus varus, right foot: Secondary | ICD-10-CM | POA: Diagnosis not present

## 2018-07-08 DIAGNOSIS — M79671 Pain in right foot: Secondary | ICD-10-CM | POA: Diagnosis not present

## 2018-07-09 ENCOUNTER — Other Ambulatory Visit: Payer: Self-pay | Admitting: Obstetrics and Gynecology

## 2018-07-10 ENCOUNTER — Other Ambulatory Visit: Payer: Self-pay

## 2018-07-10 MED ORDER — ESTRADIOL 0.5 MG PO TABS: 0.5000 mg | ORAL_TABLET | Freq: Every day | ORAL | 6 refills | Status: DC

## 2018-08-17 DIAGNOSIS — M7751 Other enthesopathy of right foot: Secondary | ICD-10-CM | POA: Diagnosis not present

## 2018-08-17 DIAGNOSIS — M79671 Pain in right foot: Secondary | ICD-10-CM | POA: Diagnosis not present

## 2018-08-17 DIAGNOSIS — M216X1 Other acquired deformities of right foot: Secondary | ICD-10-CM | POA: Diagnosis not present

## 2018-08-17 DIAGNOSIS — Q66211 Congenital metatarsus primus varus, right foot: Secondary | ICD-10-CM | POA: Diagnosis not present

## 2018-10-15 ENCOUNTER — Other Ambulatory Visit: Payer: Self-pay

## 2018-10-15 ENCOUNTER — Ambulatory Visit
Admission: RE | Admit: 2018-10-15 | Discharge: 2018-10-15 | Disposition: A | Discharge status: Home / Self Care | Payer: Federal, State, Local not specified - PPO | Source: Ambulatory Visit | Attending: Family Medicine | Admitting: Family Medicine

## 2018-10-15 DIAGNOSIS — Z1231 Encounter for screening mammogram for malignant neoplasm of breast: Secondary | ICD-10-CM | POA: Insufficient documentation

## 2018-10-16 ENCOUNTER — Other Ambulatory Visit: Payer: Self-pay | Admitting: Family Medicine

## 2018-10-16 DIAGNOSIS — R928 Other abnormal and inconclusive findings on diagnostic imaging of breast: Secondary | ICD-10-CM

## 2018-10-16 DIAGNOSIS — N6489 Other specified disorders of breast: Secondary | ICD-10-CM

## 2018-10-23 ENCOUNTER — Ambulatory Visit: Payer: Federal, State, Local not specified - PPO | Admitting: Family Medicine

## 2018-10-26 ENCOUNTER — Ambulatory Visit
Admission: RE | Admit: 2018-10-26 | Discharge: 2018-10-26 | Disposition: A | Discharge status: Home / Self Care | Payer: Federal, State, Local not specified - PPO | Source: Ambulatory Visit | Attending: Family Medicine | Admitting: Family Medicine

## 2018-10-26 ENCOUNTER — Other Ambulatory Visit: Payer: Self-pay

## 2018-10-26 DIAGNOSIS — R928 Other abnormal and inconclusive findings on diagnostic imaging of breast: Secondary | ICD-10-CM | POA: Insufficient documentation

## 2018-10-26 DIAGNOSIS — N6489 Other specified disorders of breast: Secondary | ICD-10-CM

## 2018-11-09 ENCOUNTER — Telehealth: Payer: Self-pay | Admitting: Family Medicine

## 2018-11-09 ENCOUNTER — Other Ambulatory Visit: Payer: Self-pay | Admitting: Family Medicine

## 2018-11-09 ENCOUNTER — Other Ambulatory Visit: Payer: Self-pay

## 2018-11-09 DIAGNOSIS — Z20822 Contact with and (suspected) exposure to covid-19: Secondary | ICD-10-CM

## 2018-11-09 DIAGNOSIS — Z20828 Contact with and (suspected) exposure to other viral communicable diseases: Secondary | ICD-10-CM

## 2018-11-09 NOTE — Telephone Encounter (Signed)
Patients daughter tested positive Covid and pt needs a order for Covid test before going back to work. No symptoms at this time. Call [phone]. Pt can not return to work until she has her results.

## 2018-11-09 NOTE — Progress Notes (Signed)
Exposed to COVID-19 ( daughter ) needs negative test prior to going back to work

## 2018-11-09 NOTE — Telephone Encounter (Signed)
Contacted pt per Dr. Ancil Boozer request. Pt has been scheduled for today  At the [facility] test site at 11:15am. Pt is aware to remain in her vechile and to wear a mask. Pt understood and had no additional questions at this time. Nothing further is needed. Order has been placed

## 2018-11-09 NOTE — Progress Notes (Signed)
Please see telephone encounter created today. Pt was contacted.

## 2018-11-11 ENCOUNTER — Ambulatory Visit (INDEPENDENT_AMBULATORY_CARE_PROVIDER_SITE_OTHER): Payer: Federal, State, Local not specified - PPO | Admitting: Family Medicine

## 2018-11-11 ENCOUNTER — Other Ambulatory Visit: Payer: Self-pay

## 2018-11-11 ENCOUNTER — Encounter: Payer: Self-pay | Admitting: Family Medicine

## 2018-11-11 DIAGNOSIS — I10 Essential (primary) hypertension: Secondary | ICD-10-CM

## 2018-11-11 DIAGNOSIS — E1169 Type 2 diabetes mellitus with other specified complication: Secondary | ICD-10-CM | POA: Diagnosis not present

## 2018-11-11 DIAGNOSIS — E785 Hyperlipidemia, unspecified: Secondary | ICD-10-CM | POA: Diagnosis not present

## 2018-11-11 DIAGNOSIS — Z20822 Contact with and (suspected) exposure to covid-19: Secondary | ICD-10-CM

## 2018-11-11 DIAGNOSIS — Z20828 Contact with and (suspected) exposure to other viral communicable diseases: Secondary | ICD-10-CM

## 2018-11-11 DIAGNOSIS — E039 Hypothyroidism, unspecified: Secondary | ICD-10-CM | POA: Diagnosis not present

## 2018-11-11 MED ORDER — METFORMIN HCL ER 750 MG PO TB24: 1500.0000 mg | ORAL_TABLET | Freq: Every day | ORAL | 1 refills | Status: DC

## 2018-11-11 MED ORDER — LEVOTHYROXINE SODIUM 150 MCG PO TABS: 150.0000 ug | ORAL_TABLET | Freq: Every day | ORAL | 1 refills | Status: DC

## 2018-11-11 MED ORDER — GLIPIZIDE ER 2.5 MG PO TB24: 2.5000 mg | ORAL_TABLET | Freq: Every day | ORAL | 0 refills | Status: DC

## 2018-11-11 MED ORDER — FAMOTIDINE 40 MG PO TABS: 40.0000 mg | ORAL_TABLET | Freq: Every day | ORAL | 1 refills | Status: DC | PRN

## 2018-11-11 MED ORDER — ATORVASTATIN CALCIUM 20 MG PO TABS: 20.0000 mg | ORAL_TABLET | Freq: Every day | ORAL | 1 refills | Status: DC

## 2018-11-11 MED ORDER — TELMISARTAN-HCTZ 80-25 MG PO TABS: 1.0000 | ORAL_TABLET | Freq: Every day | ORAL | 1 refills | Status: DC

## 2018-11-11 NOTE — Progress Notes (Signed)
Name: Valerie Manning   MRN: [id number]    DOB: 16-May-1965   Date:11/11/2018       Progress Note  Subjective  Chief Complaint  Chief Complaint  Patient presents with  . Diabetes  . Hypertension  . Hyperlipidemia  . Hypothyroidism    I connected with  Shanna Un Schicker on 11/11/18 at 10:40 AM EDT by telephone and verified that I am speaking with the correct person using two identifiers.  I discussed the limitations, risks, security and privacy concerns of performing an evaluation and management service by telephone and the availability of in person appointments. Staff also discussed with the patient that there may be a patient responsible charge related to this service. Patient Location: at home Provider Location: [facility]   HPI  HTN:bp not checked at home, advised her to stop by for VS. She states avoiding salt. No chest pain or palpitation.  Exposed to COVID-19 : daughter had positive last week, feeling better, patient tested on Monday and results pending, advised to remain in quarantine until COVID-19 test results negative. Advised to go back to work Monday but may go sooner if COVID-19 test negative  History of iron deficiency anemia: not on supplementation but sates improved after hysterectomy years ago.Last HCT was high, she snores but very seldom and denies day time fatigue, she is not a smoker.   Hypothyroidism: history of goiter treated with iodine, sheused to seeDr. Rosario Jacks, taking Synthroid 150 mcg daily. No dry skin, change in bowel movements or weight. No dysphagia, last TSH was at goal, and on same dose for over one year. Unchanged    DMII:She is taking glipizide XL 5 mg in ams, and Metformin 1500 mg A1C  was 6% on her last visit, but glucose has been high in am in the 200's lately.  we decreased  dose of Glipizide to 2.5 mg daily. She denies polyphagia, or polyuria, but she states she has been more thirsty than usual. Eye exam is up to date. She  has been compliant with a diabetic diet  Hyperlipidemia: taking atorvastatin, deniesmyalgia, LDL was at goal in the Fall   Patient Active Problem List   Diagnosis Date Noted  . Elevated red blood cell count 02/18/2018  . 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 Status post abdominal hysterectomy and left salpingo-oophorectomy 10/25/2015  . Acquired hypothyroidism 03/13/2015  . History of iron deficiency anemia 03/13/2015  . Hypertension 08/15/2014  . Dyslipidemia associated with type 2 diabetes mellitus ([place]) 05/31/2014  . GERD (gastroesophageal reflux disease) 05/31/2014  . Hyperlipidemia 05/31/2014    Past Surgical History:  Procedure Laterality Date  . ABDOMINAL HYSTERECTOMY    . 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 BREAST BIOPSY Right 09/13/2016   Procedure: BREAST BIOPSY WITH NEEDLE LOCALIZATION;  Surgeon: [name], MD;  Location: [facility] ORS;  Service: General;  Laterality: Right;  . BREAST EXCISIONAL BIOPSY Right 06/16/2016   lumpectomy complext sclerosing lesion, benign    Family History  Problem Relation Age of Onset  . Diabetes Mother   . Diabetes Sister   . Heart disease Sister   . Diabetes Sister   . Diabetes Sister   . Thyroid disease Sister   . Diabetes Sister   . Thyroid disease Sister   . Thyroid disease Sister   . Heart disease Father   . Cancer Neg Hx   . Breast cancer Neg Hx  Social History   Socioeconomic History  . Marital status: Married    Spouse name: [name]  . Number of children: 2  . Years of education: Not on file  . Highest education level: High school graduate  Occupational History  . Not on file  Social Needs  . Financial resource strain: Not very hard  . Food insecurity    Worry: Never true    Inability: Never true  . Transportation needs    Medical: No    Non-medical: No  Tobacco Use  . Smoking status: Never Smoker  . Smokeless tobacco: Never Used   Substance and Sexual Activity  . Alcohol use: No    Alcohol/week: 0.0 standard drinks  . Drug use: No  . Sexual activity: Yes    Partners: Male    Birth control/protection: Surgical  Lifestyle  . Physical activity    Days per week: 0 days    Minutes per session: 0 min  . Stress: To some extent  Relationships  . Social connections    Talks on phone: More than three times a week    Gets together: More than three times a week    Attends religious service: More than 4 times per year    Active member of club or organization: No    Attends meetings of clubs or organizations: Never    Relationship status: Married  . Intimate partner violence    Fear of current or ex partner: No    Emotionally abused: No    Physically abused: No    Forced sexual activity: No  Other Topics Concern  . Not on file  Social History Narrative   Patient has a combined 3 children but she only birthed 2.     Current Outpatient Medications:  .  aspirin EC 81 MG tablet, Take 81 mg by mouth daily. , Disp: , Rfl:  .  atorvastatin (LIPITOR) 20 MG tablet, Take 1 tablet (20 mg total) by mouth daily., Disp: 90 tablet, Rfl: 1 .  estradiol (ESTRACE) 0.5 MG tablet, Take 1 tablet (0.5 mg total) by mouth daily., Disp: 30 tablet, Rfl: 6 .  famotidine (PEPCID) 40 MG tablet, Take 1 tablet (40 mg total) by mouth daily as needed for heartburn or indigestion., Disp: 90 tablet, Rfl: 0 .  fluticasone (FLONASE) 50 MCG/ACT nasal spray, Place 2 sprays into both nostrils daily., Disp: 16 g, Rfl: 0 .  glipiZIDE (GLUCOTROL XL) 2.5 MG 24 hr tablet, Take 1 tablet (2.5 mg total) by mouth daily with breakfast., Disp: 90 tablet, Rfl: 1 .  glucose blood (ONETOUCH VERIO) test strip, Use as instructed, Disp: 100 each, Rfl: 12 .  levothyroxine (SYNTHROID, LEVOTHROID) 150 MCG tablet, Take 1 tablet (150 mcg total) by mouth daily., Disp: 90 tablet, Rfl: 0 .  metFORMIN (GLUCOPHAGE-XR) 750 MG 24 hr tablet, Take 2 tablets (1,500 mg total) by mouth  daily with breakfast., Disp: 180 tablet, Rfl: 1 .  Multiple Vitamin (MULTIVITAMIN) capsule, Take 1 capsule by mouth daily., Disp: , Rfl:  .  telmisartan-hydrochlorothiazide (MICARDIS HCT) 80-25 MG tablet, Take 1 tablet by mouth daily., Disp: 90 tablet, Rfl: 1  No Known Allergies  I personally reviewed active problem list, medication list, allergies, family history, social history with the patient/caregiver today.   ROS  Ten systems reviewed and is negative except as mentioned in HPI   Objective  Virtual encounter, vitals not obtained.  There is no height or weight on file to calculate BMI.  Physical Exam  Awake,  alert and oriented   PHQ2/9: Depression screen [facility] 2/9 11/11/2018 06/16/2018 02/13/2018 02/10/2018 10/14/2017  Decreased Interest 0 0 0 0 0  Down, Depressed, Hopeless 0 0 0 0 0  PHQ - 2 Score 0 0 0 0 0  Altered sleeping 0 - 0 0 -  Tired, decreased energy 0 - 0 0 -  Change in appetite 0 - 0 0 -  Feeling bad or failure about yourself  0 - 0 0 -  Trouble concentrating 0 - 0 0 -  Moving slowly or fidgety/restless 0 - 0 0 -  Suicidal thoughts 0 - 0 0 -  PHQ-9 Score 0 - 0 0 -  Difficult doing work/chores - - Not difficult at all - -   PHQ-2/9 Result is negative.    Fall Risk: Fall Risk  06/16/2018 02/10/2018 10/14/2017 07/11/2017 01/27/2017  Falls in the past year? 0 No No No Yes  Number falls in past yr: 0 - - - 1  Injury with Fall? 0 - - - Yes    Assessment & Plan  1. Dyslipidemia associated with type 2 diabetes mellitus ([facility]CC)  - atorvastatin (LIPITOR) 20 MG tablet; Take 1 tablet (20 mg total) by mouth daily.  Dispense: 90 tablet; Refill: 1 - glipiZIDE (GLUCOTROL XL) 2.5 MG 24 hr tablet; Take 1 tablet (2.5 mg total) by mouth daily with breakfast.  Dispense: 90 tablet; Refill: 1 - metFORMIN (GLUCOPHAGE-XR) 750 MG 24 hr tablet; Take 2 tablets (1,500 mg total) by mouth daily with breakfast.  Dispense: 180 tablet; Refill: 1  2. Dyslipidemia  - atorvastatin (LIPITOR)  20 MG tablet; Take 1 tablet (20 mg total) by mouth daily.  Dispense: 90 tablet; Refill: 1  3. Acquired hypothyroidism  - levothyroxine (SYNTHROID) 150 MCG tablet; Take 1 tablet (150 mcg total) by mouth daily.  Dispense: 90 tablet; Refill: 1  4. Hypertension, benign  - telmisartan-hydrochlorothiazide (MICARDIS HCT) 80-25 MG tablet; Take 1 tablet by mouth daily.  Dispense: 90 tablet; Refill: 1  5. Exposure to Covid-19 Virus  Discussed staying at home until results negative   I discussed the assessment and treatment plan with the patient. The patient was provided an opportunity to ask questions and all were answered. The patient agreed with the plan and demonstrated an understanding of the instructions.   The patient was advised to call back or seek an in-person evaluation if the symptoms worsen or if the condition fails to improve as anticipated.  I provided 25 minutes of non-face-to-face time during this encounter.  Loistine Chance, MD

## 2018-11-12 ENCOUNTER — Telehealth: Payer: Self-pay | Admitting: Family Medicine

## 2018-11-12 DIAGNOSIS — E1169 Type 2 diabetes mellitus with other specified complication: Secondary | ICD-10-CM

## 2018-11-12 NOTE — Telephone Encounter (Signed)
Pt saw dr Ancil Boozer yesterday  and her BS is still elevated around 200's . Pt said md talked about increasing glipizide 2.5 mg to 5 mg. Pt still have some 2.5 mg. Pt would like to know if md would like her to take 2.5 mg twice a day or take 5 mg once a day.  [facility]

## 2018-11-13 NOTE — Addendum Note (Signed)
Addended by: Inda Coke on: 11/13/2018 10:29 AM   Modules accepted: Orders

## 2018-11-13 NOTE — Telephone Encounter (Signed)
Patient notified and pharmacy needs a update prescription since it is now to be taking bid instead of once daily.

## 2018-11-15 MED ORDER — GLIPIZIDE ER 2.5 MG PO TB24: 2.5000 mg | ORAL_TABLET | Freq: Two times a day (BID) | ORAL | 0 refills | Status: DC

## 2018-11-15 NOTE — Addendum Note (Signed)
Addended by: Steele Sizer F on: 11/15/2018 06:12 PM   Modules accepted: Orders

## 2018-11-17 ENCOUNTER — Encounter: Payer: Self-pay | Admitting: Obstetrics and Gynecology

## 2018-11-17 ENCOUNTER — Encounter: Payer: Federal, State, Local not specified - PPO | Admitting: Obstetrics and Gynecology

## 2018-11-17 ENCOUNTER — Ambulatory Visit (INDEPENDENT_AMBULATORY_CARE_PROVIDER_SITE_OTHER): Payer: Federal, State, Local not specified - PPO | Admitting: Obstetrics and Gynecology

## 2018-11-17 ENCOUNTER — Other Ambulatory Visit: Payer: Self-pay

## 2018-11-17 VITALS — BP 118/82 | HR 80 | Ht 61.0 in | Wt 169.6 lb

## 2018-11-17 DIAGNOSIS — Z1231 Encounter for screening mammogram for malignant neoplasm of breast: Secondary | ICD-10-CM

## 2018-11-17 DIAGNOSIS — Z79890 Hormone replacement therapy (postmenopausal): Secondary | ICD-10-CM

## 2018-11-17 DIAGNOSIS — Z90710 Acquired absence of both cervix and uterus: Secondary | ICD-10-CM | POA: Diagnosis not present

## 2018-11-17 DIAGNOSIS — Z90721 Acquired absence of ovaries, unilateral: Secondary | ICD-10-CM

## 2018-11-17 DIAGNOSIS — E1169 Type 2 diabetes mellitus with other specified complication: Secondary | ICD-10-CM

## 2018-11-17 DIAGNOSIS — I10 Essential (primary) hypertension: Secondary | ICD-10-CM

## 2018-11-17 DIAGNOSIS — Z20828 Contact with and (suspected) exposure to other viral communicable diseases: Secondary | ICD-10-CM

## 2018-11-17 DIAGNOSIS — M25512 Pain in left shoulder: Secondary | ICD-10-CM

## 2018-11-17 DIAGNOSIS — Z01419 Encounter for gynecological examination (general) (routine) without abnormal findings: Secondary | ICD-10-CM | POA: Diagnosis not present

## 2018-11-17 DIAGNOSIS — Z9079 Acquired absence of other genital organ(s): Secondary | ICD-10-CM

## 2018-11-17 DIAGNOSIS — E669 Obesity, unspecified: Secondary | ICD-10-CM

## 2018-11-17 DIAGNOSIS — E785 Hyperlipidemia, unspecified: Secondary | ICD-10-CM

## 2018-11-17 DIAGNOSIS — E039 Hypothyroidism, unspecified: Secondary | ICD-10-CM

## 2018-11-17 DIAGNOSIS — Z20822 Contact with and (suspected) exposure to covid-19: Secondary | ICD-10-CM

## 2018-11-17 NOTE — Progress Notes (Signed)
Pt is present for an annual exam. Pt stated that she was doing well no problems.

## 2018-11-17 NOTE — Patient Instructions (Addendum)
Shoulder Pain Many things can cause shoulder pain, including:  An injury to the shoulder.  Overuse of the shoulder.  Arthritis. The source of the pain can be:  Inflammation.  An injury to the shoulder joint.  An injury to a tendon, ligament, or bone. Follow these instructions at home: Pay attention to changes in your symptoms. Let your health care provider know about them. Follow these instructions to relieve your pain. If you have a sling:  Wear the sling as told by your health care provider. Remove it only as told by your health care provider.  Loosen the sling if your fingers tingle, become numb, or turn cold and blue.  Keep the sling clean.  If the sling is not waterproof: ? Do not let it get wet. Remove it to shower or bathe.  Move your arm as little as possible, but keep your hand moving to prevent swelling. Managing pain, stiffness, and swelling   If directed, put ice on the painful area: ? Put ice in a plastic bag. ? Place a towel between your skin and the bag. ? Leave the ice on for 20 minutes, 2-3 times per day. Stop applying ice if it does not help with the pain.  Squeeze a soft ball or a foam pad as much as possible. This helps to keep the shoulder from swelling. It also helps to strengthen the arm. General instructions  Take over-the-counter and prescription medicines only as told by your health care provider.  Keep all follow-up visits as told by your health care provider. This is important. Contact a health care provider if:  Your pain gets worse.  Your pain is not relieved with medicines.  New pain develops in your arm, hand, or fingers. Get help right away if:  Your arm, hand, or fingers: ? Tingle. ? Become numb. ? Become swollen. ? Become painful. ? Turn white or blue. Summary  Shoulder pain can be caused by an injury, overuse, or arthritis.  Pay attention to changes in your symptoms. Let your health care provider know about them.   This condition may be treated with a sling, ice, and pain medicines.  Contact your health care provider if the pain gets worse or new pain develops. Get help right away if your arm, hand, or fingers tingle or become numb, swollen, or painful.  Keep all follow-up visits as told by your health care provider. This is important. This information is not intended to replace advice given to you by your health care provider. Make sure you discuss any questions you have with your health care provider. Document Released: 01/23/2005 Document Revised: 10/28/2017 Document Reviewed: 10/28/2017 Elsevier Patient Education  2020 Valerie Manning Maintenance, Female Adopting a healthy lifestyle and getting preventive care are important in promoting health and wellness. Ask your health care provider about:  The right schedule for you to have regular tests and exams.  Things you can do on your own to prevent diseases and keep yourself healthy. What should I know about diet, weight, and exercise? Eat a healthy diet   Eat a diet that includes plenty of vegetables, fruits, low-fat dairy products, and lean protein.  Do not eat a lot of foods that are high in solid fats, added sugars, or sodium. Maintain a healthy weight Body mass index (BMI) is used to identify weight problems. It estimates body fat based on height and weight. Your health care provider can help determine your BMI and help you achieve or maintain a healthy  weight. Get regular exercise Get regular exercise. This is one of the most important things you can do for your health. Most adults should:  Exercise for at least 150 minutes each week. The exercise should increase your heart rate and make you sweat (moderate-intensity exercise).  Do strengthening exercises at least twice a week. This is in addition to the moderate-intensity exercise.  Spend less time sitting. Even light physical activity can be beneficial. Watch cholesterol and blood  lipids Have your blood tested for lipids and cholesterol at 54 years of age, then have this test every 5 years. Have your cholesterol levels checked more often if:  Your lipid or cholesterol levels are high.  You are older than 54 years of age.  You are at high risk for heart disease. What should I know about cancer screening? Depending on your health history and family history, you may need to have cancer screening at various ages. This may include screening for:  Breast cancer.  Cervical cancer.  Colorectal cancer.  Skin cancer.  Lung cancer. What should I know about heart disease, diabetes, and high blood pressure? Blood pressure and heart disease  High blood pressure causes heart disease and increases the risk of stroke. This is more likely to develop in people who have high blood pressure readings, are of African descent, or are overweight.  Have your blood pressure checked: ? Every 3-5 years if you are 20-61 years of age. ? Every year if you are 50 years old or older. Diabetes Have regular diabetes screenings. This checks your fasting blood sugar level. Have the screening done:  Once every three years after age 49 if you are at a normal weight and have a low risk for diabetes.  More often and at a younger age if you are overweight or have a high risk for diabetes. What should I know about preventing infection? Hepatitis B If you have a higher risk for hepatitis B, you should be screened for this virus. Talk with your health care provider to find out if you are at risk for hepatitis B infection. Hepatitis C Testing is recommended for:  Everyone born from [birth year] through 1966.  Anyone with known risk factors for hepatitis C. Sexually transmitted infections (STIs)  Get screened for STIs, including gonorrhea and chlamydia, if: ? You are sexually active and are younger than 54 years of age. ? You are older than 54 years of age and your health care provider tells you that  you are at risk for this type of infection. ? Your sexual activity has changed since you were last screened, and you are at increased risk for chlamydia or gonorrhea. Ask your health care provider if you are at risk.  Ask your health care provider about whether you are at high risk for HIV. Your health care provider may recommend a prescription medicine to help prevent HIV infection. If you choose to take medicine to prevent HIV, you should first get tested for HIV. You should then be tested every 3 months for as long as you are taking the medicine. Pregnancy  If you are about to stop having your period (premenopausal) and you may become pregnant, seek counseling before you get pregnant.  Take 400 to 800 micrograms (mcg) of folic acid every day if you become pregnant.  Ask for birth control (contraception) if you want to prevent pregnancy. Osteoporosis and menopause Osteoporosis is a disease in which the bones lose minerals and strength with aging. This can result in  bone fractures. If you are 57 years old or older, or if you are at risk for osteoporosis and fractures, ask your health care provider if you should:  Be screened for bone loss.  Take a calcium or vitamin D supplement to lower your risk of fractures.  Be given hormone replacement therapy (HRT) to treat symptoms of menopause. Follow these instructions at home: Lifestyle  Do not use any products that contain nicotine or tobacco, such as cigarettes, e-cigarettes, and chewing tobacco. If you need help quitting, ask your health care provider.  Do not use street drugs.  Do not share needles.  Ask your health care provider for help if you need support or information about quitting drugs. Alcohol use  Do not drink alcohol if: ? Your health care provider tells you not to drink. ? You are pregnant, may be pregnant, or are planning to become pregnant.  If you drink alcohol: ? Limit how much you use to 0-1 drink a day. ? Limit  intake if you are breastfeeding.  Be aware of how much alcohol is in your drink. In the [place] one drink equals one 12 oz bottle of beer (355 mL), one 5 oz glass of wine (148 mL), or one 1 oz glass of hard liquor (44 mL). General instructions  Schedule regular health, dental, and eye exams.  Stay current with your vaccines.  Tell your health care provider if: ? You often feel depressed. ? You have ever been abused or do not feel safe at home. Summary  Adopting a healthy lifestyle and getting preventive care are important in promoting health and wellness.  Follow your health care provider's instructions about healthy diet, exercising, and getting tested or screened for diseases.  Follow your health care provider's instructions on monitoring your cholesterol and blood pressure. This information is not intended to replace advice given to you by your health care provider. Make sure you discuss any questions you have with your health care provider. Document Released: 10/29/2010 Document Revised: 04/08/2018 Document Reviewed: 04/08/2018 Elsevier Patient Education  2020 Bee self-awareness is knowing how your breasts look and feel. Doing breast self-awareness is important. It allows you to catch a breast problem early while it is still small and can be treated. All women should do breast self-awareness, including women who have had breast implants. Tell your doctor if you notice a change in your breasts. What you need:  A mirror.  A well-lit room. How to do a breast self-exam A breast self-exam is one way to learn what is normal for your breasts and to check for changes. To do a breast self-exam: Look for changes  1. Take off all the clothes above your waist. 2. Stand in front of a mirror in a room with good lighting. 3. Put your hands on your hips. 4. Push your hands down. 5. Look at your breasts and nipples in the mirror to see if one breast  or nipple looks different from the other. Check to see if: ? The shape of one breast is different. ? The size of one breast is different. ? There are wrinkles, dips, and bumps in one breast and not the other. 6. Look at each breast for changes in the skin, such as: ? Redness. ? Scaly areas. 7. Look for changes in your nipples, such as: ? Liquid around the nipples. ? Bleeding. ? Dimpling. ? Redness. ? A change in where the nipples are. Feel for changes  1. Lie on your back on the floor. 2. Feel each breast. To do this, follow these steps: ? Pick a breast to feel. ? Put the arm closest to that breast above your head. ? Use your other arm to feel the nipple area of your breast. Feel the area with the pads of your three middle fingers by making small circles with your fingers. For the first circle, press lightly. For the second circle, press harder. For the third circle, press even harder. ? Keep making circles with your fingers at the different pressures as you move down your breast. Stop when you feel your ribs. ? Move your fingers a little toward the center of your body. ? Start making circles with your fingers again, this time going up until you reach your collarbone. ? Keep making up-and-down circles until you reach your armpit. Remember to keep using the three pressures. ? Feel the other breast in the same way. 3. Sit or stand in the tub or shower. 4. With soapy water on your skin, feel each breast the same way you did in step 2 when you were lying on the floor. Write down what you find Writing down what you find can help you remember what to tell your doctor. Write down:  What is normal for each breast.  Any changes you find in each breast, including: ? The kind of changes you find. ? Whether you have pain. ? Size and location of any lumps.  When you last had your menstrual period. General tips  Check your breasts every month.  If you are breastfeeding, the best time to  check your breasts is after you feed your baby or after you use a breast pump.  If you get menstrual periods, the best time to check your breasts is 5-7 days after your menstrual period is over.  With time, you will become comfortable with the self-exam, and you will begin to know if there are changes in your breasts. Contact a doctor if you:  See a change in the shape or size of your breasts or nipples.  See a change in the skin of your breast or nipples, such as red or scaly skin.  Have fluid coming from your nipples that is not normal.  Find a lump or thick area that was not there before.  Have pain in your breasts.  Have any concerns about your breast health. Summary  Breast self-awareness includes looking for changes in your breasts, as well as feeling for changes within your breasts.  Breast self-awareness should be done in front of a mirror in a well-lit room.  You should check your breasts every month. If you get menstrual periods, the best time to check your breasts is 5-7 days after your menstrual period is over.  Let your doctor know of any changes you see in your breasts, including changes in size, changes on the skin, pain or tenderness, or fluid from your nipples that is not normal. This information is not intended to replace advice given to you by your health care provider. Make sure you discuss any questions you have with your health care provider. Document Released: 10/02/2007 Document Revised: 12/02/2017 Document Reviewed: 12/02/2017 Elsevier Patient Education  2020 [employer].

## 2018-11-17 NOTE — Progress Notes (Signed)
ANNUAL PREVENTATIVE CARE GYNECOLOGY  ENCOUNTER NOTE  Subjective:       Valerie Manning is a 53 y.o. [id number] female here for a routine annual gynecologic exam. The patient is sexually active. The patient is taking hormone replacement therapy. Patient denies post-menopausal vaginal bleeding. The patient wears seatbelts: yes. The patient participates in regular exercise: no. Has the patient ever been transfused or tattooed?: no. The patient reports that there is not domestic violence in her life.  Current complaints: 1.  Shoulder pain on left for 1 month. Thinks it may be arthritis (but does not have a prior history or diagnosis). Does note increased arm activity at work [employer], increased wiping). Has difficulty lifting arm above her head. Does not take anything for the pain.   2. Notes recent possible exposure to Coronavirus from her daughter who had been in [place] (daughter tested positive).  Patient and her husband both recently tested negative.   Gynecologic History No LMP recorded. Patient has had a hysterectomy. Contraception: status post hysterectomy Last Pap: 4 years ago. Results were: normal Last mammogram: 10/15/2018. Results were: abnormal - BIRADS 0 (left breast asymmetry).  Normal diagnostic mammogram 10/26/2018.  Last Colonoscopy: 09/13/2012. Results were: normal.    Obstetric History OB History  Gravida Para Term Preterm AB Living  2 2 2     2   SAB TAB Ectopic Multiple Live Births          2    # Outcome Date GA Lbr Len/2nd Weight Sex Delivery Anes PTL Lv  2 Term 1996   7 lb 6.4 oz (3.357 kg) F Vag-Spont   LIV  1 Term 1993   7 lb 11.2 oz (3.493 kg) F Vag-Spont   LIV    Obstetric Comments  1st Menstrual Cycle:  13  1st Pregnancy:  26    Past Medical History:  Diagnosis Date  . Cramp in muscle    H/O OF MUSCLE CRAMP IN CHEST AND STOMACH OCCAS WHEN BENDS AT WAIST. WHEN STANDS AND BENDS BACK HEARS POP. CRAMP GOES AWAY. HAS NOT HAPPENES IN MANY MONTHS   . Diabetes mellitus without complication ([place])   . Hyperlipidemia   . Hypertension   . Left ear impacted cerumen 03/31/2017    Family History  Problem Relation Age of Onset  . Diabetes Mother   . Diabetes Sister   . Heart disease Sister   . Diabetes Sister   . Diabetes Sister   . Thyroid disease Sister   . Diabetes Sister   . Thyroid disease Sister   . Thyroid disease Sister   . Heart disease Father   . Cancer Neg Hx   . Breast cancer Neg Hx     Past Surgical History:  Procedure Laterality Date  . ABDOMINAL HYSTERECTOMY    . 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 BREAST BIOPSY Right 09/13/2016   Procedure: BREAST BIOPSY WITH NEEDLE LOCALIZATION;  Surgeon: [name], MD;  Location: [facility] ORS;  Service: General;  Laterality: Right;  . BREAST EXCISIONAL BIOPSY Right 06/16/2016   lumpectomy complext sclerosing lesion, benign    Social History   Socioeconomic History  . Marital status: Married    Spouse name: [name]  . Number of children: 2  . Years of education: Not on file  . Highest education level: High school graduate  Occupational History  . Not on file  Social Needs  . Financial resource strain:  Not very hard  . Food insecurity    Worry: Never true    Inability: Never true  . Transportation needs    Medical: No    Non-medical: No  Tobacco Use  . Smoking status: Never Smoker  . Smokeless tobacco: Never Used  Substance and Sexual Activity  . Alcohol use: No    Alcohol/week: 0.0 standard drinks  . Drug use: No  . Sexual activity: Yes    Partners: Male    Birth control/protection: Surgical  Lifestyle  . Physical activity    Days per week: 0 days    Minutes per session: 0 min  . Stress: To some extent  Relationships  . Social connections    Talks on phone: More than three times a week    Gets together: More than three times a week    Attends religious service: More than 4 times  per year    Active member of club or organization: No    Attends meetings of clubs or organizations: Never    Relationship status: Married  . Intimate partner violence    Fear of current or ex partner: No    Emotionally abused: No    Physically abused: No    Forced sexual activity: No  Other Topics Concern  . Not on file  Social History Narrative   Patient has a combined 3 children but she only birthed 2.    Current Outpatient Medications on File Prior to Visit  Medication Sig Dispense Refill  . aspirin EC 81 MG tablet Take 81 mg by mouth daily.     Marland Kitchen atorvastatin (LIPITOR) 20 MG tablet Take 1 tablet (20 mg total) by mouth daily. 90 tablet 1  . estradiol (ESTRACE) 0.5 MG tablet Take 1 tablet (0.5 mg total) by mouth daily. 30 tablet 6  . famotidine (PEPCID) 40 MG tablet Take 1 tablet (40 mg total) by mouth daily as needed for heartburn or indigestion. 90 tablet 1  . glipiZIDE (GLUCOTROL XL) 2.5 MG 24 hr tablet Take 1 tablet (2.5 mg total) by mouth 2 (two) times daily before a meal. 180 tablet 0  . glucose blood (ONETOUCH VERIO) test strip Use as instructed 100 each 12  . levothyroxine (SYNTHROID) 150 MCG tablet Take 1 tablet (150 mcg total) by mouth daily. 90 tablet 1  . metFORMIN (GLUCOPHAGE-XR) 750 MG 24 hr tablet Take 2 tablets (1,500 mg total) by mouth daily with breakfast. 180 tablet 1  . Multiple Vitamin (MULTIVITAMIN) capsule Take 1 capsule by mouth daily.    Marland Kitchen telmisartan-hydrochlorothiazide (MICARDIS HCT) 80-25 MG tablet Take 1 tablet by mouth daily. 90 tablet 1  . fluticasone (FLONASE) 50 MCG/ACT nasal spray Place 2 sprays into both nostrils daily. (Patient not taking: Reported on 11/17/2018) 16 g 0   No current facility-administered medications on file prior to visit.     No Known Allergies    Review of Systems ROS Review of Systems - General ROS: negative for - chills, fatigue, fever, hot flashes, night sweats, weight gain or weight loss Psychological ROS: negative  for - anxiety, decreased libido, depression, mood swings, physical abuse or sexual abuse Ophthalmic ROS: negative for - blurry vision, eye pain or loss of vision ENT ROS: negative for - headaches, hearing change, visual changes or vocal changes Allergy and Immunology ROS: negative for - hives, itchy/watery eyes or seasonal allergies Hematological and Lymphatic ROS: negative for - bleeding problems, bruising, swollen lymph nodes or weight loss Endocrine ROS: negative for - galactorrhea,  hair pattern changes, hot flashes, malaise/lethargy, mood swings, palpitations, polydipsia/polyuria, skin changes, temperature intolerance or unexpected weight changes Breast ROS: negative for - new or changing breast lumps or nipple discharge Respiratory ROS: negative for - cough or shortness of breath Cardiovascular ROS: negative for - chest pain, irregular heartbeat, palpitations or shortness of breath Gastrointestinal ROS: no abdominal pain, change in bowel habits, or black or bloody stools Genito-Urinary ROS: no dysuria, trouble voiding, or hematuria Musculoskeletal ROS: positive for - joint pain (left shoulder, see HPI).  Negative for joint stiffness Neurological ROS: negative for - bowel and bladder control changes Dermatological ROS: negative for rash and skin lesion changes   Objective:   BP 118/82   Pulse 80   Ht 5\' 1"  (1.549 m)   Wt 169 lb 9.6 oz (76.9 kg)   BMI 32.05 kg/m  CONSTITUTIONAL: Well-developed, well-nourished female in no acute distress.  PSYCHIATRIC: Normal mood and affect. Normal behavior. Normal judgment and thought content. [place]: Alert and oriented to person, place, and time. Normal muscle tone coordination. No cranial nerve deficit noted. HENT:  Normocephalic, atraumatic, External right and left ear normal. Oropharynx is clear and moist EYES: Conjunctivae and EOM are normal. Pupils are equal, round, and reactive to light. No scleral icterus.  NECK: Normal range of motion,  supple, no masses.  Normal thyroid.  SKIN: Skin is warm and dry. No rash noted. Not diaphoretic. No erythema. No pallor. CARDIOVASCULAR: Normal heart rate noted, regular rhythm, no murmur. RESPIRATORY: Clear to auscultation bilaterally. Effort and breath sounds normal, no problems with respiration noted. BREASTS: Symmetric in size. No masses, skin changes, nipple drainage, or lymphadenopathy. ABDOMEN: Soft, normal bowel sounds, no distention noted.  No tenderness, rebound or guarding.  BLADDER: Normal PELVIC:  Bladder no bladder distension noted  Urethra: normal appearing urethra with no masses, tenderness or lesions  Vulva: normal appearing vulva with no masses, tenderness or lesions  Vagina: normal appearing vagina with normal color and discharge, no lesions  Cervix: surgically absent  Uterus: surgically absent, vaginal cuff well healed  Adnexa: normal right adnexa in size, nontender and no masses and surgically absent left adnexa  RV: External Exam NormaI, No Rectal Masses and Normal Sphincter tone  MUSCULOSKELETAL:  limited mobility with abduction, difficulty raising arm above shoulder level, can extend arm to back. No tenderness.  No cyanosis, clubbing, or edema.  2+ distal pulses. LYMPHATIC: No Axillary, Supraclavicular, or Inguinal Adenopathy.   Labs: Lab Results  Component Value Date   WBC 4.3 02/13/2018   HGB 14.7 02/13/2018   HCT 46.3 (H) 02/13/2018   MCV 80.4 02/13/2018   PLT 261 02/13/2018    Lab Results  Component Value Date   CREATININE 0.61 02/13/2018   BUN 11 02/13/2018   NA 140 02/13/2018   K 4.0 02/13/2018   CL 101 02/13/2018   CO2 30 02/13/2018    Lab Results  Component Value Date   ALT 34 02/13/2018   AST 23 02/13/2018   ALKPHOS 113 02/13/2018   BILITOT 0.7 02/13/2018    Lab Results  Component Value Date   CHOL 146 02/13/2018   HDL 63 02/13/2018   LDLCALC 67 02/13/2018   TRIG 82 02/13/2018   CHOLHDL 2.3 02/13/2018    Lab Results  Component  Value Date   TSH 1.301 02/13/2018    Lab Results  Component Value Date   HGBA1C 6.0 (A) 06/16/2018     Assessment:   Well woman exam with routine gynecological exam Status post abdominal  hysterectomy and left salpingo-oophorectomy  Post-menopause on HRT (hormone replacement therapy)  Obesity (BMI 30.0-34.9)  Acute pain of left shoulder  Close Exposure to 2019 Novel Coronavirus Dyslipidemia associated with diabetes mellitus Hypertension Acquired hypothyroidism  Plan:  Pap: Not needed. Patient is s/p hysterectomy.  Mammogram: Ordered Stool Guaiac Testing:  Not Ordered. Patient up to date on colonoscopy.  Labs: Labs to be drawn by PCP.  Routine preventative health maintenance measures emphasized: Exercise/Diet/Weight control, Tobacco Warnings, Alcohol/Substance use risks, Stress Management and Safe Sex  Comorbidities (Dyslipidemia, Hypothyroidism, DM) managed by PCP). Discussed application of ice packs/warm compresses, or use of icy-hot/biofreeze to shoulder for pain relief. Can also consider a lidocaine patch. Given handout on appropriate stretches. If not resolved by the time of visit with PCP next month, can further address at that appointment.  Patient with fairly recent COVID exposure, tested negative. Encouraged safe sheltering, social distancing, use of facemask.  Return to [facility] for annual exam   Rubie Maid, MD

## 2018-11-18 ENCOUNTER — Encounter: Payer: Self-pay | Admitting: Obstetrics and Gynecology

## 2018-11-30 ENCOUNTER — Other Ambulatory Visit: Payer: Self-pay | Admitting: Family Medicine

## 2018-11-30 DIAGNOSIS — E1169 Type 2 diabetes mellitus with other specified complication: Secondary | ICD-10-CM

## 2018-11-30 DIAGNOSIS — E785 Hyperlipidemia, unspecified: Secondary | ICD-10-CM

## 2018-11-30 MED ORDER — GLIPIZIDE ER 2.5 MG PO TB24: 2.5000 mg | ORAL_TABLET | Freq: Two times a day (BID) | ORAL | 0 refills | Status: DC

## 2018-11-30 NOTE — Telephone Encounter (Signed)
Medication Refill - Medication:  glipiZIDE (GLUCOTROL XL) 2.5 MG 24 hr tablet   Has the patient contacted their pharmacy? Yes advised to call office. Patient called in stating pharmacy only 30 pills and that instructions state once a day but she doubled. Patient only has 3 pills left.   Preferred Pharmacy (with phone number or street name):  [id number] [name], [place] [phone] (Phone) [phone] (Fax)   Agent: Please be advised that RX refills may take up to 3 business days. We ask that you follow-up with your pharmacy.

## 2018-12-18 ENCOUNTER — Other Ambulatory Visit: Payer: Self-pay | Admitting: Family Medicine

## 2018-12-18 ENCOUNTER — Ambulatory Visit
Admission: RE | Admit: 2018-12-18 | Discharge: 2018-12-18 | Disposition: A | Discharge status: Home / Self Care | Payer: Federal, State, Local not specified - PPO | Source: Ambulatory Visit | Attending: Family Medicine | Admitting: Family Medicine

## 2018-12-18 ENCOUNTER — Other Ambulatory Visit: Payer: Self-pay

## 2018-12-18 DIAGNOSIS — M25512 Pain in left shoulder: Secondary | ICD-10-CM | POA: Insufficient documentation

## 2018-12-31 ENCOUNTER — Other Ambulatory Visit: Payer: Self-pay | Admitting: Family Medicine

## 2018-12-31 DIAGNOSIS — E1169 Type 2 diabetes mellitus with other specified complication: Secondary | ICD-10-CM

## 2018-12-31 NOTE — Telephone Encounter (Signed)
Requested medication (s) are due for refill today: yes  Requested medication (s) are on the active medication list: yes  Last refill:  12/01/2018  Future visit scheduled: yes  Notes to clinic:  Review for refill   Requested Prescriptions  Pending Prescriptions Disp Refills   glipiZIDE (GLUCOTROL XL) 2.5 MG 24 hr tablet [Pharmacy Med Name: GLIPIZIDE ER 2.5MG  TABLET] 180 tablet 0    Sig: TAKE 1 TABLET BY MOUTH TWICE DAILY BEFORE A MEAL     Endocrinology:  Diabetes - Sulfonylureas Failed - 12/31/2018  3:28 PM      Failed - HBA1C is between 0 and 7.9 and within 180 days    Hemoglobin A1C  Date Value Ref Range Status  06/16/2018 6.0 (A) 4.0 - 5.6 % Final  05/19/2017 6.5  Final   Hgb A1c MFr Bld  Date Value Ref Range Status  02/13/2018 6.6 (H) 4.8 - 5.6 % Final    Comment:    (NOTE)         Prediabetes: 5.7 - 6.4         Diabetes: >6.4         Glycemic control for adults with diabetes: <7.0          Passed - Valid encounter within last 6 months    Recent Outpatient Visits          1 month ago Exposure to [facility] Steele Sizer, MD   6 months ago Dyslipidemia associated with type 2 diabetes mellitus [facility])   [facility] Steele Sizer, MD   10 months ago Dyslipidemia associated with type 2 diabetes mellitus [facility])   [facility] Steele Sizer, MD   10 months ago URI, acute   [facility], Drue Stager, MD   1 year ago Dyslipidemia associated with type 2 diabetes mellitus [facility])   [facility] Steele Sizer, MD      Future Appointments            In 1 week Steele Sizer, MD [facility] [place], [place]   In 10 months Rubie Maid, MD [facility]

## 2019-01-12 ENCOUNTER — Ambulatory Visit: Payer: Federal, State, Local not specified - PPO | Admitting: Family Medicine

## 2019-01-12 ENCOUNTER — Encounter: Payer: Self-pay | Admitting: Family Medicine

## 2019-01-12 ENCOUNTER — Other Ambulatory Visit: Payer: Self-pay

## 2019-01-12 VITALS — BP 120/90 | HR 80 | Temp 97.1°F | Resp 16 | Ht 61.0 in | Wt 170.2 lb

## 2019-01-12 DIAGNOSIS — E039 Hypothyroidism, unspecified: Secondary | ICD-10-CM | POA: Diagnosis not present

## 2019-01-12 DIAGNOSIS — E1169 Type 2 diabetes mellitus with other specified complication: Secondary | ICD-10-CM | POA: Diagnosis not present

## 2019-01-12 DIAGNOSIS — I10 Essential (primary) hypertension: Secondary | ICD-10-CM | POA: Diagnosis not present

## 2019-01-12 DIAGNOSIS — E785 Hyperlipidemia, unspecified: Secondary | ICD-10-CM

## 2019-01-12 DIAGNOSIS — G8929 Other chronic pain: Secondary | ICD-10-CM

## 2019-01-12 DIAGNOSIS — M25512 Pain in left shoulder: Secondary | ICD-10-CM

## 2019-01-12 LAB — POCT GLYCOSYLATED HEMOGLOBIN (HGB A1C): Hemoglobin A1C: 6.3 % — AB (ref 4.0–5.6)

## 2019-01-12 MED ORDER — GLIPIZIDE ER 2.5 MG PO TB24: 2.5000 mg | ORAL_TABLET | Freq: Two times a day (BID) | ORAL | 1 refills | Status: DC

## 2019-01-12 NOTE — Progress Notes (Signed)
Name: Valerie Manning   MRN: [id number]    DOB: [birth year]   Date:01/12/2019       Progress Note  Subjective  Chief Complaint  Chief Complaint  Patient presents with  . Diabetes  . Hypertension  . Hypothyroidism  . Gastroesophageal Reflux    HPI  Left Shoulder pain: she has an overusing injury from work - works Water engineer at [employer] and during COVID-19 the ortho clinic was closed and she had to spend more time cleaning and detailing. She has follow up with employee clinic this Friday, had X-ray and is using topical medication and also doing home exercises. She is still working, may need to see Ortrho  HTN:  At goal, no chest pain or palpitation   Hypothyroidism: history of goiter treated with iodine, sheused to seeDr. Rosario Jacks, taking Synthroid 150 mcg daily. No dry skin, change in bowel movements or weight. No dysphagia,last TSH was at goal, and on same dose for over one year. Recheck it today    DMII:She is taking glipizide XL 5 mg in ams, and Metformin 1500 mgA1C  was 6% on her last visit and today was 6.3% , but glucose has been high in am in the 200's lately.  we decreased  dose of Glipizide to 2.5 mg daily, but she is now on glipizide bid and states when she was checking glucose it was still going up to 180 's om the a,s. She has episodes of polyphagia, but not polydipsia or polyuria  Eye exam is up to date. She has been compliant with a diabetic diet. She denies hypoglycemic episodes   Hyperlipidemia: taking atorvastatin, deniesmyalgia,LDL , we will recheck labs   GERD: only has epigastric pain about twice a month when she eats something with red sauce or spicy, she has not been taking Pepcid, advised to take Tums prn and avoid triggers    Patient Active Problem List   Diagnosis Date Noted  . Elevated red blood cell count 02/18/2018  . 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 Status post abdominal hysterectomy and left salpingo-oophorectomy 10/25/2015  . Acquired  hypothyroidism 03/13/2015  . History of iron deficiency anemia 03/13/2015  . Hypertension 08/15/2014  . Dyslipidemia associated with type 2 diabetes mellitus ([place]) 05/31/2014  . GERD (gastroesophageal reflux disease) 05/31/2014  . Hyperlipidemia 05/31/2014    Past Surgical History:  Procedure Laterality Date  . ABDOMINAL HYSTERECTOMY    . 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 BREAST BIOPSY Right 09/13/2016   Procedure: BREAST BIOPSY WITH NEEDLE LOCALIZATION;  Surgeon: [name], MD;  Location: [facility] ORS;  Service: General;  Laterality: Right;  . BREAST EXCISIONAL BIOPSY Right 06/16/2016   lumpectomy complext sclerosing lesion, benign    Family History  Problem Relation Age of Onset  . Diabetes Mother   . Diabetes Sister   . Heart disease Sister   . Diabetes Sister   . Diabetes Sister   . Thyroid disease Sister   . Diabetes Sister   . Thyroid disease Sister   . Thyroid disease Sister   . Heart disease Father   . Cancer Neg Hx   . Breast cancer Neg Hx     Social History   Socioeconomic History  . Marital status: Married    Spouse name: [name]  . Number of children: 2  . Years of education: Not on file  . Highest education level: High school graduate  Occupational History  .  Not on file  Social Needs  . Financial resource strain: Not very hard  . Food insecurity    Worry: Never true    Inability: Never true  . Transportation needs    Medical: No    Non-medical: No  Tobacco Use  . Smoking status: Never Smoker  . Smokeless tobacco: Never Used  Substance and Sexual Activity  . Alcohol use: No    Alcohol/week: 0.0 standard drinks  . Drug use: No  . Sexual activity: Yes    Partners: Male    Birth control/protection: Surgical  Lifestyle  . Physical activity    Days per week: 0 days    Minutes per session: 0 min  . Stress: To some extent  Relationships  . Social connections    Talks  on phone: More than three times a week    Gets together: More than three times a week    Attends religious service: More than 4 times per year    Active member of club or organization: No    Attends meetings of clubs or organizations: Never    Relationship status: Married  . Intimate partner violence    Fear of current or ex partner: No    Emotionally abused: No    Physically abused: No    Forced sexual activity: No  Other Topics Concern  . Not on file  Social History Narrative   Patient has a combined 3 children but she only birthed 2.     Current Outpatient Medications:  .  aspirin EC 81 MG tablet, Take 81 mg by mouth daily. , Disp: , Rfl:  .  atorvastatin (LIPITOR) 20 MG tablet, Take 1 tablet (20 mg total) by mouth daily., Disp: 90 tablet, Rfl: 1 .  estradiol (ESTRACE) 0.5 MG tablet, Take 1 tablet (0.5 mg total) by mouth daily., Disp: 30 tablet, Rfl: 6 .  famotidine (PEPCID) 40 MG tablet, Take 1 tablet (40 mg total) by mouth daily as needed for heartburn or indigestion., Disp: 90 tablet, Rfl: 1 .  glipiZIDE (GLUCOTROL XL) 2.5 MG 24 hr tablet, TAKE 1 TABLET BY MOUTH TWICE DAILY BEFORE A MEAL, Disp: 60 tablet, Rfl: 0 .  glucose blood (ONETOUCH VERIO) test strip, Use as instructed, Disp: 100 each, Rfl: 12 .  levothyroxine (SYNTHROID) 150 MCG tablet, Take 1 tablet (150 mcg total) by mouth daily., Disp: 90 tablet, Rfl: 1 .  metFORMIN (GLUCOPHAGE-XR) 750 MG 24 hr tablet, Take 2 tablets (1,500 mg total) by mouth daily with breakfast., Disp: 180 tablet, Rfl: 1 .  Multiple Vitamin (MULTIVITAMIN) capsule, Take 1 capsule by mouth daily., Disp: , Rfl:  .  telmisartan-hydrochlorothiazide (MICARDIS HCT) 80-25 MG tablet, Take 1 tablet by mouth daily., Disp: 90 tablet, Rfl: 1 .  fluticasone (FLONASE) 50 MCG/ACT nasal spray, Place 2 sprays into both nostrils daily. (Patient not taking: Reported on 11/17/2018), Disp: 16 g, Rfl: 0  No Known Allergies  I personally reviewed active problem list,  medication list, allergies, family history, social history, health maintenance with the patient/caregiver today.   ROS  Constitutional: Negative for fever or weight change.  Respiratory: Negative for cough and shortness of breath.   Cardiovascular: Negative for chest pain or palpitations.  Gastrointestinal: Negative for abdominal pain, no bowel changes.  Musculoskeletal: Negative for gait problem or joint swelling.  Skin: Negative for rash.  Neurological: Negative for dizziness or headache.  No other specific complaints in a complete review of systems (except as listed in HPI above).  Objective  Vitals:   01/12/19 1503  BP: 120/90  Pulse: 80  Resp: 16  Temp: (!) 97.1 F (36.2 C)  TempSrc: Temporal  SpO2: 98%  Weight: 170 lb 3.2 oz (77.2 kg)  Height: 5\' 1"  (1.549 m)    Body mass index is 32.16 kg/m.  Physical Exam  Constitutional: Patient appears well-developed and well-nourished. Obese  No distress.  HEENT: head atraumatic, normocephalic, pupils equal and reactive to light, Cardiovascular: Normal rate, regular rhythm and normal heart sounds.  No murmur heard. No BLE edema. Pulmonary/Chest: Effort normal and breath sounds normal. No respiratory distress. Muscular Skeletal: decrease rom of left shoulder Abdominal: Soft.  There is no tenderness. Psychiatric: Patient has a normal mood and affect. behavior is normal. Judgment and thought content normal.   PHQ2/9: Depression screen [facility] 2/9 11/11/2018 06/16/2018 02/13/2018 02/10/2018 10/14/2017  Decreased Interest 0 0 0 0 0  Down, Depressed, Hopeless 0 0 0 0 0  PHQ - 2 Score 0 0 0 0 0  Altered sleeping 0 - 0 0 -  Tired, decreased energy 0 - 0 0 -  Change in appetite 0 - 0 0 -  Feeling bad or failure about yourself  0 - 0 0 -  Trouble concentrating 0 - 0 0 -  Moving slowly or fidgety/restless 0 - 0 0 -  Suicidal thoughts 0 - 0 0 -  PHQ-9 Score 0 - 0 0 -  Difficult doing work/chores - - Not difficult at all - -    phq 9  is negative   Fall Risk: Fall Risk  06/16/2018 02/10/2018 10/14/2017 07/11/2017 01/27/2017  Falls in the past year? 0 No No No Yes  Number falls in past yr: 0 - - - 1  Injury with Fall? 0 - - - Yes      Assessment & Plan   1. Dyslipidemia associated with type 2 diabetes mellitus (HCC)  - POCT HgB A1C  2. Hypertension, benign  - COMPLETE METABOLIC PANEL WITH GFR - CBC with Differential/Platelet  3. Dyslipidemia  - Lipid panel  4. Acquired hypothyroidism  - TSH  5. Chronic left shoulder pain  Follow up with employee health

## 2019-01-13 LAB — CBC WITH DIFFERENTIAL/PLATELET
Absolute Monocytes: 396 cells/uL (ref 200–950)
Basophils Absolute: 29 cells/uL (ref 0–200)
Basophils Relative: 0.4 %
Eosinophils Absolute: 187 cells/uL (ref 15–500)
Eosinophils Relative: 2.6 %
HCT: 42 % (ref 35.0–45.0)
Hemoglobin: 13.6 g/dL (ref 11.7–15.5)
Lymphs Abs: 3017 cells/uL (ref 850–3900)
MCH: 25.6 pg — ABNORMAL LOW (ref 27.0–33.0)
MCHC: 32.4 g/dL (ref 32.0–36.0)
MCV: 79.1 fL — ABNORMAL LOW (ref 80.0–100.0)
MPV: 13 fL — ABNORMAL HIGH (ref 7.5–12.5)
Monocytes Relative: 5.5 %
Neutro Abs: 3571 cells/uL (ref 1500–7800)
Neutrophils Relative %: 49.6 %
Platelets: 265 10*3/uL (ref 140–400)
RBC: 5.31 10*6/uL — ABNORMAL HIGH (ref 3.80–5.10)
RDW: 15.1 % — ABNORMAL HIGH (ref 11.0–15.0)
Total Lymphocyte: 41.9 %
WBC: 7.2 10*3/uL (ref 3.8–10.8)

## 2019-01-13 LAB — COMPLETE METABOLIC PANEL WITH GFR
AG Ratio: 1.4 (calc) (ref 1.0–2.5)
ALT: 24 U/L (ref 6–29)
AST: 15 U/L (ref 10–35)
Albumin: 4.2 g/dL (ref 3.6–5.1)
Alkaline phosphatase (APISO): 88 U/L (ref 37–153)
BUN: 13 mg/dL (ref 7–25)
CO2: 28 mmol/L (ref 20–32)
Calcium: 10 mg/dL (ref 8.6–10.4)
Chloride: 99 mmol/L (ref 98–110)
Creat: 0.72 mg/dL (ref 0.50–1.05)
GFR, Est African American: 110 mL/min/{1.73_m2} (ref >=60)
GFR, Est Non African American: 95 mL/min/{1.73_m2} (ref >=60)
Globulin: 2.9 g/dL (calc) (ref 1.9–3.7)
Glucose, Bld: 82 mg/dL (ref 65–99)
Potassium: 4.1 mmol/L (ref 3.5–5.3)
Sodium: 139 mmol/L (ref 135–146)
Total Bilirubin: 0.3 mg/dL (ref 0.2–1.2)
Total Protein: 7.1 g/dL (ref 6.1–8.1)

## 2019-01-13 LAB — LIPID PANEL
Cholesterol: 142 mg/dL (ref <200)
HDL: 67 mg/dL (ref >=50)
LDL Cholesterol (Calc): 48 mg/dL (calc)
Non-HDL Cholesterol (Calc): 75 mg/dL (calc) (ref <130)
Total CHOL/HDL Ratio: 2.1 (calc) (ref <5.0)
Triglycerides: 200 mg/dL — ABNORMAL HIGH (ref <150)

## 2019-01-13 LAB — TSH: TSH: 0.8 mIU/L

## 2019-01-25 ENCOUNTER — Other Ambulatory Visit: Payer: Self-pay | Admitting: Family Medicine

## 2019-01-25 ENCOUNTER — Telehealth: Payer: Self-pay | Admitting: Family Medicine

## 2019-01-25 DIAGNOSIS — G8929 Other chronic pain: Secondary | ICD-10-CM

## 2019-01-25 NOTE — Telephone Encounter (Signed)
Valerie Manning is on her way over, sent by her Supervisor for letter stating she should remain on light duty.

## 2019-01-25 NOTE — Telephone Encounter (Signed)
Copied from [place] [id number]. Topic: General - Other >> Jan 25, 2019  8:43 AM Keene Breath wrote: Reason for CRM: Patient called to request a referral for an ortho specialist for her shoulder.  Please advise and let patient know when the referral has been approved.  CB# [phone] >> Jan 25, 2019  4:29 PM Percell Belt A wrote: Pt called and has an appt with [facility] thus the 1st 8:30

## 2019-01-25 NOTE — Telephone Encounter (Signed)
Referral has been sent to [facility]. 

## 2019-01-25 NOTE — Telephone Encounter (Signed)
Copied from [place] [id number]. Topic: General - Other >> Jan 25, 2019  8:43 AM Keene Breath wrote: Reason for CRM: Patient called to request a referral for an ortho specialist for her shoulder.  Please advise and let patient know when the referral has been approved.  CB# [phone]

## 2019-01-26 NOTE — Telephone Encounter (Signed)
Patient has been notified

## 2019-01-28 DIAGNOSIS — M25512 Pain in left shoulder: Secondary | ICD-10-CM | POA: Diagnosis not present

## 2019-02-02 DIAGNOSIS — M25612 Stiffness of left shoulder, not elsewhere classified: Secondary | ICD-10-CM | POA: Diagnosis not present

## 2019-02-02 DIAGNOSIS — M25512 Pain in left shoulder: Secondary | ICD-10-CM | POA: Diagnosis not present

## 2019-02-04 ENCOUNTER — Other Ambulatory Visit: Payer: Self-pay | Admitting: Obstetrics and Gynecology

## 2019-02-09 DIAGNOSIS — M25512 Pain in left shoulder: Secondary | ICD-10-CM | POA: Diagnosis not present

## 2019-02-09 DIAGNOSIS — M25612 Stiffness of left shoulder, not elsewhere classified: Secondary | ICD-10-CM | POA: Diagnosis not present

## 2019-02-12 DIAGNOSIS — M25612 Stiffness of left shoulder, not elsewhere classified: Secondary | ICD-10-CM | POA: Diagnosis not present

## 2019-02-12 DIAGNOSIS — M25512 Pain in left shoulder: Secondary | ICD-10-CM | POA: Diagnosis not present

## 2019-02-16 DIAGNOSIS — M25612 Stiffness of left shoulder, not elsewhere classified: Secondary | ICD-10-CM | POA: Diagnosis not present

## 2019-02-16 DIAGNOSIS — M25512 Pain in left shoulder: Secondary | ICD-10-CM | POA: Diagnosis not present

## 2019-02-19 DIAGNOSIS — M25612 Stiffness of left shoulder, not elsewhere classified: Secondary | ICD-10-CM | POA: Diagnosis not present

## 2019-02-19 DIAGNOSIS — M25512 Pain in left shoulder: Secondary | ICD-10-CM | POA: Diagnosis not present

## 2019-02-23 DIAGNOSIS — M25612 Stiffness of left shoulder, not elsewhere classified: Secondary | ICD-10-CM | POA: Diagnosis not present

## 2019-02-23 DIAGNOSIS — M25512 Pain in left shoulder: Secondary | ICD-10-CM | POA: Diagnosis not present

## 2019-02-26 DIAGNOSIS — M25512 Pain in left shoulder: Secondary | ICD-10-CM | POA: Diagnosis not present

## 2019-02-26 DIAGNOSIS — M25612 Stiffness of left shoulder, not elsewhere classified: Secondary | ICD-10-CM | POA: Diagnosis not present

## 2019-03-02 DIAGNOSIS — M25512 Pain in left shoulder: Secondary | ICD-10-CM | POA: Diagnosis not present

## 2019-03-02 DIAGNOSIS — M25612 Stiffness of left shoulder, not elsewhere classified: Secondary | ICD-10-CM | POA: Diagnosis not present

## 2019-03-04 LAB — HM DIABETES EYE EXAM

## 2019-03-05 DIAGNOSIS — M25512 Pain in left shoulder: Secondary | ICD-10-CM | POA: Diagnosis not present

## 2019-03-05 DIAGNOSIS — M25612 Stiffness of left shoulder, not elsewhere classified: Secondary | ICD-10-CM | POA: Diagnosis not present

## 2019-03-09 DIAGNOSIS — M25512 Pain in left shoulder: Secondary | ICD-10-CM | POA: Diagnosis not present

## 2019-03-11 DIAGNOSIS — D179 Benign lipomatous neoplasm, unspecified: Secondary | ICD-10-CM | POA: Diagnosis not present

## 2019-03-11 DIAGNOSIS — D170 Benign lipomatous neoplasm of skin and subcutaneous tissue of head, face and neck: Secondary | ICD-10-CM | POA: Diagnosis not present

## 2019-03-29 ENCOUNTER — Other Ambulatory Visit: Payer: Self-pay | Admitting: Family Medicine

## 2019-03-29 DIAGNOSIS — E1169 Type 2 diabetes mellitus with other specified complication: Secondary | ICD-10-CM

## 2019-03-29 DIAGNOSIS — N6489 Other specified disorders of breast: Secondary | ICD-10-CM

## 2019-04-16 ENCOUNTER — Telehealth: Payer: Self-pay

## 2019-04-16 NOTE — Telephone Encounter (Signed)
Copied from [place] [phone]. Topic: General - Inquiry >> Apr 16, 2019  3:15 PM Scherrie Gerlach wrote: Reason for CRM: pt states she is scheduled to get the covid vaccine 04/28/19, and wants to ask Dr Ancil Boozer if she thinks it I ok for her to get. Pt states she wants to get it, but wants to make sure Dr Ancil Boozer thinks it is OK

## 2019-04-20 DIAGNOSIS — M25512 Pain in left shoulder: Secondary | ICD-10-CM | POA: Diagnosis not present

## 2019-04-20 NOTE — Telephone Encounter (Signed)
Patient called.  Patient aware.  

## 2019-04-23 ENCOUNTER — Other Ambulatory Visit: Payer: Self-pay

## 2019-04-23 ENCOUNTER — Emergency Department: Payer: Federal, State, Local not specified - PPO

## 2019-04-23 ENCOUNTER — Emergency Department
Admission: EM | Admit: 2019-04-23 | Discharge: 2019-04-24 | Disposition: A | Discharge status: Home / Self Care | Payer: Federal, State, Local not specified - PPO | Attending: Emergency Medicine | Admitting: Emergency Medicine

## 2019-04-23 DIAGNOSIS — Y92410 Unspecified street and highway as the place of occurrence of the external cause: Secondary | ICD-10-CM | POA: Insufficient documentation

## 2019-04-23 DIAGNOSIS — M25512 Pain in left shoulder: Secondary | ICD-10-CM | POA: Diagnosis not present

## 2019-04-23 DIAGNOSIS — E039 Hypothyroidism, unspecified: Secondary | ICD-10-CM | POA: Insufficient documentation

## 2019-04-23 DIAGNOSIS — E119 Type 2 diabetes mellitus without complications: Secondary | ICD-10-CM | POA: Diagnosis not present

## 2019-04-23 DIAGNOSIS — I10 Essential (primary) hypertension: Secondary | ICD-10-CM | POA: Diagnosis not present

## 2019-04-23 DIAGNOSIS — M791 Myalgia, unspecified site: Secondary | ICD-10-CM | POA: Diagnosis not present

## 2019-04-23 DIAGNOSIS — M7918 Myalgia, other site: Secondary | ICD-10-CM

## 2019-04-23 DIAGNOSIS — Y9389 Activity, other specified: Secondary | ICD-10-CM | POA: Diagnosis not present

## 2019-04-23 DIAGNOSIS — Z79899 Other long term (current) drug therapy: Secondary | ICD-10-CM | POA: Insufficient documentation

## 2019-04-23 DIAGNOSIS — Z7984 Long term (current) use of oral hypoglycemic drugs: Secondary | ICD-10-CM | POA: Insufficient documentation

## 2019-04-23 DIAGNOSIS — Z7982 Long term (current) use of aspirin: Secondary | ICD-10-CM | POA: Diagnosis not present

## 2019-04-23 DIAGNOSIS — Y999 Unspecified external cause status: Secondary | ICD-10-CM | POA: Diagnosis not present

## 2019-04-23 DIAGNOSIS — R52 Pain, unspecified: Secondary | ICD-10-CM | POA: Diagnosis not present

## 2019-04-23 DIAGNOSIS — S4992XA Unspecified injury of left shoulder and upper arm, initial encounter: Secondary | ICD-10-CM | POA: Diagnosis not present

## 2019-04-23 DIAGNOSIS — M25519 Pain in unspecified shoulder: Secondary | ICD-10-CM | POA: Diagnosis not present

## 2019-04-23 MED ORDER — HYDROCODONE-ACETAMINOPHEN 5-325 MG PO TABS
1.0000 | ORAL_TABLET | Freq: Once | ORAL | Status: AC
  Administered 2019-04-24: 1 via ORAL
  Filled 2019-04-23: qty 1
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
## 2019-04-24 DIAGNOSIS — M791 Myalgia, unspecified site: Secondary | ICD-10-CM | POA: Diagnosis not present

## 2019-04-24 DIAGNOSIS — M25512 Pain in left shoulder: Secondary | ICD-10-CM | POA: Diagnosis not present

## 2019-04-24 MED ORDER — NAPROXEN 500 MG PO TABS: 500.0000 mg | ORAL_TABLET | Freq: Two times a day (BID) | ORAL | 0 refills | Status: AC

## 2019-04-24 MED ORDER — CYCLOBENZAPRINE HCL 5 MG PO TABS: 5.0000 mg | ORAL_TABLET | Freq: Three times a day (TID) | ORAL | 0 refills | Status: DC | PRN

## 2019-04-24 NOTE — Discharge Instructions (Addendum)
Your exam and XR are negative for any fracture or dislocation. You can expect to be sore and stiff for a few days. Take the prescription meds as directed. Follow-up with Dr. Ancil Boozer as needed.

## 2019-04-27 ENCOUNTER — Other Ambulatory Visit: Payer: Federal, State, Local not specified - PPO

## 2019-05-03 DIAGNOSIS — M25512 Pain in left shoulder: Secondary | ICD-10-CM | POA: Diagnosis not present

## 2019-05-07 ENCOUNTER — Other Ambulatory Visit: Payer: Federal, State, Local not specified - PPO

## 2019-05-11 DIAGNOSIS — M7502 Adhesive capsulitis of left shoulder: Secondary | ICD-10-CM | POA: Diagnosis not present

## 2019-05-12 DIAGNOSIS — M7502 Adhesive capsulitis of left shoulder: Secondary | ICD-10-CM | POA: Insufficient documentation

## 2019-05-13 DIAGNOSIS — K080 Exfoliation of teeth due to systemic causes: Secondary | ICD-10-CM | POA: Diagnosis not present

## 2019-05-14 ENCOUNTER — Other Ambulatory Visit: Payer: Self-pay | Admitting: Family Medicine

## 2019-05-14 ENCOUNTER — Ambulatory Visit: Payer: Federal, State, Local not specified - PPO | Admitting: Family Medicine

## 2019-05-14 DIAGNOSIS — E039 Hypothyroidism, unspecified: Secondary | ICD-10-CM

## 2019-05-14 DIAGNOSIS — M25612 Stiffness of left shoulder, not elsewhere classified: Secondary | ICD-10-CM | POA: Diagnosis not present

## 2019-05-14 DIAGNOSIS — M25512 Pain in left shoulder: Secondary | ICD-10-CM | POA: Diagnosis not present

## 2019-05-17 ENCOUNTER — Ambulatory Visit
Admission: RE | Admit: 2019-05-17 | Discharge: 2019-05-17 | Disposition: A | Discharge status: Home / Self Care | Payer: Federal, State, Local not specified - PPO | Source: Ambulatory Visit | Attending: Family Medicine | Admitting: Family Medicine

## 2019-05-17 DIAGNOSIS — R928 Other abnormal and inconclusive findings on diagnostic imaging of breast: Secondary | ICD-10-CM | POA: Diagnosis not present

## 2019-05-17 DIAGNOSIS — N6489 Other specified disorders of breast: Secondary | ICD-10-CM | POA: Insufficient documentation

## 2019-05-18 DIAGNOSIS — S29012A Strain of muscle and tendon of back wall of thorax, initial encounter: Secondary | ICD-10-CM | POA: Diagnosis not present

## 2019-05-18 DIAGNOSIS — M25512 Pain in left shoulder: Secondary | ICD-10-CM | POA: Diagnosis not present

## 2019-05-18 DIAGNOSIS — M25612 Stiffness of left shoulder, not elsewhere classified: Secondary | ICD-10-CM | POA: Diagnosis not present

## 2019-05-18 DIAGNOSIS — S46812A Strain of other muscles, fascia and tendons at shoulder and upper arm level, left arm, initial encounter: Secondary | ICD-10-CM | POA: Insufficient documentation

## 2019-05-20 DIAGNOSIS — M25612 Stiffness of left shoulder, not elsewhere classified: Secondary | ICD-10-CM | POA: Diagnosis not present

## 2019-05-20 DIAGNOSIS — M25512 Pain in left shoulder: Secondary | ICD-10-CM | POA: Diagnosis not present

## 2019-05-24 DIAGNOSIS — M25612 Stiffness of left shoulder, not elsewhere classified: Secondary | ICD-10-CM | POA: Diagnosis not present

## 2019-05-24 DIAGNOSIS — M25512 Pain in left shoulder: Secondary | ICD-10-CM | POA: Diagnosis not present

## 2019-05-24 DIAGNOSIS — M545 Low back pain: Secondary | ICD-10-CM | POA: Diagnosis not present

## 2019-05-24 DIAGNOSIS — M542 Cervicalgia: Secondary | ICD-10-CM | POA: Diagnosis not present

## 2019-05-26 DIAGNOSIS — M545 Low back pain: Secondary | ICD-10-CM | POA: Diagnosis not present

## 2019-05-26 DIAGNOSIS — M25612 Stiffness of left shoulder, not elsewhere classified: Secondary | ICD-10-CM | POA: Diagnosis not present

## 2019-05-26 DIAGNOSIS — M542 Cervicalgia: Secondary | ICD-10-CM | POA: Diagnosis not present

## 2019-05-26 DIAGNOSIS — M25512 Pain in left shoulder: Secondary | ICD-10-CM | POA: Diagnosis not present

## 2019-05-28 ENCOUNTER — Encounter: Payer: Self-pay | Admitting: Family Medicine

## 2019-05-28 ENCOUNTER — Ambulatory Visit: Payer: Federal, State, Local not specified - PPO | Admitting: Family Medicine

## 2019-05-28 ENCOUNTER — Other Ambulatory Visit: Payer: Self-pay

## 2019-05-28 VITALS — BP 130/80 | HR 84 | Temp 97.1°F | Resp 16 | Ht 63.0 in | Wt 161.3 lb

## 2019-05-28 DIAGNOSIS — I10 Essential (primary) hypertension: Secondary | ICD-10-CM | POA: Diagnosis not present

## 2019-05-28 DIAGNOSIS — E785 Hyperlipidemia, unspecified: Secondary | ICD-10-CM

## 2019-05-28 DIAGNOSIS — E1169 Type 2 diabetes mellitus with other specified complication: Secondary | ICD-10-CM

## 2019-05-28 DIAGNOSIS — K2100 Gastro-esophageal reflux disease with esophagitis, without bleeding: Secondary | ICD-10-CM

## 2019-05-28 DIAGNOSIS — E039 Hypothyroidism, unspecified: Secondary | ICD-10-CM | POA: Diagnosis not present

## 2019-05-28 DIAGNOSIS — L700 Acne vulgaris: Secondary | ICD-10-CM

## 2019-05-28 LAB — POCT GLYCOSYLATED HEMOGLOBIN (HGB A1C): HbA1c, POC (controlled diabetic range): 6.5 % (ref 0.0–7.0)

## 2019-05-28 MED ORDER — LEVOTHYROXINE SODIUM 150 MCG PO TABS: 150.0000 ug | ORAL_TABLET | Freq: Every day | ORAL | 1 refills | Status: DC

## 2019-05-28 MED ORDER — TELMISARTAN-HCTZ 80-25 MG PO TABS: 1.0000 | ORAL_TABLET | Freq: Every day | ORAL | 1 refills | Status: DC

## 2019-05-28 MED ORDER — GLIPIZIDE ER 2.5 MG PO TB24: 2.5000 mg | ORAL_TABLET | Freq: Two times a day (BID) | ORAL | 0 refills | Status: DC

## 2019-05-28 MED ORDER — ATORVASTATIN CALCIUM 20 MG PO TABS: 20.0000 mg | ORAL_TABLET | Freq: Every day | ORAL | 1 refills | Status: DC

## 2019-05-28 MED ORDER — METFORMIN HCL ER 750 MG PO TB24: 1500.0000 mg | ORAL_TABLET | Freq: Every day | ORAL | 1 refills | Status: DC

## 2019-05-28 MED ORDER — CLINDAMYCIN PHOS-BENZOYL PEROX 1-5 % EX GEL: Freq: Two times a day (BID) | CUTANEOUS | 2 refills | Status: DC
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
## 2019-05-29 LAB — MICROALBUMIN / CREATININE URINE RATIO
Creatinine, Urine: 74 mg/dL (ref 20–275)
Microalb Creat Ratio: 4 mcg/mg creat (ref <30)
Microalb, Ur: 0.3 mg/dL

## 2019-06-01 DIAGNOSIS — M25612 Stiffness of left shoulder, not elsewhere classified: Secondary | ICD-10-CM | POA: Diagnosis not present

## 2019-06-01 DIAGNOSIS — M545 Low back pain: Secondary | ICD-10-CM | POA: Diagnosis not present

## 2019-06-01 DIAGNOSIS — M25512 Pain in left shoulder: Secondary | ICD-10-CM | POA: Diagnosis not present

## 2019-06-01 DIAGNOSIS — M542 Cervicalgia: Secondary | ICD-10-CM | POA: Diagnosis not present

## 2019-06-01 DIAGNOSIS — S29012D Strain of muscle and tendon of back wall of thorax, subsequent encounter: Secondary | ICD-10-CM | POA: Diagnosis not present

## 2019-06-02 DIAGNOSIS — S29012A Strain of muscle and tendon of back wall of thorax, initial encounter: Secondary | ICD-10-CM | POA: Insufficient documentation

## 2019-06-03 DIAGNOSIS — M25512 Pain in left shoulder: Secondary | ICD-10-CM | POA: Diagnosis not present

## 2019-06-03 DIAGNOSIS — M25612 Stiffness of left shoulder, not elsewhere classified: Secondary | ICD-10-CM | POA: Diagnosis not present

## 2019-06-03 DIAGNOSIS — M545 Low back pain: Secondary | ICD-10-CM | POA: Diagnosis not present

## 2019-06-03 DIAGNOSIS — M542 Cervicalgia: Secondary | ICD-10-CM | POA: Diagnosis not present

## 2019-06-09 DIAGNOSIS — M25612 Stiffness of left shoulder, not elsewhere classified: Secondary | ICD-10-CM | POA: Diagnosis not present

## 2019-06-09 DIAGNOSIS — M25512 Pain in left shoulder: Secondary | ICD-10-CM | POA: Diagnosis not present

## 2019-06-09 DIAGNOSIS — M545 Low back pain: Secondary | ICD-10-CM | POA: Diagnosis not present

## 2019-06-09 DIAGNOSIS — M542 Cervicalgia: Secondary | ICD-10-CM | POA: Diagnosis not present

## 2019-06-14 DIAGNOSIS — M545 Low back pain: Secondary | ICD-10-CM | POA: Diagnosis not present

## 2019-06-14 DIAGNOSIS — M25512 Pain in left shoulder: Secondary | ICD-10-CM | POA: Diagnosis not present

## 2019-06-14 DIAGNOSIS — M542 Cervicalgia: Secondary | ICD-10-CM | POA: Diagnosis not present

## 2019-06-14 DIAGNOSIS — M25612 Stiffness of left shoulder, not elsewhere classified: Secondary | ICD-10-CM | POA: Diagnosis not present

## 2019-06-16 DIAGNOSIS — M545 Low back pain: Secondary | ICD-10-CM | POA: Diagnosis not present

## 2019-06-16 DIAGNOSIS — M542 Cervicalgia: Secondary | ICD-10-CM | POA: Diagnosis not present

## 2019-06-16 DIAGNOSIS — M25512 Pain in left shoulder: Secondary | ICD-10-CM | POA: Diagnosis not present

## 2019-06-16 DIAGNOSIS — M25612 Stiffness of left shoulder, not elsewhere classified: Secondary | ICD-10-CM | POA: Diagnosis not present

## 2019-06-22 DIAGNOSIS — M25512 Pain in left shoulder: Secondary | ICD-10-CM | POA: Diagnosis not present

## 2019-08-03 DIAGNOSIS — M7542 Impingement syndrome of left shoulder: Secondary | ICD-10-CM | POA: Diagnosis not present

## 2019-08-31 DIAGNOSIS — M7542 Impingement syndrome of left shoulder: Secondary | ICD-10-CM | POA: Diagnosis not present

## 2019-09-01 ENCOUNTER — Ambulatory Visit: Payer: Federal, State, Local not specified - PPO | Attending: Orthopedic Surgery | Admitting: Physical Therapy

## 2019-09-01 ENCOUNTER — Encounter: Payer: Self-pay | Admitting: Physical Therapy

## 2019-09-01 ENCOUNTER — Other Ambulatory Visit: Payer: Self-pay

## 2019-09-01 DIAGNOSIS — M25512 Pain in left shoulder: Secondary | ICD-10-CM | POA: Insufficient documentation

## 2019-09-01 DIAGNOSIS — M6281 Muscle weakness (generalized): Secondary | ICD-10-CM | POA: Diagnosis not present
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
## 2019-09-02 NOTE — Addendum Note (Signed)
Addended by: Alanson Puls on: 09/02/2019 12:15 PM   Modules accepted: Orders

## 2019-09-07 ENCOUNTER — Ambulatory Visit: Payer: Federal, State, Local not specified - PPO

## 2019-09-07 ENCOUNTER — Other Ambulatory Visit: Payer: Self-pay

## 2019-09-07 DIAGNOSIS — M6281 Muscle weakness (generalized): Secondary | ICD-10-CM

## 2019-09-07 DIAGNOSIS — M25512 Pain in left shoulder: Secondary | ICD-10-CM | POA: Diagnosis not present
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
## 2019-09-09 ENCOUNTER — Other Ambulatory Visit: Payer: Self-pay

## 2019-09-09 ENCOUNTER — Ambulatory Visit: Payer: Federal, State, Local not specified - PPO

## 2019-09-09 DIAGNOSIS — M6281 Muscle weakness (generalized): Secondary | ICD-10-CM | POA: Diagnosis not present

## 2019-09-09 DIAGNOSIS — M25512 Pain in left shoulder: Secondary | ICD-10-CM
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
## 2019-09-14 ENCOUNTER — Other Ambulatory Visit: Payer: Self-pay

## 2019-09-14 ENCOUNTER — Ambulatory Visit: Payer: Federal, State, Local not specified - PPO

## 2019-09-14 DIAGNOSIS — M25512 Pain in left shoulder: Secondary | ICD-10-CM

## 2019-09-14 DIAGNOSIS — M6281 Muscle weakness (generalized): Secondary | ICD-10-CM | POA: Diagnosis not present
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
## 2019-09-16 ENCOUNTER — Ambulatory Visit: Payer: Federal, State, Local not specified - PPO

## 2019-09-16 ENCOUNTER — Other Ambulatory Visit: Payer: Self-pay

## 2019-09-16 DIAGNOSIS — M25512 Pain in left shoulder: Secondary | ICD-10-CM | POA: Diagnosis not present

## 2019-09-16 DIAGNOSIS — M6281 Muscle weakness (generalized): Secondary | ICD-10-CM | POA: Diagnosis not present
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
## 2019-09-21 ENCOUNTER — Other Ambulatory Visit: Payer: Self-pay

## 2019-09-21 ENCOUNTER — Ambulatory Visit: Payer: Federal, State, Local not specified - PPO

## 2019-09-21 DIAGNOSIS — M6281 Muscle weakness (generalized): Secondary | ICD-10-CM | POA: Diagnosis not present

## 2019-09-21 DIAGNOSIS — M25512 Pain in left shoulder: Secondary | ICD-10-CM
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
## 2019-09-23 ENCOUNTER — Other Ambulatory Visit: Payer: Self-pay

## 2019-09-23 ENCOUNTER — Ambulatory Visit: Payer: Federal, State, Local not specified - PPO

## 2019-09-23 DIAGNOSIS — M25512 Pain in left shoulder: Secondary | ICD-10-CM

## 2019-09-23 DIAGNOSIS — M6281 Muscle weakness (generalized): Secondary | ICD-10-CM | POA: Diagnosis not present
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
## 2019-09-28 DIAGNOSIS — M7542 Impingement syndrome of left shoulder: Secondary | ICD-10-CM | POA: Diagnosis not present

## 2019-09-30 ENCOUNTER — Ambulatory Visit: Payer: Federal, State, Local not specified - PPO | Attending: Orthopedic Surgery

## 2019-09-30 ENCOUNTER — Other Ambulatory Visit: Payer: Self-pay

## 2019-09-30 DIAGNOSIS — M25512 Pain in left shoulder: Secondary | ICD-10-CM

## 2019-09-30 DIAGNOSIS — M6281 Muscle weakness (generalized): Secondary | ICD-10-CM
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
## 2019-10-04 ENCOUNTER — Ambulatory Visit: Payer: Federal, State, Local not specified - PPO

## 2019-10-04 ENCOUNTER — Other Ambulatory Visit: Payer: Self-pay

## 2019-10-04 DIAGNOSIS — M6281 Muscle weakness (generalized): Secondary | ICD-10-CM

## 2019-10-04 DIAGNOSIS — M25512 Pain in left shoulder: Secondary | ICD-10-CM | POA: Diagnosis not present

## 2019-10-05 ENCOUNTER — Ambulatory Visit: Payer: Federal, State, Local not specified - PPO | Admitting: Family Medicine

## 2019-10-05 ENCOUNTER — Encounter: Payer: Self-pay | Admitting: Family Medicine

## 2019-10-05 ENCOUNTER — Other Ambulatory Visit: Payer: Self-pay

## 2019-10-05 VITALS — BP 132/84 | HR 89 | Temp 97.9°F | Resp 16 | Ht 63.0 in | Wt 166.8 lb

## 2019-10-05 DIAGNOSIS — E785 Hyperlipidemia, unspecified: Secondary | ICD-10-CM

## 2019-10-05 DIAGNOSIS — K2100 Gastro-esophageal reflux disease with esophagitis, without bleeding: Secondary | ICD-10-CM

## 2019-10-05 DIAGNOSIS — I10 Essential (primary) hypertension: Secondary | ICD-10-CM

## 2019-10-05 DIAGNOSIS — E039 Hypothyroidism, unspecified: Secondary | ICD-10-CM

## 2019-10-05 DIAGNOSIS — E1159 Type 2 diabetes mellitus with other circulatory complications: Secondary | ICD-10-CM

## 2019-10-05 DIAGNOSIS — E1169 Type 2 diabetes mellitus with other specified complication: Secondary | ICD-10-CM

## 2019-10-05 LAB — POCT GLYCOSYLATED HEMOGLOBIN (HGB A1C): Hemoglobin A1C: 6.8 % — AB (ref 4.0–5.6)

## 2019-10-05 LAB — POCT UA - MICROALBUMIN: Microalbumin Ur, POC: 20 mg/L

## 2019-10-05 MED ORDER — GLIPIZIDE ER 5 MG PO TB24: 5.0000 mg | ORAL_TABLET | Freq: Every day | ORAL | 1 refills | Status: DC

## 2019-10-05 MED ORDER — LEVOTHYROXINE SODIUM 150 MCG PO TABS: 150.0000 ug | ORAL_TABLET | Freq: Every day | ORAL | 1 refills | Status: DC

## 2019-10-05 MED ORDER — METFORMIN HCL ER 750 MG PO TB24: 1500.0000 mg | ORAL_TABLET | Freq: Every day | ORAL | 1 refills | Status: DC

## 2019-10-05 MED ORDER — ATORVASTATIN CALCIUM 20 MG PO TABS: 20.0000 mg | ORAL_TABLET | Freq: Every day | ORAL | 1 refills | Status: DC

## 2019-10-05 MED ORDER — TELMISARTAN-HCTZ 80-25 MG PO TABS: 1.0000 | ORAL_TABLET | Freq: Every day | ORAL | 1 refills | Status: DC
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
## 2019-10-06 ENCOUNTER — Ambulatory Visit: Payer: Federal, State, Local not specified - PPO

## 2019-10-06 DIAGNOSIS — M6281 Muscle weakness (generalized): Secondary | ICD-10-CM

## 2019-10-06 DIAGNOSIS — M25512 Pain in left shoulder: Secondary | ICD-10-CM | POA: Diagnosis not present
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
## 2019-10-12 ENCOUNTER — Ambulatory Visit: Payer: Federal, State, Local not specified - PPO

## 2019-10-12 ENCOUNTER — Other Ambulatory Visit: Payer: Self-pay

## 2019-10-12 DIAGNOSIS — M6281 Muscle weakness (generalized): Secondary | ICD-10-CM

## 2019-10-12 DIAGNOSIS — M25512 Pain in left shoulder: Secondary | ICD-10-CM
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
## 2019-10-14 ENCOUNTER — Ambulatory Visit: Payer: Federal, State, Local not specified - PPO

## 2019-10-15 ENCOUNTER — Other Ambulatory Visit: Payer: Self-pay

## 2019-10-15 ENCOUNTER — Ambulatory Visit: Payer: Federal, State, Local not specified - PPO

## 2019-10-15 DIAGNOSIS — M25512 Pain in left shoulder: Secondary | ICD-10-CM | POA: Diagnosis not present

## 2019-10-15 DIAGNOSIS — M6281 Muscle weakness (generalized): Secondary | ICD-10-CM
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
## 2019-10-19 ENCOUNTER — Telehealth: Payer: Self-pay

## 2019-10-19 ENCOUNTER — Other Ambulatory Visit: Payer: Self-pay

## 2019-10-19 ENCOUNTER — Other Ambulatory Visit: Payer: Self-pay | Admitting: Family Medicine

## 2019-10-19 ENCOUNTER — Ambulatory Visit: Payer: Federal, State, Local not specified - PPO

## 2019-10-19 DIAGNOSIS — M25512 Pain in left shoulder: Secondary | ICD-10-CM | POA: Diagnosis not present

## 2019-10-19 DIAGNOSIS — R928 Other abnormal and inconclusive findings on diagnostic imaging of breast: Secondary | ICD-10-CM

## 2019-10-19 DIAGNOSIS — M6281 Muscle weakness (generalized): Secondary | ICD-10-CM

## 2019-10-19 NOTE — Telephone Encounter (Signed)
Copied from [place] [id number]. Topic: Referral - Status >> Oct 19, 2019 10:31 AM Scherrie Gerlach wrote: Reason for CRM: pt states se gets mammogram every 6 months and [facility] needs order for diagnostic mammogram.
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
## 2019-10-21 ENCOUNTER — Ambulatory Visit: Payer: Federal, State, Local not specified - PPO

## 2019-10-21 ENCOUNTER — Other Ambulatory Visit: Payer: Self-pay

## 2019-10-21 DIAGNOSIS — M6281 Muscle weakness (generalized): Secondary | ICD-10-CM | POA: Diagnosis not present

## 2019-10-21 DIAGNOSIS — M25512 Pain in left shoulder: Secondary | ICD-10-CM
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
## 2019-10-26 ENCOUNTER — Ambulatory Visit
Admission: RE | Admit: 2019-10-26 | Discharge: 2019-10-26 | Disposition: A | Discharge status: Home / Self Care | Payer: Federal, State, Local not specified - PPO | Source: Ambulatory Visit | Attending: Family Medicine | Admitting: Family Medicine

## 2019-10-26 ENCOUNTER — Other Ambulatory Visit: Payer: Self-pay

## 2019-10-26 ENCOUNTER — Ambulatory Visit: Payer: Federal, State, Local not specified - PPO

## 2019-10-26 DIAGNOSIS — R928 Other abnormal and inconclusive findings on diagnostic imaging of breast: Secondary | ICD-10-CM

## 2019-10-26 DIAGNOSIS — M25512 Pain in left shoulder: Secondary | ICD-10-CM

## 2019-10-26 DIAGNOSIS — R922 Inconclusive mammogram: Secondary | ICD-10-CM | POA: Diagnosis not present

## 2019-10-26 DIAGNOSIS — M6281 Muscle weakness (generalized): Secondary | ICD-10-CM | POA: Diagnosis not present
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
## 2019-10-28 ENCOUNTER — Ambulatory Visit: Payer: Federal, State, Local not specified - PPO

## 2019-11-03 ENCOUNTER — Other Ambulatory Visit: Payer: Self-pay

## 2019-11-03 ENCOUNTER — Ambulatory Visit: Payer: Federal, State, Local not specified - PPO | Attending: Orthopedic Surgery

## 2019-11-03 DIAGNOSIS — M25512 Pain in left shoulder: Secondary | ICD-10-CM | POA: Insufficient documentation

## 2019-11-03 DIAGNOSIS — M6281 Muscle weakness (generalized): Secondary | ICD-10-CM | POA: Diagnosis not present
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
## 2019-11-09 DIAGNOSIS — S29012A Strain of muscle and tendon of back wall of thorax, initial encounter: Secondary | ICD-10-CM | POA: Diagnosis not present

## 2019-11-10 ENCOUNTER — Other Ambulatory Visit: Payer: Self-pay

## 2019-11-10 ENCOUNTER — Ambulatory Visit: Payer: Federal, State, Local not specified - PPO

## 2019-11-10 DIAGNOSIS — M6281 Muscle weakness (generalized): Secondary | ICD-10-CM | POA: Diagnosis not present

## 2019-11-10 DIAGNOSIS — M25512 Pain in left shoulder: Secondary | ICD-10-CM | POA: Diagnosis not present
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
## 2019-11-17 ENCOUNTER — Other Ambulatory Visit: Payer: Self-pay

## 2019-11-17 ENCOUNTER — Ambulatory Visit: Payer: Federal, State, Local not specified - PPO

## 2019-11-17 DIAGNOSIS — M6281 Muscle weakness (generalized): Secondary | ICD-10-CM

## 2019-11-17 DIAGNOSIS — M25512 Pain in left shoulder: Secondary | ICD-10-CM | POA: Diagnosis not present
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
## 2019-11-18 ENCOUNTER — Encounter: Payer: Federal, State, Local not specified - PPO | Admitting: Obstetrics and Gynecology

## 2019-11-25 ENCOUNTER — Other Ambulatory Visit: Payer: Self-pay

## 2019-11-25 ENCOUNTER — Ambulatory Visit: Payer: Federal, State, Local not specified - PPO

## 2019-11-25 DIAGNOSIS — M25512 Pain in left shoulder: Secondary | ICD-10-CM | POA: Diagnosis not present

## 2019-11-25 DIAGNOSIS — M6281 Muscle weakness (generalized): Secondary | ICD-10-CM | POA: Diagnosis not present
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
## 2019-11-30 ENCOUNTER — Other Ambulatory Visit: Payer: Self-pay | Admitting: Family Medicine

## 2019-11-30 DIAGNOSIS — E785 Hyperlipidemia, unspecified: Secondary | ICD-10-CM

## 2019-11-30 DIAGNOSIS — E1169 Type 2 diabetes mellitus with other specified complication: Secondary | ICD-10-CM

## 2019-11-30 DIAGNOSIS — I10 Essential (primary) hypertension: Secondary | ICD-10-CM

## 2019-11-30 DIAGNOSIS — E1159 Type 2 diabetes mellitus with other circulatory complications: Secondary | ICD-10-CM

## 2019-12-01 ENCOUNTER — Other Ambulatory Visit: Payer: Self-pay

## 2019-12-01 ENCOUNTER — Ambulatory Visit: Payer: Federal, State, Local not specified - PPO | Attending: Orthopedic Surgery

## 2019-12-01 DIAGNOSIS — M6281 Muscle weakness (generalized): Secondary | ICD-10-CM

## 2019-12-01 DIAGNOSIS — M25512 Pain in left shoulder: Secondary | ICD-10-CM | POA: Insufficient documentation
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
## 2019-12-08 ENCOUNTER — Other Ambulatory Visit: Payer: Self-pay

## 2019-12-08 ENCOUNTER — Ambulatory Visit: Payer: Federal, State, Local not specified - PPO

## 2019-12-08 DIAGNOSIS — M6281 Muscle weakness (generalized): Secondary | ICD-10-CM

## 2019-12-08 DIAGNOSIS — M25512 Pain in left shoulder: Secondary | ICD-10-CM | POA: Diagnosis not present
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
## 2019-12-15 ENCOUNTER — Ambulatory Visit: Payer: Federal, State, Local not specified - PPO

## 2019-12-17 DIAGNOSIS — M25612 Stiffness of left shoulder, not elsewhere classified: Secondary | ICD-10-CM | POA: Diagnosis not present

## 2019-12-17 DIAGNOSIS — M25512 Pain in left shoulder: Secondary | ICD-10-CM | POA: Diagnosis not present

## 2019-12-17 DIAGNOSIS — S29012A Strain of muscle and tendon of back wall of thorax, initial encounter: Secondary | ICD-10-CM | POA: Diagnosis not present

## 2019-12-17 DIAGNOSIS — M7502 Adhesive capsulitis of left shoulder: Secondary | ICD-10-CM | POA: Diagnosis not present

## 2020-01-17 DIAGNOSIS — Z20822 Contact with and (suspected) exposure to covid-19: Secondary | ICD-10-CM | POA: Diagnosis not present

## 2020-01-25 ENCOUNTER — Encounter: Payer: Federal, State, Local not specified - PPO | Admitting: Family Medicine

## 2020-01-29 ENCOUNTER — Other Ambulatory Visit: Payer: Self-pay | Admitting: Obstetrics and Gynecology

## 2020-01-29 NOTE — Telephone Encounter (Signed)
Patient needs an annual exam prior to next refill.  She canceled her appt in July.

## 2020-02-02 ENCOUNTER — Telehealth: Payer: Self-pay

## 2020-02-02 NOTE — Telephone Encounter (Signed)
Called patient to inform her that her provider had given her a 90 day supply of her Rx she had requested to be refilled, in the middle of explaining to the patient that she would need a physical before any additional refills would be given- the call was disconnected. Tried to call patient back, was unable to reach patient. Appointment has been scheduled, patients MyChart is active so she should receive the notification regarding this appointment.

## 2020-02-02 NOTE — Telephone Encounter (Signed)
Will you please contact the pt for an annual exam. Dr. Marcelline Mates gave her 90 days supply (3 Months) worth of medication. Thanks [employer]

## 2020-02-03 NOTE — Telephone Encounter (Signed)
Hello Tab, Have you heard anything back concerning this pt. Did she receive your message and appt date? Thanks [employer]

## 2020-02-04 NOTE — Progress Notes (Signed)
Name: Valerie Manning   MRN: [id number]    DOB: 03/13/1966   Date:02/07/2020       Progress Note  Subjective  Chief Complaint  Chief Complaint  Patient presents with  . Follow-up  . Diabetes  . Hypertension  . Medication Refill    HPI  HTN: bp is at goal , no chest painor palpitation, compliant with medication and no side effects of medication .  Hypothyroidism: history of goiter treated with iodine, sheused to seeDr. Rosario Jacks, taking Synthroid 150 mcg daily and is due for repeat TSH today .Denies dysphagia, dry skin or change in bowel movements   DMII:She is taking glipizide XL2.5 mgbefore breakfast and dinnerand Metformin 1500 mgA1Ctoday was at goalbut trending up, she was eating more sweets prior to last visit , we changed  to glipizide 5 mg XL in am and she continued same dose of  Metformin and today A1C is down to 5.9%, we will try going down again to 2.5 mg and save 5 mg dose for holidays and travelling, continue a healthier diet  Shedeniespolyphagia, polydipsia or polyuria. Eye exam is up to date.She has been taking atorvastatin for dyslipidemia.   Maskney: on her chin usually, she staes used benzaclin and it has resolved, doing well now , only using prn now   Hyperlipidemia: taking atorvastatin, deniesmyalgia,LDLwas at goal at 45, not sure if she was fasting, triglycerides was elevated.We will recheck labs today   GERD: only has epigastric pain about twice a month when she eats something with red sauce or spicy, she states no recent episodes, she states it has been a while since she took Tums    Patient Active Problem List   Diagnosis Date Noted  . Strain of thoracic back region 06/02/2019  . Strain of left trapezius muscle 05/18/2019  . Adhesive capsulitis of left shoulder 05/12/2019  . Elevated red blood cell count 02/18/2018  . 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 Status post abdominal hysterectomy and left salpingo-oophorectomy 10/25/2015  . Acquired  hypothyroidism 03/13/2015  . History of iron deficiency anemia 03/13/2015  . Hypertension 08/15/2014  . Dyslipidemia associated with type 2 diabetes mellitus ([place]) 05/31/2014  . GERD (gastroesophageal reflux disease) 05/31/2014  . Hyperlipidemia 05/31/2014    Past Surgical History:  Procedure Laterality Date  . ABDOMINAL HYSTERECTOMY    . 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 BREAST BIOPSY Right 09/13/2016   Procedure: BREAST BIOPSY WITH NEEDLE LOCALIZATION;  Surgeon: [name], MD;  Location: [facility] ORS;  Service: General;  Laterality: Right;  . BREAST EXCISIONAL BIOPSY Right 06/16/2016   lumpectomy complext sclerosing lesion, benign    Family History  Problem Relation Age of Onset  . Diabetes Mother   . Diabetes Sister   . Heart disease Sister   . Diabetes Sister   . Diabetes Sister   . Thyroid disease Sister   . Diabetes Sister   . Thyroid disease Sister   . Thyroid disease Sister   . Heart disease Father   . Cancer Neg Hx   . Breast cancer Neg Hx     Social History   Tobacco Use  . Smoking status: Never Smoker  . Smokeless tobacco: Never Used  Substance Use Topics  . Alcohol use: No    Alcohol/week: 0.0 standard drinks     Current Outpatient Medications:  .  aspirin EC 81 MG tablet, Take 81 mg by mouth daily. , Disp: ,  Rfl:  .  atorvastatin (LIPITOR) 20 MG tablet, Take 1 tablet (20 mg total) by mouth daily., Disp: 90 tablet, Rfl: 1 .  estradiol (ESTRACE) 0.5 MG tablet, TAKE 1 TABLET(0.5 MG) BY MOUTH DAILY, Disp: 90 tablet, Rfl: 0 .  glipiZIDE (GLUCOTROL XL) 5 MG 24 hr tablet, Take 1 tablet (5 mg total) by mouth daily with breakfast., Disp: 90 tablet, Rfl: 1 .  glucose blood (ONETOUCH VERIO) test strip, Use as instructed, Disp: 100 each, Rfl: 12 .  levothyroxine (SYNTHROID) 150 MCG tablet, Take 1 tablet (150 mcg total) by mouth daily before breakfast., Disp: 90 tablet, Rfl: 1 .  metFORMIN  (GLUCOPHAGE-XR) 750 MG 24 hr tablet, Take 2 tablets (1,500 mg total) by mouth daily with breakfast., Disp: 180 tablet, Rfl: 1 .  Multiple Vitamin (MULTIVITAMIN) capsule, Take 1 capsule by mouth daily., Disp: , Rfl:  .  telmisartan-hydrochlorothiazide (MICARDIS HCT) 80-25 MG tablet, Take 1 tablet by mouth daily., Disp: 90 tablet, Rfl: 1  No Known Allergies  I personally reviewed active problem list, medication list, allergies, family history, social history, health maintenance with the patient/caregiver today.   ROS  Constitutional: Negative for fever or weight change.  Respiratory: Negative for cough and shortness of breath.   Cardiovascular: Negative for chest pain or palpitations.  Gastrointestinal: Negative for abdominal pain, no bowel changes.  Musculoskeletal: Negative for gait problem or joint swelling.  Skin: Negative for rash.  Neurological: Negative for dizziness or headache.  No other specific complaints in a complete review of systems (except as listed in HPI above).  Objective  Vitals:   02/07/20 0904  BP: 128/80  Pulse: 85  Resp: 16  Temp: 98.1 F (36.7 C)  TempSrc: Oral  SpO2: 100%  Weight: 168 lb 6.4 oz (76.4 kg)  Height: 5\' 3"  (1.6 m)    Body mass index is 29.83 kg/m.  Physical Exam  Constitutional: Patient appears well-developed and well-nourished. Overweight. No distress.  HEENT: head atraumatic, normocephalic, pupils equal and reactive to light,  neck supple Cardiovascular: Normal rate, regular rhythm and normal heart sounds.  No murmur heard. No BLE edema. Pulmonary/Chest: Effort normal and breath sounds normal. No respiratory distress. Abdominal: Soft.  There is no tenderness. Psychiatric: Patient has a normal mood and affect. behavior is normal. Judgment and thought content normal.  Recent Results (from the past 2160 hour(s))  POCT HgB A1C     Status: Abnormal   Collection Time: 02/07/20  9:08 AM  Result Value Ref Range   Hemoglobin A1C 5.9 (A)  4.0 - 5.6 %   HbA1c POC (<> result, manual entry)     HbA1c, POC (prediabetic range)     HbA1c, POC (controlled diabetic range)       PHQ2/9: Depression screen [facility] 2/9 02/07/2020 10/05/2019 05/28/2019 11/11/2018 06/16/2018  Decreased Interest 0 0 0 0 0  Down, Depressed, Hopeless 0 0 0 0 0  PHQ - 2 Score 0 0 0 0 0  Altered sleeping - 3 0 0 -  Tired, decreased energy - 0 0 0 -  Change in appetite - 0 0 0 -  Feeling bad or failure about yourself  - 0 0 0 -  Trouble concentrating - 0 0 0 -  Moving slowly or fidgety/restless - 0 0 0 -  Suicidal thoughts - 0 0 0 -  PHQ-9 Score - 3 0 0 -  Difficult doing work/chores - Somewhat difficult Not difficult at all - -    phq 9 is negative  Fall Risk: Fall Risk  02/07/2020 10/05/2019 05/28/2019 05/28/2019 06/16/2018  Falls in the past year? 0 0 0 0 0  Number falls in past yr: 0 - 0 0 0  Injury with Fall? 0 - 0 0 0     Functional Status Survey: Is the patient deaf or have difficulty hearing?: No Does the patient have difficulty seeing, even when wearing glasses/contacts?: No Does the patient have difficulty concentrating, remembering, or making decisions?: No Does the patient have difficulty walking or climbing stairs?: No Does the patient have difficulty dressing or bathing?: No Does the patient have difficulty doing errands alone such as visiting a doctor's office or shopping?: No    Assessment & Plan  1. Hypertension associated with type 2 diabetes mellitus ([place])  - POCT HgB A1C - COMPLETE METABOLIC PANEL WITH GFR - CBC with Differential/Platelet  2. Dyslipidemia associated with type 2 diabetes mellitus ([facility])  - Lipid panel - glipiZIDE (GLUCOTROL XL) 2.5 MG 24 hr tablet; Take 1 tablet (2.5 mg total) by mouth daily with breakfast.  Dispense: 90 tablet; Refill: 1  3. Dyslipidemia  - Lipid panel  4. Gastroesophageal reflux disease with esophagitis without hemorrhage  Controlled   5. Hypertension, benign  At goal   6. Acquired  hypothyroidism  - TSH  7. Acne vulgaris   8. Need for hepatitis C screening test  Hepatitis C   9. History of iron deficiency anemia  - CBC with Differential/Platelet

## 2020-02-07 ENCOUNTER — Encounter: Payer: Self-pay | Admitting: Family Medicine

## 2020-02-07 ENCOUNTER — Other Ambulatory Visit: Payer: Self-pay

## 2020-02-07 ENCOUNTER — Ambulatory Visit (INDEPENDENT_AMBULATORY_CARE_PROVIDER_SITE_OTHER): Payer: Federal, State, Local not specified - PPO | Admitting: Family Medicine

## 2020-02-07 VITALS — BP 128/80 | HR 85 | Temp 98.1°F | Resp 16 | Ht 63.0 in | Wt 168.4 lb

## 2020-02-07 DIAGNOSIS — E039 Hypothyroidism, unspecified: Secondary | ICD-10-CM

## 2020-02-07 DIAGNOSIS — Z1159 Encounter for screening for other viral diseases: Secondary | ICD-10-CM

## 2020-02-07 DIAGNOSIS — E1159 Type 2 diabetes mellitus with other circulatory complications: Secondary | ICD-10-CM

## 2020-02-07 DIAGNOSIS — I152 Hypertension secondary to endocrine disorders: Secondary | ICD-10-CM | POA: Diagnosis not present

## 2020-02-07 DIAGNOSIS — K2100 Gastro-esophageal reflux disease with esophagitis, without bleeding: Secondary | ICD-10-CM | POA: Diagnosis not present

## 2020-02-07 DIAGNOSIS — E1169 Type 2 diabetes mellitus with other specified complication: Secondary | ICD-10-CM | POA: Diagnosis not present

## 2020-02-07 DIAGNOSIS — I10 Essential (primary) hypertension: Secondary | ICD-10-CM

## 2020-02-07 DIAGNOSIS — E785 Hyperlipidemia, unspecified: Secondary | ICD-10-CM

## 2020-02-07 DIAGNOSIS — Z862 Personal history of diseases of the blood and blood-forming organs and certain disorders involving the immune mechanism: Secondary | ICD-10-CM

## 2020-02-07 DIAGNOSIS — L700 Acne vulgaris: Secondary | ICD-10-CM

## 2020-02-07 LAB — POCT GLYCOSYLATED HEMOGLOBIN (HGB A1C): Hemoglobin A1C: 5.9 % — AB (ref 4.0–5.6)

## 2020-02-07 MED ORDER — GLIPIZIDE ER 2.5 MG PO TB24: 2.5000 mg | ORAL_TABLET | Freq: Every day | ORAL | 1 refills | Status: DC

## 2020-02-08 LAB — HEPATITIS C ANTIBODY
Hepatitis C Ab: NONREACTIVE
SIGNAL TO CUT-OFF: 0.01 (ref <1.00)

## 2020-02-08 LAB — COMPLETE METABOLIC PANEL WITH GFR
AG Ratio: 1.6 (calc) (ref 1.0–2.5)
ALT: 17 U/L (ref 6–29)
AST: 14 U/L (ref 10–35)
Albumin: 4.2 g/dL (ref 3.6–5.1)
Alkaline phosphatase (APISO): 70 U/L (ref 37–153)
BUN: 14 mg/dL (ref 7–25)
CO2: 33 mmol/L — ABNORMAL HIGH (ref 20–32)
Calcium: 9.7 mg/dL (ref 8.6–10.4)
Chloride: 99 mmol/L (ref 98–110)
Creat: 0.67 mg/dL (ref 0.50–1.05)
GFR, Est African American: 115 mL/min/{1.73_m2} (ref >=60)
GFR, Est Non African American: 99 mL/min/{1.73_m2} (ref >=60)
Globulin: 2.6 g/dL (calc) (ref 1.9–3.7)
Glucose, Bld: 148 mg/dL — ABNORMAL HIGH (ref 65–99)
Potassium: 3.8 mmol/L (ref 3.5–5.3)
Sodium: 139 mmol/L (ref 135–146)
Total Bilirubin: 0.5 mg/dL (ref 0.2–1.2)
Total Protein: 6.8 g/dL (ref 6.1–8.1)

## 2020-02-08 LAB — LIPID PANEL
Cholesterol: 133 mg/dL (ref <200)
HDL: 65 mg/dL (ref >=50)
LDL Cholesterol (Calc): 50 mg/dL (calc)
Non-HDL Cholesterol (Calc): 68 mg/dL (calc) (ref <130)
Total CHOL/HDL Ratio: 2 (calc) (ref <5.0)
Triglycerides: 92 mg/dL (ref <150)

## 2020-02-08 LAB — CBC WITH DIFFERENTIAL/PLATELET
Absolute Monocytes: 211 cells/uL (ref 200–950)
Basophils Absolute: 22 cells/uL (ref 0–200)
Basophils Relative: 0.5 %
Eosinophils Absolute: 79 cells/uL (ref 15–500)
Eosinophils Relative: 1.8 %
HCT: 42.2 % (ref 35.0–45.0)
Hemoglobin: 13.8 g/dL (ref 11.7–15.5)
Lymphs Abs: 1716 cells/uL (ref 850–3900)
MCH: 25.7 pg — ABNORMAL LOW (ref 27.0–33.0)
MCHC: 32.7 g/dL (ref 32.0–36.0)
MCV: 78.7 fL — ABNORMAL LOW (ref 80.0–100.0)
MPV: 12.3 fL (ref 7.5–12.5)
Monocytes Relative: 4.8 %
Neutro Abs: 2372 cells/uL (ref 1500–7800)
Neutrophils Relative %: 53.9 %
Platelets: 248 10*3/uL (ref 140–400)
RBC: 5.36 10*6/uL — ABNORMAL HIGH (ref 3.80–5.10)
RDW: 14.7 % (ref 11.0–15.0)
Total Lymphocyte: 39 %
WBC: 4.4 10*3/uL (ref 3.8–10.8)

## 2020-02-08 LAB — TSH: TSH: 0.58 mIU/L

## 2020-02-08 NOTE — Telephone Encounter (Signed)
Patient is aware of her appointment..

## 2020-02-11 ENCOUNTER — Telehealth: Payer: Self-pay

## 2020-02-11 NOTE — Telephone Encounter (Signed)
Copied from [place] [id number]. Topic: General - Inquiry >> Feb 10, 2020  5:03 PM Alease Frame wrote: Reason for CRM: Pt is inquiring about the booster shot and wanted to hear back from Dr Ancil Boozer . Please advise

## 2020-04-11 ENCOUNTER — Other Ambulatory Visit: Payer: Self-pay | Admitting: Family Medicine

## 2020-04-11 DIAGNOSIS — E039 Hypothyroidism, unspecified: Secondary | ICD-10-CM

## 2020-04-29 ENCOUNTER — Other Ambulatory Visit: Payer: Self-pay | Admitting: Obstetrics and Gynecology

## 2020-05-01 NOTE — Telephone Encounter (Signed)
Dr. Valentino Saxon, Pts last visit was in 2020 next visit 05/09/2020. Would you like to refill the medication. Please advise. Thanks [employer]

## 2020-05-01 NOTE — Telephone Encounter (Signed)
We can give her a 30 day supply until her visit.

## 2020-05-02 ENCOUNTER — Other Ambulatory Visit: Payer: Self-pay | Admitting: Family Medicine

## 2020-05-02 DIAGNOSIS — E1169 Type 2 diabetes mellitus with other specified complication: Secondary | ICD-10-CM

## 2020-05-02 DIAGNOSIS — E785 Hyperlipidemia, unspecified: Secondary | ICD-10-CM

## 2020-05-02 MED ORDER — ESTRADIOL 0.5 MG PO TABS: ORAL_TABLET | ORAL | 0 refills | Status: DC

## 2020-05-02 NOTE — Addendum Note (Signed)
Addended by: Silvano Bilis on: 05/02/2020 09:58 AM   Modules accepted: Orders

## 2020-05-05 ENCOUNTER — Encounter: Payer: Federal, State, Local not specified - PPO | Admitting: Obstetrics and Gynecology

## 2020-05-09 ENCOUNTER — Encounter: Payer: Federal, State, Local not specified - PPO | Admitting: Obstetrics and Gynecology

## 2020-05-22 NOTE — Progress Notes (Signed)
Name: Valerie Manning   MRN: [id number]    DOB: [birth year]   Date:05/23/2020       Progress Note  Subjective  Chief Complaint  Annual Exam   HPI  Patient presents for annual CPE and follow up  HTN: bp is at goal , no chest pain, palpitation or dizziness. She is  compliant with medication and no side effects of medication.  Hypothyroidism: history of goiter treated with iodine, sheused to seeDr. Rosario Jacks, taking Synthroid 150 mcg daily , TSH at goal Denies dysphagia, dry skin or change in bowel movements. Continue current dose   DMII:She is taking glipizide XL2.5 mg and Metformin 1500 mg, A1C it was  down to 5.9% but today is upt o 6.3 % , we will try going down again to 2.5 mg and save 5 mg dose for holidays and travelling, continue a healthier diet  Shedeniespolyphagia, polydipsia or polyuria. Eye exam is up to date.She has been taking atorvastatinfor dyslipidemia. Reviewed labs done last visit   Maskney: on her chin usually,she staes used benzaclin and it has resolved, doing well now, only using prn now . Still doing well   Hyperlipidemia: taking atorvastatin, deniesmyalgia,LDLwas at goal at 50, normal triglycerides.   GERD: symptoms under control, take Tums very seldom now    Diet: balanced diet , enough calcium intake  Exercise: continue regular physical activity   [place] Office Visit from 02/07/2020 in [facility]  AUDIT-C Score 0     Depression: Phq 9 is  negative Depression screen [facility] 2/9 05/23/2020 02/07/2020 10/05/2019 05/28/2019 11/11/2018  Decreased Interest 0 0 0 0 0  Down, Depressed, Hopeless 0 0 0 0 0  PHQ - 2 Score 0 0 0 0 0  Altered sleeping 0 - 3 0 0  Tired, decreased energy 0 - 0 0 0  Change in appetite 0 - 0 0 0  Feeling bad or failure about yourself  0 - 0 0 0  Trouble concentrating 0 - 0 0 0  Moving slowly or fidgety/restless 0 - 0 0 0  Suicidal thoughts 0 - 0 0 0  PHQ-9 Score 0 - 3 0 0  Difficult doing  work/chores - - Somewhat difficult Not difficult at all -   Hypertension: BP Readings from Last 3 Encounters:  05/23/20 126/72  02/07/20 128/80  10/05/19 132/84   Obesity: Wt Readings from Last 3 Encounters:  05/23/20 166 lb (75.3 kg)  02/07/20 168 lb 6.4 oz (76.4 kg)  10/05/19 166 lb 12.8 oz (75.7 kg)   BMI Readings from Last 3 Encounters:  05/23/20 29.41 kg/m  02/07/20 29.83 kg/m  10/05/19 29.55 kg/m     Vaccines:   Shingrix: 33-64 yo and ask insurance if covered when patient above 30 yo - she will think about it  Pneumonia: educated and discussed with patient. Flu:  Done 12/2019  Hep C Screening: 02/07/2020 STD testing and prevention (HIV/chl/gon/syphilis): not interested  Intimate partner violence: negative screen  Sexual History : sexually active with husband Menstrual History/LMP/Abnormal Bleeding: negative for post-menopausal bleeding, taking estradiol for hot flashes  Incontinence Symptoms: negative   Breast cancer:  - Last Mammogram: 10/26/2019 but due for repeat diagnostic  - BRCA gene screening: N/A  Osteoporosis: Discussed high calcium and vitamin D supplementation, weight bearing exercises  Cervical cancer screening: 11/07/2012  Skin cancer: Discussed monitoring for atypical lesions  Colorectal cancer: 09/13/2012 Lung cancer:   Low Dose CT Chest recommended if Age 67-80 years, 20 pack-year currently smoking  OR have quit w/in 15years. Patient does not qualify.   ECG: 09/09/2016  Advanced Care Planning: A voluntary discussion about advance care planning including the explanation and discussion of advance directives.  Discussed health care proxy and Living will, and the patient was able to identify a health care proxy as husband .    Lipids: Lab Results  Component Value Date   CHOL 133 02/07/2020   CHOL 142 01/12/2019   CHOL 146 02/13/2018   Lab Results  Component Value Date   HDL 65 02/07/2020   HDL 67 01/12/2019   HDL 63 02/13/2018   Lab  Results  Component Value Date   LDLCALC 50 02/07/2020   LDLCALC 48 01/12/2019   LDLCALC 67 02/13/2018   Lab Results  Component Value Date   TRIG 92 02/07/2020   TRIG 200 (H) 01/12/2019   TRIG 82 02/13/2018   Lab Results  Component Value Date   CHOLHDL 2.0 02/07/2020   CHOLHDL 2.1 01/12/2019   CHOLHDL 2.3 02/13/2018   No results found for: LDLDIRECT  Glucose: Glucose  Date Value Ref Range Status  05/19/2014 186 (H) 65 - 99 mg/dL Final  02/03/2014 216 (H) 65 - 99 mg/dL Final  09/15/2013 213 (H) 65 - 99 mg/dL Final   Glucose, Bld  Date Value Ref Range Status  02/07/2020 148 (H) 65 - 99 mg/dL Final    Comment:    .            Fasting reference interval . For someone without known diabetes, a glucose value >125 mg/dL indicates that they may have diabetes and this should be confirmed with a follow-up test. .   01/12/2019 82 65 - 99 mg/dL Final    Comment:    .            Fasting reference interval .   02/13/2018 121 (H) 70 - 99 mg/dL Final   Glucose-Capillary  Date Value Ref Range Status  09/13/2016 145 (H) 65 - 99 mg/dL Final    Patient Active Problem List   Diagnosis Date Noted  . Strain of thoracic back region 06/02/2019  . Strain of left trapezius muscle 05/18/2019  . Adhesive capsulitis of left shoulder 05/12/2019  . Elevated red blood cell count 02/18/2018  . 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 Status post abdominal hysterectomy and left salpingo-oophorectomy 10/25/2015  . Acquired hypothyroidism 03/13/2015  . History of iron deficiency anemia 03/13/2015  . Hypertension 08/15/2014  . Dyslipidemia associated with type 2 diabetes mellitus ([place]) 05/31/2014  . GERD (gastroesophageal reflux disease) 05/31/2014  . Hyperlipidemia 05/31/2014    Past Surgical History:  Procedure Laterality Date  . ABDOMINAL HYSTERECTOMY    . 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 BREAST BIOPSY Right 09/13/2016    Procedure: BREAST BIOPSY WITH NEEDLE LOCALIZATION;  Surgeon: [name], MD;  Location: [facility] ORS;  Service: General;  Laterality: Right;  . BREAST EXCISIONAL BIOPSY Right 06/16/2016   lumpectomy complext sclerosing lesion, benign    Family History  Problem Relation Age of Onset  . Diabetes Mother   . Diabetes Sister   . Heart disease Sister   . Diabetes Sister   . Diabetes Sister   . Thyroid disease Sister   . Diabetes Sister   . Thyroid disease Sister   . Thyroid disease Sister   . Heart disease Father   . Cancer Neg Hx   . Breast cancer Neg Hx  Social History   Socioeconomic History  . Marital status: Married    Spouse name: [name]  . Number of children: 2  . Years of education: Not on file  . Highest education level: High school graduate  Occupational History  . Not on file  Tobacco Use  . Smoking status: Never Smoker  . Smokeless tobacco: Never Used  Vaping Use  . Vaping Use: Never used  Substance and Sexual Activity  . Alcohol use: No    Alcohol/week: 0.0 standard drinks  . Drug use: No  . Sexual activity: Yes    Partners: Male    Birth control/protection: Surgical  Other Topics Concern  . Not on file  Social History Narrative   Patient has a combined 3 children but she only birthed 2.   Social Determinants of Health   Financial Resource Strain: Low Risk   . Difficulty of Paying Living Expenses: Not hard at all  Food Insecurity: No Food Insecurity  . Worried About Charity fundraiser in the Last Year: Never true  . Ran Out of Food in the Last Year: Never true  Transportation Needs: No Transportation Needs  . Lack of Transportation (Medical): No  . Lack of Transportation (Non-Medical): No  Physical Activity: Sufficiently Active  . Days of Exercise per Week: 5 days  . Minutes of Exercise per Session: 30 min  Stress: No Stress Concern Present  . Feeling of Stress : Not at all  Social Connections: Moderately Integrated  . Frequency of  Communication with Friends and Family: More than three times a week  . Frequency of Social Gatherings with Friends and Family: More than three times a week  . Attends Religious Services: More than 4 times per year  . Active Member of Clubs or Organizations: No  . Attends Archivist Meetings: Never  . Marital Status: Married  Human resources officer Violence: Not At Risk  . Fear of Current or Ex-Partner: No  . Emotionally Abused: No  . Physically Abused: No  . Sexually Abused: No     Current Outpatient Medications:  .  aspirin EC 81 MG tablet, Take 81 mg by mouth daily. , Disp: , Rfl:  .  atorvastatin (LIPITOR) 20 MG tablet, TAKE 1 TABLET(20 MG) BY MOUTH DAILY, Disp: 90 tablet, Rfl: 1 .  estradiol (ESTRACE) 0.5 MG tablet, TAKE 1 TABLET(0.5 MG) BY MOUTH DAILY, Disp: 30 tablet, Rfl: 0 .  glipiZIDE (GLUCOTROL XL) 2.5 MG 24 hr tablet, Take 1 tablet (2.5 mg total) by mouth daily with breakfast., Disp: 90 tablet, Rfl: 1 .  glucose blood (ONETOUCH VERIO) test strip, Use as instructed, Disp: 100 each, Rfl: 12 .  levothyroxine (SYNTHROID) 150 MCG tablet, TAKE 1 TABLET(150 MCG) BY MOUTH DAILY BEFORE BREAKFAST, Disp: 90 tablet, Rfl: 1 .  metFORMIN (GLUCOPHAGE-XR) 750 MG 24 hr tablet, Take 2 tablets (1,500 mg total) by mouth daily with breakfast., Disp: 180 tablet, Rfl: 1 .  Multiple Vitamin (MULTIVITAMIN) capsule, Take 1 capsule by mouth daily., Disp: , Rfl:  .  telmisartan-hydrochlorothiazide (MICARDIS HCT) 80-25 MG tablet, Take 1 tablet by mouth daily., Disp: 90 tablet, Rfl: 1  No Known Allergies   ROS  Constitutional: Negative for fever or weight change.  Respiratory: Negative for cough and shortness of breath.   Cardiovascular: Negative for chest pain or palpitations.  Gastrointestinal: Negative for abdominal pain, no bowel changes.  Musculoskeletal: Negative for gait problem or joint swelling.  Skin: Negative for rash.  Neurological: Negative for dizziness or headache.  No other  specific complaints in a complete review of systems (except as listed in HPI above).  Objective  Vitals:   05/23/20 1334  BP: 126/72  Pulse: 89  Resp: 16  Temp: 97.9 F (36.6 C)  TempSrc: Oral  SpO2: 98%  Weight: 166 lb (75.3 kg)  Height: 5' 3"  (1.6 m)    Body mass index is 29.41 kg/m.  Physical Exam  Constitutional: Patient appears well-developed and well-nourished.Overweight. No distress.  HENT: Head: Normocephalic and atraumatic. Ears: B TMs ok, no erythema or effusion; Nose: not done. Mouth/Throat: negative screen  Eyes: Conjunctivae and EOM are normal. Pupils are equal, round, and reactive to light. No scleral icterus.  Neck: Normal range of motion. Neck supple. No JVD present. No thyromegaly present.  Cardiovascular: Normal rate, regular rhythm and normal heart sounds.  No murmur heard. No BLE edema. Pulmonary/Chest: Effort normal and breath sounds normal. No respiratory distress. Abdominal: Soft. Bowel sounds are normal, no distension. There is no tenderness. no masses Breast: no lumps or masses, no nipple discharge or rashes FEMALE GENITALIA:  Not done  RECTAL: not done Musculoskeletal: Normal range of motion, no joint effusions. No gross deformities Neurological: he is alert and oriented to person, place, and time. No cranial nerve deficit. Coordination, balance, strength, speech and gait are normal.  Skin: Skin is warm and dry. No rash noted. No erythema.  Psychiatric: Patient has a normal mood and affect. behavior is normal. Judgment and thought content normal.  Recent Results (from the past 2160 hour(s))  POCT HgB A1C     Status: Abnormal   Collection Time: 05/23/20  1:35 PM  Result Value Ref Range   Hemoglobin A1C 6.3 (A) 4.0 - 5.6 %   HbA1c POC (<> result, manual entry)     HbA1c, POC (prediabetic range)     HbA1c, POC (controlled diabetic range)        Fall Risk: Fall Risk  05/23/2020 02/07/2020 10/05/2019 05/28/2019 05/28/2019  Falls in the past year? 0  0 0 0 0  Number falls in past yr: 0 0 - 0 0  Injury with Fall? 0 0 - 0 0     Functional Status Survey: Is the patient deaf or have difficulty hearing?: No Does the patient have difficulty seeing, even when wearing glasses/contacts?: No Does the patient have difficulty concentrating, remembering, or making decisions?: No Does the patient have difficulty walking or climbing stairs?: No Does the patient have difficulty dressing or bathing?: No Does the patient have difficulty doing errands alone such as visiting a doctor's office or shopping?: No   Assessment & Plan  1. Dyslipidemia associated with type 2 diabetes mellitus ([facility])  - POCT HgB A1C - glipiZIDE (GLUCOTROL XL) 2.5 MG 24 hr tablet; Take 1 tablet (2.5 mg total) by mouth daily with breakfast.  Dispense: 90 tablet; Refill: 1 - metFORMIN (GLUCOPHAGE-XR) 750 MG 24 hr tablet; Take 2 tablets (1,500 mg total) by mouth daily with breakfast.  Dispense: 180 tablet; Refill: 1  2. Well adult exam   3. Abnormal mammography  - MM Digital Diagnostic Bilat; Future - US BREAST LTD UNI LEFT INC AXILLA; Future  4. Need for shingles vaccine  She wants to hold off for now   5. Hypertension, benign  - telmisartan-hydrochlorothiazide (MICARDIS HCT) 80-25 MG tablet; Take 1 tablet by mouth daily.  Dispense: 90 tablet; Refill: 1  6. Acquired hypothyroidism   7. Dyslipidemia   8. Gastroesophageal reflux disease with esophagitis without hemorrhage   9.  Hypertension associated with type 2 diabetes mellitus ([facility])  - telmisartan-hydrochlorothiazide (MICARDIS HCT) 80-25 MG tablet; Take 1 tablet by mouth daily.  Dispense: 90 tablet; Refill: 1  -USPSTF grade A and B recommendations reviewed with patient; age-appropriate recommendations, preventive care, screening tests, etc discussed and encouraged; healthy living encouraged; see AVS for patient education given to patient -Discussed importance of 150 minutes of physical activity weekly, eat  two servings of fish weekly, eat one serving of tree nuts ( cashews, pistachios, pecans, almonds.Marland Kitchen) every other day, eat 6 servings of fruit/vegetables daily and drink plenty of water and avoid sweet beverages.

## 2020-05-23 ENCOUNTER — Encounter: Payer: Self-pay | Admitting: Family Medicine

## 2020-05-23 ENCOUNTER — Other Ambulatory Visit: Payer: Self-pay

## 2020-05-23 ENCOUNTER — Ambulatory Visit (INDEPENDENT_AMBULATORY_CARE_PROVIDER_SITE_OTHER): Payer: Federal, State, Local not specified - PPO | Admitting: Family Medicine

## 2020-05-23 VITALS — BP 126/72 | HR 89 | Temp 97.9°F | Resp 16 | Ht 63.0 in | Wt 166.0 lb

## 2020-05-23 DIAGNOSIS — E039 Hypothyroidism, unspecified: Secondary | ICD-10-CM

## 2020-05-23 DIAGNOSIS — K2100 Gastro-esophageal reflux disease with esophagitis, without bleeding: Secondary | ICD-10-CM

## 2020-05-23 DIAGNOSIS — E1169 Type 2 diabetes mellitus with other specified complication: Secondary | ICD-10-CM | POA: Diagnosis not present

## 2020-05-23 DIAGNOSIS — E1159 Type 2 diabetes mellitus with other circulatory complications: Secondary | ICD-10-CM

## 2020-05-23 DIAGNOSIS — Z23 Encounter for immunization: Secondary | ICD-10-CM

## 2020-05-23 DIAGNOSIS — Z0000 Encounter for general adult medical examination without abnormal findings: Secondary | ICD-10-CM

## 2020-05-23 DIAGNOSIS — I152 Hypertension secondary to endocrine disorders: Secondary | ICD-10-CM

## 2020-05-23 DIAGNOSIS — I10 Essential (primary) hypertension: Secondary | ICD-10-CM | POA: Diagnosis not present

## 2020-05-23 DIAGNOSIS — E785 Hyperlipidemia, unspecified: Secondary | ICD-10-CM | POA: Diagnosis not present

## 2020-05-23 DIAGNOSIS — R928 Other abnormal and inconclusive findings on diagnostic imaging of breast: Secondary | ICD-10-CM | POA: Diagnosis not present

## 2020-05-23 LAB — POCT GLYCOSYLATED HEMOGLOBIN (HGB A1C): Hemoglobin A1C: 6.3 % — AB (ref 4.0–5.6)

## 2020-05-23 MED ORDER — GLIPIZIDE ER 2.5 MG PO TB24: 2.5000 mg | ORAL_TABLET | Freq: Every day | ORAL | 1 refills | Status: DC

## 2020-05-23 MED ORDER — TELMISARTAN-HCTZ 80-25 MG PO TABS: 1.0000 | ORAL_TABLET | Freq: Every day | ORAL | 1 refills | Status: DC

## 2020-05-23 MED ORDER — METFORMIN HCL ER 750 MG PO TB24: 1500.0000 mg | ORAL_TABLET | Freq: Every day | ORAL | 1 refills | Status: DC

## 2020-05-23 NOTE — Patient Instructions (Signed)
Preventive Care 84-56 Years Old, Female Preventive care refers to lifestyle choices and visits with your health care provider that can promote health and wellness. This includes:  A yearly physical exam. This is also called an annual wellness visit.  Regular dental and eye exams.  Immunizations.  Screening for certain conditions.  Healthy lifestyle choices, such as: ? Eating a healthy diet. ? Getting regular exercise. ? Not using drugs or products that contain nicotine and tobacco. ? Limiting alcohol use. What can I expect for my preventive care visit? Physical exam Your health care provider will check your:  Height and weight. These may be used to calculate your BMI (body mass index). BMI is a measurement that tells if you are at a healthy weight.  Heart rate and blood pressure.  Body temperature.  Skin for abnormal spots. Counseling Your health care provider may ask you questions about your:  Past medical problems.  Family's medical history.  Alcohol, tobacco, and drug use.  Emotional well-being.  Home life and relationship well-being.  Sexual activity.  Diet, exercise, and sleep habits.  Work and work Statistician.  Access to firearms.  Method of birth control.  Menstrual cycle.  Pregnancy history. What immunizations do I need? Vaccines are usually given at various ages, according to a schedule. Your health care provider will recommend vaccines for you based on your age, medical history, and lifestyle or other factors, such as travel or where you work.   What tests do I need? Blood tests  Lipid and cholesterol levels. These may be checked every 5 years, or more often if you are over 3 years old.  Hepatitis C test.  Hepatitis B test. Screening  Lung cancer screening. You may have this screening every year starting at age 73 if you have a 30-pack-year history of smoking and currently smoke or have quit within the past 15 years.  Colorectal cancer  screening. ? All adults should have this screening starting at age 52 and continuing until age 17. ? Your health care provider may recommend screening at age 49 if you are at increased risk. ? You will have tests every 1-10 years, depending on your results and the type of screening test.  Diabetes screening. ? This is done by checking your blood sugar (glucose) after you have not eaten for a while (fasting). ? You may have this done every 1-3 years.  Mammogram. ? This may be done every 1-2 years. ? Talk with your health care provider about when you should start having regular mammograms. This may depend on whether you have a family history of breast cancer.  BRCA-related cancer screening. This may be done if you have a family history of breast, ovarian, tubal, or peritoneal cancers.  Pelvic exam and Pap test. ? This may be done every 3 years starting at age 10. ? Starting at age 11, this may be done every 5 years if you have a Pap test in combination with an HPV test. Other tests  STD (sexually transmitted disease) testing, if you are at risk.  Bone density scan. This is done to screen for osteoporosis. You may have this scan if you are at high risk for osteoporosis. Talk with your health care provider about your test results, treatment options, and if necessary, the need for more tests. Follow these instructions at home: Eating and drinking  Eat a diet that includes fresh fruits and vegetables, whole grains, lean protein, and low-fat dairy products.  Take vitamin and mineral supplements  as recommended by your health care provider.  Do not drink alcohol if: ? Your health care provider tells you not to drink. ? You are pregnant, may be pregnant, or are planning to become pregnant.  If you drink alcohol: ? Limit how much you have to 0-1 drink a day. ? Be aware of how much alcohol is in your drink. In the [place] one drink equals one 12 oz bottle of beer (355 mL), one 5 oz glass of  wine (148 mL), or one 1 oz glass of hard liquor (44 mL).   Lifestyle  Take daily care of your teeth and gums. Brush your teeth every morning and night with fluoride toothpaste. Floss one time each day.  Stay active. Exercise for at least 30 minutes 5 or more days each week.  Do not use any products that contain nicotine or tobacco, such as cigarettes, e-cigarettes, and chewing tobacco. If you need help quitting, ask your health care provider.  Do not use drugs.  If you are sexually active, practice safe sex. Use a condom or other form of protection to prevent STIs (sexually transmitted infections).  If you do not wish to become pregnant, use a form of birth control. If you plan to become pregnant, see your health care provider for a prepregnancy visit.  If told by your health care provider, take low-dose aspirin daily starting at age 50.  Find healthy ways to cope with stress, such as: ? Meditation, yoga, or listening to music. ? Journaling. ? Talking to a trusted person. ? Spending time with friends and family. Safety  Always wear your seat belt while driving or riding in a vehicle.  Do not drive: ? If you have been drinking alcohol. Do not ride with someone who has been drinking. ? When you are tired or distracted. ? While texting.  Wear a helmet and other protective equipment during sports activities.  If you have firearms in your house, make sure you follow all gun safety procedures. What's next?  Visit your health care provider once a year for an annual wellness visit.  Ask your health care provider how often you should have your eyes and teeth checked.  Stay up to date on all vaccines. This information is not intended to replace advice given to you by your health care provider. Make sure you discuss any questions you have with your health care provider. Document Revised: 01/18/2020 Document Reviewed: 12/25/2017 Elsevier Patient Education  2021 Elsevier Inc.  

## 2020-05-26 ENCOUNTER — Other Ambulatory Visit: Payer: Self-pay | Admitting: Family Medicine

## 2020-05-26 DIAGNOSIS — I152 Hypertension secondary to endocrine disorders: Secondary | ICD-10-CM

## 2020-05-26 DIAGNOSIS — I10 Essential (primary) hypertension: Secondary | ICD-10-CM

## 2020-05-26 DIAGNOSIS — E1159 Type 2 diabetes mellitus with other circulatory complications: Secondary | ICD-10-CM

## 2020-06-12 ENCOUNTER — Ambulatory Visit: Payer: Federal, State, Local not specified - PPO | Admitting: Family Medicine

## 2020-06-26 ENCOUNTER — Other Ambulatory Visit: Payer: Self-pay | Admitting: Family Medicine

## 2020-06-26 ENCOUNTER — Other Ambulatory Visit: Payer: Self-pay | Admitting: Obstetrics and Gynecology

## 2020-06-26 MED ORDER — ESTRADIOL 0.5 MG PO TABS: ORAL_TABLET | ORAL | 2 refills | Status: DC

## 2020-06-26 NOTE — Telephone Encounter (Signed)
Medication Refill - Medication: estradiol (ESTRACE) 0.5 MG tablet    Has the patient contacted their pharmacy? Yes.    (Agent: If yes, when and what did the pharmacy advise?)  Patient needed appointment, patient states she had her physical 05/23/2020,.   Preferred Pharmacy (with phone number or street name):  [id number] [name], [place] Phone:  [phone]  Fax:  [phone]       Agent: Please be advised that RX refills may take up to 3 business days. We ask that you follow-up with your pharmacy.

## 2020-06-26 NOTE — Telephone Encounter (Signed)
   Notes to clinic: medication was filled by a different provider  Review for continue use and refill  Patient has appointment 08/2020  Requested Prescriptions  Pending Prescriptions Disp Refills   estradiol (ESTRACE) 0.5 MG tablet 30 tablet 0    Sig: TAKE 1 TABLET(0.5 MG) BY MOUTH DAILY      OB/GYN:  Estrogens Passed - 06/26/2020  2:59 PM      Passed - Mammogram is up-to-date per Health Maintenance      Passed - Last BP in normal range    BP Readings from Last 1 Encounters:  05/23/20 126/72          Passed - Valid encounter within last 12 months    Recent Outpatient Visits           1 month ago Dyslipidemia associated with type 2 diabetes mellitus [facility])   [facility] [place], Drue Stager, MD   4 months ago Hypertension associated with type 2 diabetes mellitus [facility])   [facility] [place], Drue Stager, MD   8 months ago Dyslipidemia associated with type 2 diabetes mellitus [facility])   [facility] [place], Drue Stager, MD   1 year ago Dyslipidemia associated with type 2 diabetes mellitus [facility])   [facility] [place], Drue Stager, MD   1 year ago Dyslipidemia associated with type 2 diabetes mellitus [facility])   [facility] Steele Sizer, MD       Future Appointments             In 2 months Ancil Boozer, Drue Stager, MD [facility], [facility]

## 2020-09-11 ENCOUNTER — Telehealth: Payer: Self-pay | Admitting: Family Medicine

## 2020-09-11 NOTE — Telephone Encounter (Signed)
Order was placed on 05/23/2020, patient notified.

## 2020-09-11 NOTE — Telephone Encounter (Signed)
Patient called to request a referral for a mammogram.  Please advise and call patient to confirm.  CB# [phone]

## 2020-09-19 NOTE — Progress Notes (Signed)
Name: Valerie Manning   MRN: [id number]    DOB: 16-Sep-1965   Date:09/20/2020       Progress Note  Subjective  Chief Complaint  Follow up   HPI   HTN: bp is at goal , no chest pain, palpitation or dizziness. She is  compliant with medication and no side effects of medication.   Hypothyroidism: history of goiter treated with iodine, sheused to seeDr. Rosario Jacks, taking Synthroid 150 mcg daily , TSH at goal Denies dysphagia, dry skin or change in bowel movements. Continue current dose and recheck level today    DMII:She is taking glipizide XL2.5 mg and Metformin 1500 mg, mg, A1C is at goal at 6.4 %  Shedeniespolyphagia, polydipsia or polyuria..She has been taking atorvastatinfor dyslipidemia. Last LDL was at goal at 55 , no side effects of medication. She will have an eye exam soon   Maskney: on her chin usually,she staes used benzaclin and it has resolved, doing well now, and off medication at this time  Hyperlipidemia: taking atorvastatin, deniesmyalgia,LDLwas at goal at 50, normal triglycerides. Unchanged     Patient Active Problem List   Diagnosis Date Noted  . Strain of thoracic back region 06/02/2019  . Strain of left trapezius muscle 05/18/2019  . Adhesive capsulitis of left shoulder 05/12/2019  . Elevated red blood cell count 02/18/2018  . 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 Status post abdominal hysterectomy and left salpingo-oophorectomy 10/25/2015  . Acquired hypothyroidism 03/13/2015  . History of iron deficiency anemia 03/13/2015  . Hypertension 08/15/2014  . Dyslipidemia associated with type 2 diabetes mellitus ([place]) 05/31/2014  . GERD (gastroesophageal reflux disease) 05/31/2014  . Hyperlipidemia 05/31/2014    Past Surgical History:  Procedure Laterality Date  . ABDOMINAL HYSTERECTOMY    . 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 BREAST BIOPSY Right 09/13/2016   Procedure: BREAST BIOPSY  WITH NEEDLE LOCALIZATION;  Surgeon: [name], MD;  Location: [facility] ORS;  Service: General;  Laterality: Right;  . BREAST EXCISIONAL BIOPSY Right 06/16/2016   lumpectomy complext sclerosing lesion, benign    Family History  Problem Relation Age of Onset  . Diabetes Mother   . Diabetes Sister   . Heart disease Sister   . Diabetes Sister   . Diabetes Sister   . Thyroid disease Sister   . Diabetes Sister   . Thyroid disease Sister   . Thyroid disease Sister   . Heart disease Father   . Cancer Neg Hx   . Breast cancer Neg Hx     Social History   Tobacco Use  . Smoking status: Never Smoker  . Smokeless tobacco: Never Used  Substance Use Topics  . Alcohol use: No    Alcohol/week: 0.0 standard drinks     Current Outpatient Medications:  .  aspirin EC 81 MG tablet, Take 81 mg by mouth daily. , Disp: , Rfl:  .  atorvastatin (LIPITOR) 20 MG tablet, TAKE 1 TABLET(20 MG) BY MOUTH DAILY, Disp: 90 tablet, Rfl: 1 .  estradiol (ESTRACE) 0.5 MG tablet, TAKE 1 TABLET(0.5 MG) BY MOUTH DAILY, Disp: 30 tablet, Rfl: 2 .  glipiZIDE (GLUCOTROL XL) 2.5 MG 24 hr tablet, Take 1 tablet (2.5 mg total) by mouth daily with breakfast., Disp: 90 tablet, Rfl: 1 .  glucose blood (ONETOUCH VERIO) test strip, Use as instructed, Disp: 100 each, Rfl: 12 .  levothyroxine (SYNTHROID) 150 MCG tablet, TAKE 1 TABLET(150 MCG) BY MOUTH DAILY  BEFORE BREAKFAST, Disp: 90 tablet, Rfl: 1 .  metFORMIN (GLUCOPHAGE-XR) 750 MG 24 hr tablet, Take 2 tablets (1,500 mg total) by mouth daily with breakfast., Disp: 180 tablet, Rfl: 1 .  Multiple Vitamin (MULTIVITAMIN) capsule, Take 1 capsule by mouth daily., Disp: , Rfl:  .  telmisartan-hydrochlorothiazide (MICARDIS HCT) 80-25 MG tablet, Take 1 tablet by mouth daily., Disp: 90 tablet, Rfl: 1  No Known Allergies  I personally reviewed active problem list, medication list, allergies, family history, social history, health maintenance with the patient/caregiver  today.   ROS  Constitutional: Negative for fever or weight change.  Respiratory: Negative for cough and shortness of breath.   Cardiovascular: Negative for chest pain or palpitations.  Gastrointestinal: Negative for abdominal pain, no bowel changes.  Musculoskeletal: Negative for gait problem or joint swelling.  Skin: Negative for rash.  Neurological: Negative for dizziness or headache.  No other specific complaints in a complete review of systems (except as listed in HPI above).  Objective  Vitals:   09/20/20 1402  BP: 122/76  Pulse: 98  Resp: 16  Temp: 98.6 F (37 C)  TempSrc: Oral  SpO2: 99%  Weight: 167 lb 3.2 oz (75.8 kg)  Height: 5\' 3"  (1.6 m)    Body mass index is 29.62 kg/m.  Physical Exam  Constitutional: Patient appears well-developed and well-nourished. Overweight.  No distress.  HEENT: head atraumatic, normocephalic, pupils equal and reactive to light,  neck supple Cardiovascular: Normal rate, regular rhythm and normal heart sounds.  No murmur heard. No BLE edema. Pulmonary/Chest: Effort normal and breath sounds normal. No respiratory distress. Abdominal: Soft.  There is no tenderness. Psychiatric: Patient has a normal mood and affect. behavior is normal. Judgment and thought content normal.  Recent Results (from the past 2160 hour(s))  POCT HgB A1C     Status: Abnormal   Collection Time: 09/20/20  2:05 PM  Result Value Ref Range   Hemoglobin A1C 6.4 (A) 4.0 - 5.6 %   HbA1c POC (<> result, manual entry)     HbA1c, POC (prediabetic range)     HbA1c, POC (controlled diabetic range)       PHQ2/9: Depression screen [facility] 2/9 09/20/2020 05/23/2020 02/07/2020 10/05/2019 05/28/2019  Decreased Interest 0 0 0 0 0  Down, Depressed, Hopeless 0 0 0 0 0  PHQ - 2 Score 0 0 0 0 0  Altered sleeping - 0 - 3 0  Tired, decreased energy - 0 - 0 0  Change in appetite - 0 - 0 0  Feeling bad or failure about yourself  - 0 - 0 0  Trouble concentrating - 0 - 0 0  Moving  slowly or fidgety/restless - 0 - 0 0  Suicidal thoughts - 0 - 0 0  PHQ-9 Score - 0 - 3 0  Difficult doing work/chores - - - Somewhat difficult Not difficult at all  Some recent data might be hidden    phq 9 is negative   Fall Risk: Fall Risk  09/20/2020 05/23/2020 02/07/2020 10/05/2019 05/28/2019  Falls in the past year? 0 0 0 0 0  Number falls in past yr: 0 0 0 - 0  Injury with Fall? 0 0 0 - 0  Follow up Falls prevention discussed - - - -    Functional Status Survey: Is the patient deaf or have difficulty hearing?: No Does the patient have difficulty seeing, even when wearing glasses/contacts?: No Does the patient have difficulty concentrating, remembering, or making decisions?: No Does the patient  have difficulty walking or climbing stairs?: No Does the patient have difficulty dressing or bathing?: No Does the patient have difficulty doing errands alone such as visiting a doctor's office or shopping?: No    Assessment & Plan  1. Dyslipidemia associated with type 2 diabetes mellitus (HCC)  - POCT HgB A1C - atorvastatin (LIPITOR) 20 MG tablet; Take 1 tablet (20 mg total) by mouth daily.  Dispense: 90 tablet; Refill: 1 - glipiZIDE (GLUCOTROL XL) 2.5 MG 24 hr tablet; Take 1 tablet (2.5 mg total) by mouth daily with breakfast.  Dispense: 90 tablet; Refill: 1 - metFORMIN (GLUCOPHAGE-XR) 750 MG 24 hr tablet; Take 2 tablets (1,500 mg total) by mouth daily with breakfast.  Dispense: 180 tablet; Refill: 1  2. Dyslipidemia  - atorvastatin (LIPITOR) 20 MG tablet; Take 1 tablet (20 mg total) by mouth daily.  Dispense: 90 tablet; Refill: 1  3. Acquired hypothyroidism  - TSH  4. Hypertension, benign  - telmisartan-hydrochlorothiazide (MICARDIS HCT) 80-25 MG tablet; Take 1 tablet by mouth daily.  Dispense: 90 tablet; Refill: 1  5. Gastroesophageal reflux disease with esophagitis without hemorrhage   6. History of iron deficiency anemia  - CBC with Differential/Platelet - Iron, TIBC  and Ferritin Panel  7. Need for shingles vaccine  - Varicella-zoster vaccine IM  8. Need for Tdap vaccination  - Tdap vaccine greater than or equal to 7yo IM  9. Hypertension associated with type 2 diabetes mellitus (HCC)  - telmisartan-hydrochlorothiazide (MICARDIS HCT) 80-25 MG tablet; Take 1 tablet by mouth daily.  Dispense: 90 tablet; Refill: 1

## 2020-09-20 ENCOUNTER — Other Ambulatory Visit: Payer: Self-pay

## 2020-09-20 ENCOUNTER — Encounter: Payer: Self-pay | Admitting: Family Medicine

## 2020-09-20 ENCOUNTER — Ambulatory Visit (INDEPENDENT_AMBULATORY_CARE_PROVIDER_SITE_OTHER): Payer: Federal, State, Local not specified - PPO | Admitting: Family Medicine

## 2020-09-20 VITALS — BP 122/76 | HR 98 | Temp 98.6°F | Resp 16 | Ht 63.0 in | Wt 167.2 lb

## 2020-09-20 DIAGNOSIS — E039 Hypothyroidism, unspecified: Secondary | ICD-10-CM | POA: Diagnosis not present

## 2020-09-20 DIAGNOSIS — E785 Hyperlipidemia, unspecified: Secondary | ICD-10-CM | POA: Diagnosis not present

## 2020-09-20 DIAGNOSIS — Z23 Encounter for immunization: Secondary | ICD-10-CM | POA: Diagnosis not present

## 2020-09-20 DIAGNOSIS — I10 Essential (primary) hypertension: Secondary | ICD-10-CM

## 2020-09-20 DIAGNOSIS — I152 Hypertension secondary to endocrine disorders: Secondary | ICD-10-CM

## 2020-09-20 DIAGNOSIS — Z862 Personal history of diseases of the blood and blood-forming organs and certain disorders involving the immune mechanism: Secondary | ICD-10-CM

## 2020-09-20 DIAGNOSIS — E1169 Type 2 diabetes mellitus with other specified complication: Secondary | ICD-10-CM | POA: Diagnosis not present

## 2020-09-20 DIAGNOSIS — E1159 Type 2 diabetes mellitus with other circulatory complications: Secondary | ICD-10-CM

## 2020-09-20 DIAGNOSIS — K2100 Gastro-esophageal reflux disease with esophagitis, without bleeding: Secondary | ICD-10-CM

## 2020-09-20 LAB — POCT GLYCOSYLATED HEMOGLOBIN (HGB A1C): Hemoglobin A1C: 6.4 % — AB (ref 4.0–5.6)

## 2020-09-20 MED ORDER — METFORMIN HCL ER 750 MG PO TB24: 1500.0000 mg | ORAL_TABLET | Freq: Every day | ORAL | 1 refills | Status: DC

## 2020-09-20 MED ORDER — GLIPIZIDE ER 2.5 MG PO TB24: 2.5000 mg | ORAL_TABLET | Freq: Every day | ORAL | 1 refills | Status: DC

## 2020-09-20 MED ORDER — TELMISARTAN-HCTZ 80-25 MG PO TABS: 1.0000 | ORAL_TABLET | Freq: Every day | ORAL | 1 refills | Status: DC

## 2020-09-20 MED ORDER — ATORVASTATIN CALCIUM 20 MG PO TABS: 20.0000 mg | ORAL_TABLET | Freq: Every day | ORAL | 1 refills | Status: DC

## 2020-09-21 LAB — CBC WITH DIFFERENTIAL/PLATELET
Absolute Monocytes: 348 cells/uL (ref 200–950)
Basophils Absolute: 29 cells/uL (ref 0–200)
Basophils Relative: 0.5 %
Eosinophils Absolute: 122 cells/uL (ref 15–500)
Eosinophils Relative: 2.1 %
HCT: 41.3 % (ref 35.0–45.0)
Hemoglobin: 13.2 g/dL (ref 11.7–15.5)
Lymphs Abs: 2204 cells/uL (ref 850–3900)
MCH: 25.4 pg — ABNORMAL LOW (ref 27.0–33.0)
MCHC: 32 g/dL (ref 32.0–36.0)
MCV: 79.6 fL — ABNORMAL LOW (ref 80.0–100.0)
MPV: 12.8 fL — ABNORMAL HIGH (ref 7.5–12.5)
Monocytes Relative: 6 %
Neutro Abs: 3097 cells/uL (ref 1500–7800)
Neutrophils Relative %: 53.4 %
Platelets: 252 10*3/uL (ref 140–400)
RBC: 5.19 10*6/uL — ABNORMAL HIGH (ref 3.80–5.10)
RDW: 14.8 % (ref 11.0–15.0)
Total Lymphocyte: 38 %
WBC: 5.8 10*3/uL (ref 3.8–10.8)

## 2020-09-21 LAB — IRON,TIBC AND FERRITIN PANEL
%SAT: 14 % (calc) — ABNORMAL LOW (ref 16–45)
Ferritin: 70 ng/mL (ref 16–232)
Iron: 42 ug/dL — ABNORMAL LOW (ref 45–160)
TIBC: 292 mcg/dL (calc) (ref 250–450)

## 2020-09-21 LAB — TSH: TSH: 0.94 mIU/L (ref 0.40–4.50)

## 2020-09-25 ENCOUNTER — Other Ambulatory Visit: Payer: Self-pay | Admitting: Family Medicine

## 2020-10-05 DIAGNOSIS — H524 Presbyopia: Secondary | ICD-10-CM | POA: Diagnosis not present

## 2020-10-08 ENCOUNTER — Other Ambulatory Visit: Payer: Self-pay | Admitting: Family Medicine

## 2020-10-08 DIAGNOSIS — E039 Hypothyroidism, unspecified: Secondary | ICD-10-CM

## 2020-10-08 NOTE — Telephone Encounter (Signed)
Requested Prescriptions  Pending Prescriptions Disp Refills  . levothyroxine (SYNTHROID) 150 MCG tablet [Pharmacy Med Name: LEVOTHYROXINE 0.150MG  (150MCG) TAB] 90 tablet 1    Sig: TAKE 1 TABLET(150 MCG) BY MOUTH DAILY BEFORE BREAKFAST     Endocrinology:  Hypothyroid Agents Failed - 10/08/2020 12:31 PM      Failed - TSH needs to be rechecked within 3 months after an abnormal result. Refill until TSH is due.      Passed - TSH in normal range and within 360 days    TSH  Date Value Ref Range Status  09/20/2020 0.94 0.40 - 4.50 mIU/L Final         Passed - Valid encounter within last 12 months    Recent Outpatient Visits          2 weeks ago Dyslipidemia associated with type 2 diabetes mellitus [facility])   [facility], Drue Stager, MD   4 months ago Dyslipidemia associated with type 2 diabetes mellitus [facility])   [facility] [place], Drue Stager, MD   8 months ago Hypertension associated with type 2 diabetes mellitus [facility])   [facility] [place], Drue Stager, MD   1 year ago Dyslipidemia associated with type 2 diabetes mellitus [facility])   [facility] Steele Sizer, MD   1 year ago Dyslipidemia associated with type 2 diabetes mellitus [facility])   [facility] Steele Sizer, MD      Future Appointments            In 5 months Ancil Boozer, Drue Stager, MD [facility], [place]   In 7 months Steele Sizer, MD [facility], [facility]

## 2020-10-16 ENCOUNTER — Telehealth: Payer: Self-pay

## 2020-10-16 NOTE — Telephone Encounter (Signed)
Spoke with pt and she is doing virtual on 10-17-2020 at 9:40

## 2020-10-16 NOTE — Progress Notes (Signed)
Name: Valerie Manning   MRN: [id number]    DOB: 01-Dec-1965   Date:10/17/2020       Progress Note  Subjective  Chief Complaint  COVID Positive  I connected with  Evelisse Szalkowski Critcher on 10/17/20 at  9:40 AM EDT by telephone and verified that I am speaking with the correct person using two identifiers.  I discussed the limitations, risks, security and privacy concerns of performing an evaluation and management service by telephone and the availability of in person appointments. Staff also discussed with the patient that there may be a patient responsible charge related to this service. Patient agreed on having a virtual visit  Patient Location:at home Provider Location: [facility] Additional Individuals present: husband   HPI  COVID-19: symptoms started June 18 th, 2022 , initially had clear rhinorrhea, followed by sinus congestion, she got tested on June 19 th before she went to visit her baby grand-daughter and test was positive . She has been drinking lots of fluids and staying in her room. She is keeping a mask on to not exposure her husband and youngest daughter that lives with them. She denies cough, headache, sore throat ,wheezing or SOB. Rhinorrhea resolved. She feels 90 % back to normal , this morning had mild loose stools   She was advised by [facility] to contact her PCP, otherwise she would not have made the appointment   Advised to avoid seeing her grand-daughter for a total of 14 days    Patient Active Problem List   Diagnosis Date Noted   Strain of thoracic back region 06/02/2019   Strain of left trapezius muscle 05/18/2019   Adhesive capsulitis of left shoulder 05/12/2019   Elevated red blood cell count 02/18/2018   Lipoma of neck 02/27/2016   Status post abdominal hysterectomy and left salpingo-oophorectomy 10/25/2015   Acquired hypothyroidism 03/13/2015   History of iron deficiency anemia 03/13/2015   Hypertension 08/15/2014   Dyslipidemia associated with type 2 diabetes  mellitus ([place]) 05/31/2014   GERD (gastroesophageal reflux disease) 05/31/2014   Hyperlipidemia 05/31/2014    Past Surgical History:  Procedure Laterality Date   ABDOMINAL HYSTERECTOMY     BREAST BIOPSY Right 05/2015   Pathology revealed stromal fibrosis with intra atrophic benign x2 areas   BREAST BIOPSY Right 09/13/2016   Procedure: BREAST BIOPSY WITH NEEDLE LOCALIZATION;  Surgeon: [name], MD;  Location: [facility] ORS;  Service: General;  Laterality: Right;   BREAST EXCISIONAL BIOPSY Right 06/16/2016   lumpectomy complext sclerosing lesion, benign    Family History  Problem Relation Age of Onset   Diabetes Mother    Diabetes Sister    Heart disease Sister    Diabetes Sister    Diabetes Sister    Thyroid disease Sister    Diabetes Sister    Thyroid disease Sister    Thyroid disease Sister    Heart disease Father    Cancer Neg Hx    Breast cancer Neg Hx     Social History   Socioeconomic History   Marital status: Married    Spouse name: [name]   Number of children: 2   Years of education: Not on file   Highest education level: High school graduate  Occupational History   Not on file  Tobacco Use   Smoking status: Never   Smokeless tobacco: Never  Vaping Use   Vaping Use: Never used  Substance and Sexual Activity   Alcohol use: No    Alcohol/week: 0.0 standard  drinks   Drug use: No   Sexual activity: Yes    Partners: Male    Birth control/protection: Surgical  Other Topics Concern   Not on file  Social History Narrative   Patient has a combined 3 children but she only birthed 2.   Social Determinants of Health   Financial Resource Strain: Low Risk    Difficulty of Paying Living Expenses: Not hard at all  Food Insecurity: No Food Insecurity   Worried About Charity fundraiser in the Last Year: Never true   [place] in the Last Year: Never true  Transportation Needs: No Transportation Needs   Lack of Transportation (Medical): No   Lack of  Transportation (Non-Medical): No  Physical Activity: Sufficiently Active   Days of Exercise per Week: 5 days   Minutes of Exercise per Session: 30 min  Stress: No Stress Concern Present   Feeling of Stress : Not at all  Social Connections: Moderately Integrated   Frequency of Communication with Friends and Family: More than three times a week   Frequency of Social Gatherings with Friends and Family: More than three times a week   Attends Religious Services: More than 4 times per year   Active Member of [employer] or Organizations: No   Attends Archivist Meetings: Never   Marital Status: Married  Human resources officer Violence: Not At Risk   Fear of Current or Ex-Partner: No   Emotionally Abused: No   Physically Abused: No   Sexually Abused: No     Current Outpatient Medications:    aspirin EC 81 MG tablet, Take 81 mg by mouth daily. , Disp: , Rfl:    atorvastatin (LIPITOR) 20 MG tablet, Take 1 tablet (20 mg total) by mouth daily., Disp: 90 tablet, Rfl: 1   estradiol (ESTRACE) 0.5 MG tablet, TAKE 1 TABLET(0.5 MG) BY MOUTH DAILY, Disp: 30 tablet, Rfl: 2   glipiZIDE (GLUCOTROL XL) 2.5 MG 24 hr tablet, Take 1 tablet (2.5 mg total) by mouth daily with breakfast., Disp: 90 tablet, Rfl: 1   glucose blood (ONETOUCH VERIO) test strip, Use as instructed, Disp: 100 each, Rfl: 12   levothyroxine (SYNTHROID) 150 MCG tablet, TAKE 1 TABLET(150 MCG) BY MOUTH DAILY BEFORE BREAKFAST, Disp: 90 tablet, Rfl: 2   metFORMIN (GLUCOPHAGE-XR) 750 MG 24 hr tablet, Take 2 tablets (1,500 mg total) by mouth daily with breakfast., Disp: 180 tablet, Rfl: 1   Multiple Vitamin (MULTIVITAMIN) capsule, Take 1 capsule by mouth daily., Disp: , Rfl:    telmisartan-hydrochlorothiazide (MICARDIS HCT) 80-25 MG tablet, Take 1 tablet by mouth daily., Disp: 90 tablet, Rfl: 1  No Known Allergies  I personally reviewed active problem list, medication list, allergies, family history with the patient/caregiver  today.   ROS  Ten systems reviewed and is negative except as mentioned in HPI   Objective  Virtual encounter, vitals not obtained.  There is no height or weight on file to calculate BMI.  Physical Exam  Awake, alert and oriented, no distress  PHQ2/9: Depression screen [facility] 2/9 10/17/2020 09/20/2020 05/23/2020 02/07/2020 10/05/2019  Decreased Interest 0 0 0 0 0  Down, Depressed, Hopeless 0 0 0 0 0  PHQ - 2 Score 0 0 0 0 0  Altered sleeping - - 0 - 3  Tired, decreased energy - - 0 - 0  Change in appetite - - 0 - 0  Feeling bad or failure about yourself  - - 0 - 0  Trouble concentrating - -  0 - 0  Moving slowly or fidgety/restless - - 0 - 0  Suicidal thoughts - - 0 - 0  PHQ-9 Score - - 0 - 3  Difficult doing work/chores - - - - Somewhat difficult  Some recent data might be hidden   PHQ-2/9 Result is negative.    Fall Risk: Fall Risk  10/17/2020 09/20/2020 05/23/2020 02/07/2020 10/05/2019  Falls in the past year? 0 0 0 0 0  Number falls in past yr: 0 0 0 0 -  Injury with Fall? 0 0 0 0 -  Follow up - Falls prevention discussed - - -    Assessment & Plan  1. COVID-19  Discussed fluids, rest, monitor for other symptoms including SOB She is getting significantly better at day 4 and we will not start her on medications for now  2. Dyslipidemia associated with type 2 diabetes mellitus ([place])  She is high risk, but has been doing well, and we will not start oral medications or refer her for an infusion at this time,  Discussed quarantine   I discussed the assessment and treatment plan with the patient. The patient was provided an opportunity to ask questions and all were answered. The patient agreed with the plan and demonstrated an understanding of the instructions.   The patient was advised to call back or seek an in-person evaluation if the symptoms worsen or if the condition fails to improve as anticipated.  I provided 15  minutes of non-face-to-face time during this  encounter.  Loistine Chance, MD

## 2020-10-16 NOTE — Telephone Encounter (Signed)
Copied from [place] [id number]. Topic: General - Other >> Oct 16, 2020  8:38 AM Tessa Lerner A wrote: Reason for CRM: Patient tested positive for COVID 19, via at home test, yesterday 10/15/20  Patient shares that they are experiencing congestion as well as a runny nose   Patient would like be prescribed something to help with their discomfort   Please contact to advise if needed

## 2020-10-17 ENCOUNTER — Encounter: Payer: Self-pay | Admitting: Family Medicine

## 2020-10-17 ENCOUNTER — Telehealth (INDEPENDENT_AMBULATORY_CARE_PROVIDER_SITE_OTHER): Payer: Federal, State, Local not specified - PPO | Admitting: Family Medicine

## 2020-10-17 DIAGNOSIS — U071 COVID-19: Secondary | ICD-10-CM | POA: Diagnosis not present

## 2020-10-17 DIAGNOSIS — E785 Hyperlipidemia, unspecified: Secondary | ICD-10-CM | POA: Diagnosis not present

## 2020-10-17 DIAGNOSIS — E1169 Type 2 diabetes mellitus with other specified complication: Secondary | ICD-10-CM

## 2020-10-26 ENCOUNTER — Ambulatory Visit
Admission: RE | Admit: 2020-10-26 | Discharge: 2020-10-26 | Disposition: A | Discharge status: Home / Self Care | Payer: Federal, State, Local not specified - PPO | Source: Ambulatory Visit | Attending: Family Medicine | Admitting: Family Medicine

## 2020-10-26 ENCOUNTER — Other Ambulatory Visit: Payer: Self-pay

## 2020-10-26 DIAGNOSIS — R922 Inconclusive mammogram: Secondary | ICD-10-CM | POA: Diagnosis not present

## 2020-10-26 DIAGNOSIS — R928 Other abnormal and inconclusive findings on diagnostic imaging of breast: Secondary | ICD-10-CM

## 2020-10-26 DIAGNOSIS — N6489 Other specified disorders of breast: Secondary | ICD-10-CM | POA: Diagnosis not present

## 2020-10-27 ENCOUNTER — Other Ambulatory Visit: Payer: Federal, State, Local not specified - PPO

## 2020-11-15 ENCOUNTER — Ambulatory Visit (INDEPENDENT_AMBULATORY_CARE_PROVIDER_SITE_OTHER): Payer: Federal, State, Local not specified - PPO

## 2020-11-15 ENCOUNTER — Other Ambulatory Visit: Payer: Self-pay

## 2020-11-15 DIAGNOSIS — Z23 Encounter for immunization: Secondary | ICD-10-CM

## 2020-12-23 ENCOUNTER — Other Ambulatory Visit: Payer: Self-pay | Admitting: Family Medicine

## 2020-12-24 NOTE — Telephone Encounter (Signed)
Requested Prescriptions  Pending Prescriptions Disp Refills  . estradiol (ESTRACE) 0.5 MG tablet [Pharmacy Med Name: ESTRADIOL 0.'5MG'$  TABLETS] 30 tablet 2    Sig: TAKE 1 TABLET(0.5 MG) BY MOUTH DAILY     OB/GYN:  Estrogens Passed - 12/23/2020  9:46 PM      Passed - Mammogram is up-to-date per Health Maintenance      Passed - Last BP in normal range    BP Readings from Last 1 Encounters:  09/20/20 122/76         Passed - Valid encounter within last 12 months    Recent Outpatient Visits          2 months ago [facility] Steele Sizer, MD   3 months ago Dyslipidemia associated with type 2 diabetes mellitus [facility])   [facility] [place], Drue Stager, MD   7 months ago Dyslipidemia associated with type 2 diabetes mellitus [facility])   [facility] [place], Drue Stager, MD   10 months ago Hypertension associated with type 2 diabetes mellitus [facility])   [facility] [place], Drue Stager, MD   1 year ago Dyslipidemia associated with type 2 diabetes mellitus [facility])   [facility] Steele Sizer, MD      Future Appointments            In 3 months Ancil Boozer, Drue Stager, MD [facility], [place]   In 5 months Steele Sizer, MD [facility], [facility]

## 2021-02-20 ENCOUNTER — Ambulatory Visit: Payer: Self-pay

## 2021-02-20 NOTE — Telephone Encounter (Signed)
Pt was experiencing diarrhea and vomiting since last Tuesday -Wednesday morning / Pt states she has not thrown up since Friday but still has diarrhea / pt has been able to eat and drink / pt has an appt for tomorrow afternoon/ please advise if needed   Pt. Reports had diarrhea and vomiting last week after eating food from out. Diarrhea continues 1-2 times a day. No vomiting now. Concerned she had food poisoning. Has an appointment for tomorrow.  Answer Assessment - Initial Assessment Questions 1. DIARRHEA SEVERITY: "How bad is the diarrhea?" "How many more stools have you had in the past 24 hours than normal?"    - NO DIARRHEA (SCALE 0)   - MILD (SCALE 1-3): Few loose or mushy BMs; increase of 1-3 stools over normal daily number of stools; mild increase in ostomy output.   -  MODERATE (SCALE 4-7): Increase of 4-6 stools daily over normal; moderate increase in ostomy output. * SEVERE (SCALE 8-10; OR 'WORST POSSIBLE'): Increase of 7 or more stools daily over normal; moderate increase in ostomy output; incontinence.     1-2 2. ONSET: "When did the diarrhea begin?"      Last week 3. BM CONSISTENCY: "How loose or watery is the diarrhea?"      Loose 4. VOMITING: "Are you also vomiting?" If Yes, ask: "How many times in the past 24 hours?"      Not this week 5. ABDOMINAL PAIN: "Are you having any abdominal pain?" If Yes, ask: "What does it feel like?" (e.g., crampy, dull, intermittent, constant)      Cramping 6. ABDOMINAL PAIN SEVERITY: If present, ask: "How bad is the pain?"  (e.g., Scale 1-10; mild, moderate, or severe)   - MILD (1-3): doesn't interfere with normal activities, abdomen soft and not tender to touch    - MODERATE (4-7): interferes with normal activities or awakens from sleep, abdomen tender to touch    - SEVERE (8-10): excruciating pain, doubled over, unable to do any normal activities       Mild 7. ORAL INTAKE: If vomiting, "Have you been able to drink liquids?" "How much liquids  have you had in the past 24 hours?"     Yes 8. HYDRATION: "Any signs of dehydration?" (e.g., dry mouth [not just dry lips], too weak to stand, dizziness, new weight loss) "When did you last urinate?"     No 9. EXPOSURE: "Have you traveled to a foreign country recently?" "Have you been exposed to anyone with diarrhea?" "Could you have eaten any food that was spoiled?"     No 10. ANTIBIOTIC USE: "Are you taking antibiotics now or have you taken antibiotics in the past 2 months?"       No 11. OTHER SYMPTOMS: "Do you have any other symptoms?" (e.g., fever, blood in stool)       No 12. PREGNANCY: "Is there any chance you are pregnant?" "When was your last menstrual period?"       No  Protocols used: [facility]

## 2021-02-21 ENCOUNTER — Ambulatory Visit: Payer: Federal, State, Local not specified - PPO | Attending: Family Medicine | Admitting: Physical Therapy

## 2021-02-21 ENCOUNTER — Other Ambulatory Visit: Payer: Self-pay

## 2021-02-21 ENCOUNTER — Ambulatory Visit: Payer: Federal, State, Local not specified - PPO

## 2021-02-21 ENCOUNTER — Ambulatory Visit: Payer: Federal, State, Local not specified - PPO | Admitting: Family Medicine

## 2021-02-21 ENCOUNTER — Encounter: Payer: Self-pay | Admitting: Family Medicine

## 2021-02-21 VITALS — BP 128/76 | HR 91 | Temp 98.4°F | Resp 18 | Ht 63.0 in | Wt 162.0 lb

## 2021-02-21 DIAGNOSIS — R1084 Generalized abdominal pain: Secondary | ICD-10-CM | POA: Diagnosis not present

## 2021-02-21 DIAGNOSIS — M545 Low back pain, unspecified: Secondary | ICD-10-CM | POA: Insufficient documentation

## 2021-02-21 NOTE — Progress Notes (Signed)
    SUBJECTIVE:   CHIEF COMPLAINT / HPI:   ABDOMINAL ISSUES - ate at [place] last week, with abdominal cramping and vomiting/diarrhea afterwards Duration: a week Nature: cramping, none since the weekend Frequency: intermittent Alleviating factors: none Aggravating factors: none Treatments attempted: none Constipation: no Diarrhea: yes Episodes of diarrhea/day: 1-2 Mucous in the stool: no Heartburn: no Bloating:no Nausea: yes Vomiting: yes, Friday last episode Melena or hematochezia: no Rash: no Jaundice: no Fever: no Weight loss:  unsure   OBJECTIVE:   BP 128/76   Pulse 91   Temp 98.4 F (36.9 C) (Oral)   Resp 18   Ht 5\' 3"  (1.6 m)   Wt 162 lb (73.5 kg)   SpO2 99%   BMI 28.70 kg/m   Gen: well appearing, in NAD Card: RRR Lungs: CTAB Abd: soft, NTND, +BS Ext: WWP, no edema  ASSESSMENT/PLAN:   Abdominal pain With N/V, now resolved. Likely 2/2 viral vs food-borne illness. Continue supportive care. F/u if symptoms return.    Myles Gip, DO

## 2021-02-21 NOTE — Patient Instructions (Signed)
It was great to see you!  Our plans for today:  - Continue taking it easy and eating blander foods to allow your gut to heal. - If your diarrhea or vomiting comes back or if you develop fevers, come back to see [place].   Take care and seek immediate care sooner if you develop any concerns.   Dr. Ky Barban

## 2021-02-21 NOTE — Patient Instructions (Signed)
Access Code: [email or web address] URL: [email or web address] Date: 02/21/2021 Prepared by: Blanche East  Exercises Hooklying Single Knee to Chest Stretch - 1 x daily - 7 x weekly - 3 sets - 2-3 reps - 15-20 sec hold Supine Lower Trunk Rotation - 1 x daily - 7 x weekly - 1 sets - 1 reps - 1-2 min hold Seated Flexion Stretch - 1 x daily - 7 x weekly - 1 sets - 5 reps - 2 sec hold Cat Cow - 1 x daily - 7 x weekly - 1 sets - 5 reps - 5 sec hold

## 2021-02-21 NOTE — Therapy (Signed)
[place] MAIN [facility] SERVICES [address] [place], [place], [address] Phone: [phone]   Fax:  [phone]  Patient Details  Name: NOVI CALIA MRN: [id number] Date of Birth: 1965/12/27 Referring Provider:  Steele Sizer, MD  Encounter Date: 02/21/2021  PT/OT/SLP Screening Form   Time: in__1306____     Time out_1330____   Complaint __left side low back pain_________________ Past Medical Hx:  _HTN, GERD, Hypothyroidism, DMx2, HLD_____ Injury Date:____10/26/22_______  Pain Scale: _Worst: 6-7/10 this AM, current: 2/10; denies any radiating pain; _ __________ Patient's phone number: [phone]  Hx (this occurrence):  Patient reports waking up this morning and feeling increased left side low back pain; She reports feeling like a "Catch" in her back. She reports it was pretty bad this morning but as she has been moving around its been better; She denies any specific injury although states she has been moving some linen bags at work which can get heavy;   Denies any numbness/tingling; Denies any radicular symptoms No change to bowel/bladder Reports doing a lot of tossing/turning last night     Assessment:  Pt reports no change in pain with lumbar flexion/extension; Exhibits good AROM in all directions  Exhibits moderate tightness in left quadratus lumborum and multifidi;   Central PA mobs in lumbar spine are unremarkable with good mobility and no pain reported;  SI joints are equal in height with good mobility, (-) [place] test  Denies any radicular symptoms;  Assessment consistent with possible left lumbar paraspinal strain   Recommendations:    Comments: Provided patient with written HEP for lumbar stretches to help reduce tightness. Educated patient in ice/heat to use as needed for pain control  Recommend try stretches for 2 weeks if pain persists or doesn't improve, recommend referral from PCP for full PT evaluation  and treatment;    []  Patient would benefit from an MD referral []  Patient would benefit from a full PT/OT/ SLP evaluation and treatment. []  No intervention recommended at this time.  Caelynn Marshman PT, DPT 02/21/2021, 1:38 PM  [place] [facility] SERVICES [address] [place], [place], [address] Phone: [phone]   Fax:  [phone]

## 2021-02-24 ENCOUNTER — Other Ambulatory Visit: Payer: Self-pay | Admitting: Family Medicine

## 2021-02-24 DIAGNOSIS — I10 Essential (primary) hypertension: Secondary | ICD-10-CM

## 2021-02-24 DIAGNOSIS — E1159 Type 2 diabetes mellitus with other circulatory complications: Secondary | ICD-10-CM

## 2021-02-24 DIAGNOSIS — I152 Hypertension secondary to endocrine disorders: Secondary | ICD-10-CM

## 2021-02-24 NOTE — Telephone Encounter (Signed)
Requested medication (s) are due for refill today: yes  Requested medication (s) are on the active medication list: yes  Last refill:  09/20/20 #90 1 RF  Future visit scheduled: yes  Notes to clinic:  overdue lab work   Requested Prescriptions  Pending Prescriptions Disp Refills   telmisartan-hydrochlorothiazide (MICARDIS HCT) 80-25 MG tablet [Pharmacy Med Name: TELMISARTAN/HCTZ 80-25MG  TABS] 90 tablet 1    Sig: Take 1 tablet by mouth daily.     Cardiovascular: ARB + Diuretic Combos Failed - 02/24/2021  9:16 AM      Failed - K in normal range and within 180 days    Potassium  Date Value Ref Range Status  02/07/2020 3.8 3.5 - 5.3 mmol/L Final  05/19/2014 3.6 3.5 - 5.1 mmol/L Final          Failed - Na in normal range and within 180 days    Sodium  Date Value Ref Range Status  02/07/2020 139 135 - 146 mmol/L Final  05/19/2017 141 137 - 147 Final  05/19/2014 137 136 - 145 mmol/L Final          Failed - Cr in normal range and within 180 days    Creat  Date Value Ref Range Status  02/07/2020 0.67 0.50 - 1.05 mg/dL Final    Comment:    For patients >23 years of age, the reference limit for Creatinine is approximately 13% higher for people identified as African-American. .    Creatinine, Urine  Date Value Ref Range Status  05/28/2019 74 20 - 275 mg/dL Final          Failed - Ca in normal range and within 180 days    Calcium  Date Value Ref Range Status  02/07/2020 9.7 8.6 - 10.4 mg/dL Final   Calcium, Total  Date Value Ref Range Status  05/19/2014 9.0 8.5 - 10.1 mg/dL Final          Passed - Patient is not pregnant      Passed - Last BP in normal range    BP Readings from Last 1 Encounters:  02/21/21 128/76          Passed - Valid encounter within last 6 months    Recent Outpatient Visits           3 days ago Generalized abdominal pain   [place], DO   4 months ago COVID-76   [facility]  [place], Drue Stager, MD   5 months ago Dyslipidemia associated with type 2 diabetes mellitus [facility])   [facility] Steele Sizer, MD   9 months ago Dyslipidemia associated with type 2 diabetes mellitus [facility])   [facility] [place], Drue Stager, MD   1 year ago Hypertension associated with type 2 diabetes mellitus [facility])   [facility] Steele Sizer, MD       Future Appointments             In 1 month Ancil Boozer, Drue Stager, MD [facility], [place]   In 2 months Steele Sizer, MD [facility], [facility]

## 2021-02-26 ENCOUNTER — Other Ambulatory Visit: Payer: Self-pay

## 2021-02-27 NOTE — Telephone Encounter (Signed)
Pt has appt 11/28

## 2021-03-05 ENCOUNTER — Other Ambulatory Visit: Payer: Self-pay | Admitting: Family Medicine

## 2021-03-05 DIAGNOSIS — E1169 Type 2 diabetes mellitus with other specified complication: Secondary | ICD-10-CM

## 2021-03-09 ENCOUNTER — Telehealth: Payer: Federal, State, Local not specified - PPO | Admitting: Internal Medicine

## 2021-03-09 ENCOUNTER — Other Ambulatory Visit: Payer: Self-pay

## 2021-03-09 DIAGNOSIS — J029 Acute pharyngitis, unspecified: Secondary | ICD-10-CM | POA: Diagnosis not present

## 2021-03-21 NOTE — Progress Notes (Signed)
Name: Valerie Manning   MRN: [id number]    DOB: July 18, 1965   Date:03/26/2021       Progress Note  Subjective  Chief Complaint  Follow Up  HPI  HTN: bp is at goal , no chest pain, palpitation or dizziness. She is  compliant with medication and no side effects of medication . She has not been checking bp at home    Hypothyroidism: history of goiter treated with iodine, she used to see  Dr. Rosario Jacks, taking Synthroid 150 mcg daily and TSH has been at goal for years. Denies dysphagia, dry skin or change in bowel movements.     DMII:  She is taking glipizide XL 2.5 mg and Metformin 1500 mg, mg, A1C is at goal today it was 6.2 % and she denies hypoglycemic episodes. She denies polyphagia, polydipsia or polyuria..  She has been taking atorvastatin for dyslipidemia. Last LDL was at goal at 55 , no side effects of medication. She is due for urine micro. She also has associated HTN and bp is at goal    Hyperlipidemia: taking atorvastatin, denies myalgia, LDL was at goal at 50, normal triglycerides. Last LDL 50 and we will recheck it next visit since she does not want to get stuck today   GERD: resolved, no longer taking medication, she thinks it may have improved since she has been avoiding spicy food   History of iron deficiency anemia: last levels improved, denies pica    Patient Active Problem List   Diagnosis Date Noted   Adhesive capsulitis of left shoulder 05/12/2019   Elevated red blood cell count 02/18/2018   Lipoma of neck 02/27/2016   Status post abdominal hysterectomy and left salpingo-oophorectomy 10/25/2015   Acquired hypothyroidism 03/13/2015   History of iron deficiency anemia 03/13/2015   Hypertension 08/15/2014   Dyslipidemia associated with type 2 diabetes mellitus ([place]) 05/31/2014   GERD (gastroesophageal reflux disease) 05/31/2014   Hyperlipidemia 05/31/2014    Past Surgical History:  Procedure Laterality Date   ABDOMINAL HYSTERECTOMY     BREAST BIOPSY Right 05/2015    Pathology revealed stromal fibrosis with intra atrophic benign x2 areas   BREAST BIOPSY Right 09/13/2016   Procedure: BREAST BIOPSY WITH NEEDLE LOCALIZATION;  Surgeon: [name], MD;  Location: [facility] ORS;  Service: General;  Laterality: Right;   BREAST EXCISIONAL BIOPSY Right 06/16/2016   lumpectomy complext sclerosing lesion, benign    Family History  Problem Relation Age of Onset   Diabetes Mother    Diabetes Sister    Heart disease Sister    Diabetes Sister    Diabetes Sister    Thyroid disease Sister    Diabetes Sister    Thyroid disease Sister    Thyroid disease Sister    Heart disease Father    Cancer Neg Hx    Breast cancer Neg Hx     Social History   Tobacco Use   Smoking status: Never   Smokeless tobacco: Never  Substance Use Topics   Alcohol use: No    Alcohol/week: 0.0 standard drinks     Current Outpatient Medications:    aspirin EC 81 MG tablet, Take 81 mg by mouth daily. , Disp: , Rfl:    glipiZIDE (GLUCOTROL XL) 2.5 MG 24 hr tablet, TAKE 1 TABLET(2.5 MG) BY MOUTH DAILY WITH BREAKFAST, Disp: 90 tablet, Rfl: 0   glucose blood (ONETOUCH VERIO) test strip, Use as instructed, Disp: 100 each, Rfl: 12   levothyroxine (SYNTHROID) 150 MCG tablet, TAKE  1 TABLET(150 MCG) BY MOUTH DAILY BEFORE BREAKFAST, Disp: 90 tablet, Rfl: 2   Multiple Vitamin (MULTIVITAMIN) capsule, Take 1 capsule by mouth daily., Disp: , Rfl:    atorvastatin (LIPITOR) 20 MG tablet, Take 1 tablet (20 mg total) by mouth daily., Disp: 90 tablet, Rfl: 1   estradiol (ESTRACE) 0.5 MG tablet, Take 1 tablet (0.5 mg total) by mouth daily., Disp: 90 tablet, Rfl: 1   metFORMIN (GLUCOPHAGE-XR) 750 MG 24 hr tablet, Take 2 tablets (1,500 mg total) by mouth daily with breakfast., Disp: 180 tablet, Rfl: 1   telmisartan-hydrochlorothiazide (MICARDIS HCT) 80-25 MG tablet, Take 1 tablet by mouth daily., Disp: 90 tablet, Rfl: 1  No Known Allergies  I personally reviewed active problem list, medication list,  allergies, family history, social history, health maintenance with the patient/caregiver today.   ROS  Constitutional: Negative for fever or weight change.  Respiratory: Negative for cough and shortness of breath.   Cardiovascular: Negative for chest pain or palpitations.  Gastrointestinal: Negative for abdominal pain, no bowel changes.  Musculoskeletal: Negative for gait problem or joint swelling.  Skin: Negative for rash.  Neurological: Negative for dizziness or headache.  No other specific complaints in a complete review of systems (except as listed in HPI above).   Objective  Vitals:   03/26/21 0948  BP: 132/84  Pulse: 89  Resp: 16  Temp: 97.9 F (36.6 C)  SpO2: 98%  Weight: 163 lb (73.9 kg)  Height: 5\' 3"  (1.6 m)    Body mass index is 28.87 kg/m.  Physical Exam  Constitutional: Patient appears well-developed and well-nourished.  No distress.  HEENT: head atraumatic, normocephalic, pupils equal and reactive to light,neck supple Cardiovascular: Normal rate, regular rhythm and normal heart sounds.  No murmur heard. No BLE edema. Pulmonary/Chest: Effort normal and breath sounds normal. No respiratory distress. Abdominal: Soft.  There is no tenderness. Psychiatric: Patient has a normal mood and affect. behavior is normal. Judgment and thought content normal.    PHQ2/9: Depression screen [facility] 2/9 03/26/2021 02/21/2021 10/17/2020 09/20/2020 05/23/2020  Decreased Interest 0 0 0 0 0  Down, Depressed, Hopeless 0 0 0 0 0  PHQ - 2 Score 0 0 0 0 0  Altered sleeping 0 0 - - 0  Tired, decreased energy 0 0 - - 0  Change in appetite 0 0 - - 0  Feeling bad or failure about yourself  0 0 - - 0  Trouble concentrating 0 0 - - 0  Moving slowly or fidgety/restless 0 0 - - 0  Suicidal thoughts 0 0 - - 0  PHQ-9 Score 0 0 - - 0  Difficult doing work/chores - Not difficult at all - - -  Some recent data might be hidden    phq 9 is negative   Fall Risk: Fall Risk  03/26/2021  02/21/2021 10/17/2020 09/20/2020 05/23/2020  Falls in the past year? 0 0 0 0 0  Number falls in past yr: 0 0 0 0 0  Injury with Fall? 0 0 0 0 0  Risk for fall due to : No Fall Risks No Fall Risks - - -  Follow up Falls prevention discussed Falls prevention discussed - Falls prevention discussed -      Functional Status Survey: Is the patient deaf or have difficulty hearing?: No Does the patient have difficulty seeing, even when wearing glasses/contacts?: No Does the patient have difficulty concentrating, remembering, or making decisions?: No Does the patient have difficulty walking or climbing stairs?: No  Does the patient have difficulty dressing or bathing?: No Does the patient have difficulty doing errands alone such as visiting a doctor's office or shopping?: No    Assessment & Plan  1. Dyslipidemia associated with type 2 diabetes mellitus (HCC)  - Lipid panel - Microalbumin / creatinine urine ratio - atorvastatin (LIPITOR) 20 MG tablet; Take 1 tablet (20 mg total) by mouth daily.  Dispense: 90 tablet; Refill: 1 - metFORMIN (GLUCOPHAGE-XR) 750 MG 24 hr tablet; Take 2 tablets (1,500 mg total) by mouth daily with breakfast.  Dispense: 180 tablet; Refill: 1  2. Acquired hypothyroidism   3. Hypertension, benign  - telmisartan-hydrochlorothiazide (MICARDIS HCT) 80-25 MG tablet; Take 1 tablet by mouth daily.  Dispense: 90 tablet; Refill: 1  4. Gastroesophageal reflux disease with esophagitis without hemorrhage   5. History of iron deficiency anemia   6. Hypertension associated with type 2 diabetes mellitus (HCC)  - telmisartan-hydrochlorothiazide (MICARDIS HCT) 80-25 MG tablet; Take 1 tablet by mouth daily.  Dispense: 90 tablet; Refill: 1  7. Dyslipidemia  - atorvastatin (LIPITOR) 20 MG tablet; Take 1 tablet (20 mg total) by mouth daily.  Dispense: 90 tablet; Refill: 1

## 2021-03-26 ENCOUNTER — Ambulatory Visit: Payer: Federal, State, Local not specified - PPO | Admitting: Family Medicine

## 2021-03-26 ENCOUNTER — Other Ambulatory Visit: Payer: Self-pay

## 2021-03-26 ENCOUNTER — Encounter: Payer: Self-pay | Admitting: Family Medicine

## 2021-03-26 VITALS — BP 132/84 | HR 89 | Temp 97.9°F | Resp 16 | Ht 63.0 in | Wt 163.0 lb

## 2021-03-26 DIAGNOSIS — E1159 Type 2 diabetes mellitus with other circulatory complications: Secondary | ICD-10-CM

## 2021-03-26 DIAGNOSIS — K2100 Gastro-esophageal reflux disease with esophagitis, without bleeding: Secondary | ICD-10-CM

## 2021-03-26 DIAGNOSIS — E1169 Type 2 diabetes mellitus with other specified complication: Secondary | ICD-10-CM

## 2021-03-26 DIAGNOSIS — I152 Hypertension secondary to endocrine disorders: Secondary | ICD-10-CM

## 2021-03-26 DIAGNOSIS — I10 Essential (primary) hypertension: Secondary | ICD-10-CM | POA: Diagnosis not present

## 2021-03-26 DIAGNOSIS — Z862 Personal history of diseases of the blood and blood-forming organs and certain disorders involving the immune mechanism: Secondary | ICD-10-CM

## 2021-03-26 DIAGNOSIS — E785 Hyperlipidemia, unspecified: Secondary | ICD-10-CM

## 2021-03-26 DIAGNOSIS — E039 Hypothyroidism, unspecified: Secondary | ICD-10-CM

## 2021-03-26 LAB — POCT GLYCOSYLATED HEMOGLOBIN (HGB A1C): Hemoglobin A1C: 6.2 % — AB (ref 4.0–5.6)

## 2021-03-26 MED ORDER — METFORMIN HCL ER 750 MG PO TB24
1500.0000 mg | ORAL_TABLET | Freq: Every day | ORAL | 1 refills | Status: DC
Start: 2021-03-26 — End: 2021-09-28

## 2021-03-26 MED ORDER — ESTRADIOL 0.5 MG PO TABS: 0.5000 mg | ORAL_TABLET | Freq: Every day | ORAL | 1 refills | Status: DC

## 2021-03-26 MED ORDER — TELMISARTAN-HCTZ 80-25 MG PO TABS: 1.0000 | ORAL_TABLET | Freq: Every day | ORAL | 1 refills | Status: DC

## 2021-03-26 MED ORDER — ATORVASTATIN CALCIUM 20 MG PO TABS
20.0000 mg | ORAL_TABLET | Freq: Every day | ORAL | 1 refills | Status: DC
Start: 2021-03-26 — End: 2021-10-26

## 2021-03-26 NOTE — Addendum Note (Signed)
Addended by: Carlene Coria on: 03/26/2021 10:28 AM   Modules accepted: Orders

## 2021-03-27 LAB — MICROALBUMIN / CREATININE URINE RATIO
Creatinine, Urine: 76 mg/dL (ref 20–275)
Microalb Creat Ratio: 4 mcg/mg creat (ref <30)
Microalb, Ur: 0.3 mg/dL

## 2021-03-27 LAB — LIPID PANEL
Cholesterol: 145 mg/dL (ref <200)
HDL: 66 mg/dL (ref >=50)
LDL Cholesterol (Calc): 61 mg/dL (calc)
Non-HDL Cholesterol (Calc): 79 mg/dL (calc) (ref <130)
Total CHOL/HDL Ratio: 2.2 (calc) (ref <5.0)
Triglycerides: 93 mg/dL (ref <150)

## 2021-05-23 NOTE — Progress Notes (Signed)
Name: Valerie Manning   MRN: [id number]    DOB: [birth year]   Date:05/24/2021       Progress Note  Subjective  Chief Complaint  Annual Exam  HPI  Patient presents for annual CPE   Diet: most meals are cooked at home, avoiding sweet beverages  Exercise: continue regular physical activity    [place] Office Visit from 05/24/2021 in [facility]  AUDIT-C Score 1      Depression: Phq 9 is  negative Depression screen [facility] 2/9 05/24/2021 03/26/2021 02/21/2021 10/17/2020 09/20/2020  Decreased Interest 0 0 0 0 0  Down, Depressed, Hopeless 0 0 0 0 0  PHQ - 2 Score 0 0 0 0 0  Altered sleeping 0 0 0 - -  Tired, decreased energy 0 0 0 - -  Change in appetite 0 0 0 - -  Feeling bad or failure about yourself  0 0 0 - -  Trouble concentrating 0 0 0 - -  Moving slowly or fidgety/restless 0 0 0 - -  Suicidal thoughts 0 0 0 - -  PHQ-9 Score 0 0 0 - -  Difficult doing work/chores - - Not difficult at all - -  Some recent data might be hidden   Hypertension: BP Readings from Last 3 Encounters:  05/24/21 138/82  03/26/21 132/84  02/21/21 128/76   Obesity: Wt Readings from Last 3 Encounters:  05/24/21 164 lb (74.4 kg)  03/26/21 163 lb (73.9 kg)  02/21/21 162 lb (73.5 kg)   BMI Readings from Last 3 Encounters:  05/24/21 29.05 kg/m  03/26/21 28.87 kg/m  02/21/21 28.70 kg/m     Vaccines:   Shingrix: up to date COVID-19: discussed bivalent booster  Pneumonia: up to date  Flu:up to date   Hep C Screening: 02/07/20 STD testing and prevention (HIV/chl/gon/syphilis): N/A Intimate partner violence: Negative Sexual History : no pain during intercourse or post-coital bleeding  Menstrual History/LMP/Abnormal Bleeding: s/p hysterectomy  Incontinence Symptoms: bladder symptoms   Breast cancer:  - Last Mammogram: 10/26/20 - BRCA gene screening: N/A  Osteoporosis: Discussed high calcium and vitamin D supplementation, weight bearing exercises  Cervical cancer  screening: N/A s/p abdominal hysterectomy   Skin cancer: Discussed monitoring for atypical lesions  Colorectal cancer: 09/13/12   Lung cancer: Low Dose CT Chest recommended if Age 12-80 years, 20 pack-year currently smoking OR have quit w/in 15years. Patient does not qualify.   ECG: 09/13/12  Advanced Care Planning: A voluntary discussion about advance care planning including the explanation and discussion of advance directives.  Discussed health care proxy and Living will, and the patient was able to identify a health care proxy as husband.  Patient does not have a living will at present time. If patient does have living will, I have requested they bring this to the clinic to be scanned in to their chart.  Lipids: Lab Results  Component Value Date   CHOL 145 03/26/2021   CHOL 133 02/07/2020   CHOL 142 01/12/2019   Lab Results  Component Value Date   HDL 66 03/26/2021   HDL 65 02/07/2020   HDL 67 01/12/2019   Lab Results  Component Value Date   LDLCALC 61 03/26/2021   LDLCALC 50 02/07/2020   LDLCALC 48 01/12/2019   Lab Results  Component Value Date   TRIG 93 03/26/2021   TRIG 92 02/07/2020   TRIG 200 (H) 01/12/2019   Lab Results  Component Value Date   CHOLHDL 2.2 03/26/2021  CHOLHDL 2.0 02/07/2020   CHOLHDL 2.1 01/12/2019   No results found for: LDLDIRECT  Glucose: Glucose  Date Value Ref Range Status  05/19/2014 186 (H) 65 - 99 mg/dL Final  02/03/2014 216 (H) 65 - 99 mg/dL Final  09/15/2013 213 (H) 65 - 99 mg/dL Final   Glucose, Bld  Date Value Ref Range Status  02/07/2020 148 (H) 65 - 99 mg/dL Final    Comment:    .            Fasting reference interval . For someone without known diabetes, a glucose value >125 mg/dL indicates that they may have diabetes and this should be confirmed with a follow-up test. .   01/12/2019 82 65 - 99 mg/dL Final    Comment:    .            Fasting reference interval .   02/13/2018 121 (H) 70 - 99 mg/dL Final    Glucose-Capillary  Date Value Ref Range Status  09/13/2016 145 (H) 65 - 99 mg/dL Final    Patient Active Problem List   Diagnosis Date Noted   Adhesive capsulitis of left shoulder 05/12/2019   Elevated red blood cell count 02/18/2018   Lipoma of neck 02/27/2016   Status post abdominal hysterectomy and left salpingo-oophorectomy 10/25/2015   Acquired hypothyroidism 03/13/2015   History of iron deficiency anemia 03/13/2015   Hypertension 08/15/2014   Dyslipidemia associated with type 2 diabetes mellitus ([place]) 05/31/2014   GERD (gastroesophageal reflux disease) 05/31/2014   Hyperlipidemia 05/31/2014    Past Surgical History:  Procedure Laterality Date   ABDOMINAL HYSTERECTOMY  08/10/2013   pathology report contains cervix   BREAST BIOPSY Right 05/2015   Pathology revealed stromal fibrosis with intra atrophic benign x2 areas   BREAST BIOPSY Right 09/13/2016   Procedure: BREAST BIOPSY WITH NEEDLE LOCALIZATION;  Surgeon: [name], MD;  Location: [facility] ORS;  Service: General;  Laterality: Right;   BREAST EXCISIONAL BIOPSY Right 06/16/2016   lumpectomy complext sclerosing lesion, benign    Family History  Problem Relation Age of Onset   Diabetes Mother    Diabetes Sister    Heart disease Sister    Diabetes Sister    Diabetes Sister    Thyroid disease Sister    Diabetes Sister    Thyroid disease Sister    Thyroid disease Sister    Heart disease Father    Cancer Neg Hx    Breast cancer Neg Hx     Social History   Socioeconomic History   Marital status: Married    Spouse name: [name]   Number of children: 2   Years of education: Not on file   Highest education level: High school graduate  Occupational History   Not on file  Tobacco Use   Smoking status: Never   Smokeless tobacco: Never  Vaping Use   Vaping Use: Never used  Substance and Sexual Activity   Alcohol use: No    Alcohol/week: 0.0 standard drinks   Drug use: No   Sexual activity: Yes     Partners: Male    Birth control/protection: Surgical  Other Topics Concern   Not on file  Social History Narrative   Patient has a combined 3 children but she only birthed 2.   Social Determinants of Health   Financial Resource Strain: Low Risk    Difficulty of Paying Living Expenses: Not hard at all  Food Insecurity: No Food Insecurity   Worried About Running Out  of Food in the Last Year: Never true   [place] in the Last Year: Never true  Transportation Needs: No Transportation Needs   Lack of Transportation (Medical): No   Lack of Transportation (Non-Medical): No  Physical Activity: Sufficiently Active   Days of Exercise per Week: 5 days   Minutes of Exercise per Session: 60 min  Stress: No Stress Concern Present   Feeling of Stress : Not at all  Social Connections: Socially Integrated   Frequency of Communication with Friends and Family: More than three times a week   Frequency of Social Gatherings with Friends and Family: Twice a week   Attends Religious Services: 1 to 4 times per year   Active Member of [employer] or Organizations: Yes   Attends Music therapist: More than 4 times per year   Marital Status: Married  Human resources officer Violence: Not At Risk   Fear of Current or Ex-Partner: No   Emotionally Abused: No   Physically Abused: No   Sexually Abused: No     Current Outpatient Medications:    aspirin EC 81 MG tablet, Take 81 mg by mouth daily. , Disp: , Rfl:    atorvastatin (LIPITOR) 20 MG tablet, Take 1 tablet (20 mg total) by mouth daily., Disp: 90 tablet, Rfl: 1   glipiZIDE (GLUCOTROL XL) 2.5 MG 24 hr tablet, TAKE 1 TABLET(2.5 MG) BY MOUTH DAILY WITH BREAKFAST, Disp: 90 tablet, Rfl: 0   glucose blood (ONETOUCH VERIO) test strip, Use as instructed, Disp: 100 each, Rfl: 12   levothyroxine (SYNTHROID) 150 MCG tablet, TAKE 1 TABLET(150 MCG) BY MOUTH DAILY BEFORE BREAKFAST, Disp: 90 tablet, Rfl: 2   metFORMIN (GLUCOPHAGE-XR) 750 MG 24 hr tablet, Take  2 tablets (1,500 mg total) by mouth daily with breakfast., Disp: 180 tablet, Rfl: 1   Multiple Vitamin (MULTIVITAMIN) capsule, Take 1 capsule by mouth daily., Disp: , Rfl:    telmisartan-hydrochlorothiazide (MICARDIS HCT) 80-25 MG tablet, Take 1 tablet by mouth daily., Disp: 90 tablet, Rfl: 1   estradiol (ESTRACE) 0.5 MG tablet, Take 1 tablet (0.5 mg total) by mouth daily. (Patient not taking: Reported on 05/24/2021), Disp: 90 tablet, Rfl: 1  No Known Allergies   ROS  Constitutional: Negative for fever or weight change.  Respiratory: Negative for cough and shortness of breath.   Cardiovascular: Negative for chest pain or palpitations.  Gastrointestinal: Negative for abdominal pain, no bowel changes.  Musculoskeletal: Negative for gait problem or joint swelling.  Skin: Negative for rash.  Neurological: Negative for dizziness or headache.  No other specific complaints in a complete review of systems (except as listed in HPI above).   Objective  Vitals:   05/24/21 0751 05/24/21 0847  BP: (!) 150/90 138/82  Pulse: 95   Resp: 16   SpO2: 99%   Weight: 164 lb (74.4 kg)   Height: 5' 3"  (1.6 m)     Body mass index is 29.05 kg/m.  Physical Exam  Constitutional: Patient appears well-developed and well-nourished. No distress.  HENT: Head: Normocephalic and atraumatic. Ears: B TMs ok, no erythema or effusion; Nose: Not done. Mouth/Throat: not done  Eyes: Conjunctivae and EOM are normal. Pupils are equal, round, and reactive to light. No scleral icterus.  Neck: Normal range of motion. Neck supple. No JVD present. No thyromegaly present.  Cardiovascular: Normal rate, regular rhythm and normal heart sounds.  No murmur heard. No BLE edema. Pulmonary/Chest: Effort normal and breath sounds normal. No respiratory distress. Abdominal: Soft. Bowel  sounds are normal, no distension. There is no tenderness. no masses Breast: no lumps or masses, no nipple discharge or rashes FEMALE GENITALIA:  Not  done  RECTAL: not done  Musculoskeletal: Normal range of motion, no joint effusions. No gross deformities Neurological: he is alert and oriented to person, place, and time. No cranial nerve deficit. Coordination, balance, strength, speech and gait are normal.  Skin: Skin is warm and dry. No rash noted. No erythema.  Psychiatric: Patient has a normal mood and affect. behavior is normal. Judgment and thought content normal.   Recent Results (from the past 2160 hour(s))  POCT HgB A1C     Status: Abnormal   Collection Time: 03/26/21 10:27 AM  Result Value Ref Range   Hemoglobin A1C 6.2 (A) 4.0 - 5.6 %   HbA1c POC (<> result, manual entry)     HbA1c, POC (prediabetic range)     HbA1c, POC (controlled diabetic range)    Lipid panel     Status: None   Collection Time: 03/26/21 10:37 AM  Result Value Ref Range   Cholesterol 145 <200 mg/dL   HDL 66 > OR = 50 mg/dL   Triglycerides 93 <150 mg/dL   LDL Cholesterol (Calc) 61 mg/dL (calc)    Comment: Reference range: <100 . Desirable range <100 mg/dL for primary prevention;   <70 mg/dL for patients with CHD or diabetic patients  with > or = 2 CHD risk factors. Marland Kitchen LDL-C is now calculated using the Martin-Hopkins  calculation, which is a validated novel method providing  better accuracy than the Friedewald equation in the  estimation of LDL-C.  Cresenciano Genre et al. Annamaria Helling. 1610;960(45): 2061-2068  (http://education.[facility].com/faq/FAQ164)    Total CHOL/HDL Ratio 2.2 <5.0 (calc)   Non-HDL Cholesterol (Calc) 79 <130 mg/dL (calc)    Comment: For patients with diabetes plus 1 major ASCVD risk  factor, treating to a non-HDL-C goal of <100 mg/dL  (LDL-C of <70 mg/dL) is considered a therapeutic  option.   Microalbumin / creatinine urine ratio     Status: None   Collection Time: 03/26/21 10:37 AM  Result Value Ref Range   Creatinine, Urine 76 20 - 275 mg/dL   Microalb, Ur 0.3 mg/dL    Comment: Reference Range Not established    Microalb  Creat Ratio 4 <30 mcg/mg creat    Comment: . The ADA defines abnormalities in albumin excretion as follows: Marland Kitchen Albuminuria Category        Result (mcg/mg creatinine) . Normal to Mildly increased   <30 Moderately increased         30-299  Severely increased           > OR = 300 . The ADA recommends that at least two of three specimens collected within a 3-6 month period be abnormal before considering a patient to be within a diagnostic category.      Fall Risk: Fall Risk  05/24/2021 03/26/2021 02/21/2021 10/17/2020 09/20/2020  Falls in the past year? 0 0 0 0 0  Number falls in past yr: 0 0 0 0 0  Injury with Fall? 0 0 0 0 0  Risk for fall due to : No Fall Risks No Fall Risks No Fall Risks - -  Follow up Falls prevention discussed Falls prevention discussed Falls prevention discussed - Falls prevention discussed     Functional Status Survey: Is the patient deaf or have difficulty hearing?: No Does the patient have difficulty seeing, even when wearing glasses/contacts?: No Does the  patient have difficulty concentrating, remembering, or making decisions?: No Does the patient have difficulty walking or climbing stairs?: No Does the patient have difficulty dressing or bathing?: No Does the patient have difficulty doing errands alone such as visiting a doctor's office or shopping?: No   Assessment & Plan  1. Well adult exam  Reminded her to get eye exams, yearly dental exams Eat a high calcium diet and take vitamin D 2000 units daily   2. Breast cancer screening by mammogram  - MM 3D SCREEN BREAST BILATERAL; Future   -USPSTF grade A and B recommendations reviewed with patient; age-appropriate recommendations, preventive care, screening tests, etc discussed and encouraged; healthy living encouraged; see AVS for patient education given to patient -Discussed importance of 150 minutes of physical activity weekly, eat two servings of fish weekly, eat one serving of tree nuts (  cashews, pistachios, pecans, almonds.Marland Kitchen) every other day, eat 6 servings of fruit/vegetables daily and drink plenty of water and avoid sweet beverages.

## 2021-05-24 ENCOUNTER — Encounter: Payer: Self-pay | Admitting: Family Medicine

## 2021-05-24 ENCOUNTER — Other Ambulatory Visit: Payer: Self-pay

## 2021-05-24 ENCOUNTER — Ambulatory Visit (INDEPENDENT_AMBULATORY_CARE_PROVIDER_SITE_OTHER): Payer: Federal, State, Local not specified - PPO | Admitting: Family Medicine

## 2021-05-24 VITALS — BP 138/82 | HR 95 | Resp 16 | Ht 63.0 in | Wt 164.0 lb

## 2021-05-24 DIAGNOSIS — Z0000 Encounter for general adult medical examination without abnormal findings: Secondary | ICD-10-CM | POA: Diagnosis not present

## 2021-05-24 DIAGNOSIS — Z1231 Encounter for screening mammogram for malignant neoplasm of breast: Secondary | ICD-10-CM

## 2021-05-24 NOTE — Patient Instructions (Signed)

## 2021-06-12 ENCOUNTER — Encounter: Payer: Self-pay | Admitting: Internal Medicine

## 2021-06-12 ENCOUNTER — Ambulatory Visit (INDEPENDENT_AMBULATORY_CARE_PROVIDER_SITE_OTHER): Payer: Federal, State, Local not specified - PPO | Admitting: Internal Medicine

## 2021-06-12 VITALS — Wt 164.0 lb

## 2021-06-12 DIAGNOSIS — J069 Acute upper respiratory infection, unspecified: Secondary | ICD-10-CM

## 2021-06-12 DIAGNOSIS — J029 Acute pharyngitis, unspecified: Secondary | ICD-10-CM

## 2021-06-12 DIAGNOSIS — R051 Acute cough: Secondary | ICD-10-CM

## 2021-06-12 MED ORDER — FLUTICASONE PROPIONATE 50 MCG/ACT NA SUSP: 2.0000 | Freq: Every day | NASAL | 6 refills | Status: DC

## 2021-06-12 MED ORDER — BENZONATATE 100 MG PO CAPS: 100.0000 mg | ORAL_CAPSULE | Freq: Two times a day (BID) | ORAL | 0 refills | Status: DC | PRN

## 2021-06-12 NOTE — Progress Notes (Signed)
Virtual Visit via Telephone Note  I connected with Valerie Manning on 06/12/21 at 11:40 AM EST by telephone and verified that I am speaking with the correct person using two identifiers.  Location: Patient: Home Provider: [facility]   I discussed the limitations, risks, security and privacy concerns of performing an evaluation and management service by telephone and the availability of in person appointments. I also discussed with the patient that there may be a patient responsible charge related to this service. The patient expressed understanding and agreed to proceed.   History of Present Illness: Valerie Manning is a 56 year old female presenting over the phone for sore throat and cough x 4 days.   URI Compliant:  -Worst symptom: cough and sore throat  -Fever: no -Cough: yes dry  -Shortness of breath: no -Wheezing: no -Chest pain: yes, with cough -Chest tightness: no -Chest congestion: no -Nasal congestion: no -Runny nose: no -Post nasal drip: yes -Sore throat: yes -Sinus pressure: no -Headache: no -Face pain: no -Ear pain: no    -Ear pressure: no  -Sick contacts: no -Context: stable -Relief with OTC cold/cough medications: no  -Treatments attempted: Cough drops   Observations/Objective:  General: no acute distress Neuro: answers questions appropriately   Assessment and Plan:  1. Upper respiratory tract infection, unspecified type/Acute cough/Sore throat: Will test for COVID today, had all her vaccines and boosters. Most likely viral in nature, will treat symptomatically with cough suppressants and Flonase. Follow up if symptoms worsen or fail to improve.   - Novel Coronavirus, NAA ([facility]) - fluticasone (FLONASE) 50 MCG/ACT nasal spray; Place 2 sprays into both nostrils daily.  Dispense: 16 g; Refill: 6 - benzonatate (TESSALON) 100 MG capsule; Take 1 capsule (100 mg total) by mouth 2 (two) times daily as needed for cough.  Dispense: 20 capsule; Refill: 0  Follow Up  Instructions: PRN    I discussed the assessment and treatment plan with the patient. The patient was provided an opportunity to ask questions and all were answered. The patient agreed with the plan and demonstrated an understanding of the instructions.   The patient was advised to call back or seek an in-person evaluation if the symptoms worsen or if the condition fails to improve as anticipated.  I provided 11 minutes of non-face-to-face time during this encounter.   Teodora Medici, DO

## 2021-06-14 ENCOUNTER — Telehealth: Payer: Self-pay

## 2021-06-14 LAB — NOVEL CORONAVIRUS, NAA: SARS-CoV-2, NAA: NOT DETECTED

## 2021-06-14 NOTE — Telephone Encounter (Signed)
Copied from [place] [id number]. Topic: General - Other >> Jun 14, 2021  9:18 AM Tessa Lerner A wrote: Reason for CRM: The patient would like to speak with a member of staff when possible regarding their covid test results   Please contact further

## 2021-06-14 NOTE — Telephone Encounter (Signed)
Pt states she already spoke with a staff member- didn't remember name. All her questions were answered.

## 2021-06-25 ENCOUNTER — Other Ambulatory Visit: Payer: Self-pay | Admitting: Family Medicine

## 2021-06-25 DIAGNOSIS — E785 Hyperlipidemia, unspecified: Secondary | ICD-10-CM

## 2021-06-25 DIAGNOSIS — E1169 Type 2 diabetes mellitus with other specified complication: Secondary | ICD-10-CM

## 2021-06-25 DIAGNOSIS — E039 Hypothyroidism, unspecified: Secondary | ICD-10-CM

## 2021-09-20 ENCOUNTER — Other Ambulatory Visit: Payer: Self-pay | Admitting: Family Medicine

## 2021-09-20 DIAGNOSIS — E1159 Type 2 diabetes mellitus with other circulatory complications: Secondary | ICD-10-CM

## 2021-09-20 DIAGNOSIS — I10 Essential (primary) hypertension: Secondary | ICD-10-CM

## 2021-09-20 DIAGNOSIS — I152 Hypertension secondary to endocrine disorders: Secondary | ICD-10-CM

## 2021-09-20 DIAGNOSIS — E1169 Type 2 diabetes mellitus with other specified complication: Secondary | ICD-10-CM

## 2021-09-28 ENCOUNTER — Other Ambulatory Visit: Payer: Self-pay | Admitting: Family Medicine

## 2021-09-28 DIAGNOSIS — E1169 Type 2 diabetes mellitus with other specified complication: Secondary | ICD-10-CM

## 2021-10-05 DIAGNOSIS — H52223 Regular astigmatism, bilateral: Secondary | ICD-10-CM | POA: Diagnosis not present

## 2021-10-05 DIAGNOSIS — E119 Type 2 diabetes mellitus without complications: Secondary | ICD-10-CM | POA: Diagnosis not present

## 2021-10-09 ENCOUNTER — Ambulatory Visit: Payer: Federal, State, Local not specified - PPO | Admitting: Family Medicine

## 2021-10-24 ENCOUNTER — Other Ambulatory Visit: Payer: Self-pay | Admitting: Family Medicine

## 2021-10-24 DIAGNOSIS — E785 Hyperlipidemia, unspecified: Secondary | ICD-10-CM

## 2021-10-24 DIAGNOSIS — E1169 Type 2 diabetes mellitus with other specified complication: Secondary | ICD-10-CM

## 2021-10-26 NOTE — Progress Notes (Unsigned)
Name: Valerie Manning   MRN: [id number]    DOB: July 24, 1965   Date:10/29/2021       Progress Note  Subjective  Chief Complaint  Follow Up  HPI  HTN: bp is at goal , no chest pain, palpitation or dizziness. She is  compliant with medication. Denies side effects   Carpal Tunnel Syndrome: started months ago, it was waking her up at night with tingling and numbness of her right hand, however she asked on of the Ortho surgeons at the hospital and he gave her information about hand exercises and she is doing better now, sometimes has stiffness. Denies weakness or dropping objects, discussed ice and topical voltaren , may also war a wrist brace, continue home exercises and if needed we can refer her to Ortho   Hypothyroidism: history of goiter treated with radioactive  iodine, she used to see  Dr. Rosario Jacks, taking Synthroid 150 mcg daily and TSH has been at goal for years. Denies dysphagia, dry skin or change in bowel movements. We will recheck level this Fall     DMII:  She is taking glipizide XL 2.5 mg and Metformin 1500 mg, mg, A1C is at goal today it was 6.2 % and she denies hypoglycemic episodes today A1C is up at 6.6 % She denies polyphagia, polydipsia or polyuria..  She has been taking atorvastatin for dyslipidemia. Last LDL was at goal at 55 , no side effects of medication. She asked to stop taking Glipizide, so we will try switching to [facility], gave her a voucher today and she will contact us if any problems.   Hyperlipidemia: taking atorvastatin, denies myalgia, LDL was 61 - at goal, continue current regiment   History of iron deficiency anemia: last levels improved, denies pica We will recheck it in the Fall    Patient Active Problem List   Diagnosis Date Noted   Carpal tunnel syndrome, right 10/29/2021   Hypertension associated with type 2 diabetes mellitus ([place]) 10/29/2021   Hypertension, benign 10/29/2021   Adhesive capsulitis of left shoulder 05/12/2019   Lipoma of neck 02/27/2016    Status post abdominal hysterectomy and left salpingo-oophorectomy 10/25/2015   Acquired hypothyroidism 03/13/2015   History of iron deficiency anemia 03/13/2015   Hypertension 08/15/2014   Dyslipidemia associated with type 2 diabetes mellitus ([place]) 05/31/2014   GERD (gastroesophageal reflux disease) 05/31/2014   Hyperlipidemia 05/31/2014    Past Surgical History:  Procedure Laterality Date   ABDOMINAL HYSTERECTOMY  08/10/2013   pathology report contains cervix   BREAST BIOPSY Right 05/2015   Pathology revealed stromal fibrosis with intra atrophic benign x2 areas   BREAST BIOPSY Right 09/13/2016   Procedure: BREAST BIOPSY WITH NEEDLE LOCALIZATION;  Surgeon: [name], MD;  Location: [facility] ORS;  Service: General;  Laterality: Right;   BREAST EXCISIONAL BIOPSY Right 06/16/2016   lumpectomy complext sclerosing lesion, benign    Family History  Problem Relation Age of Onset   Diabetes Mother    Diabetes Sister    Heart disease Sister    Diabetes Sister    Diabetes Sister    Thyroid disease Sister    Diabetes Sister    Thyroid disease Sister    Thyroid disease Sister    Heart disease Father    Cancer Neg Hx    Breast cancer Neg Hx     Social History   Tobacco Use   Smoking status: Never   Smokeless tobacco: Never  Substance Use Topics   Alcohol use: No  Alcohol/week: 0.0 standard drinks of alcohol     Current Outpatient Medications:    aspirin EC 81 MG tablet, Take 81 mg by mouth daily. , Disp: , Rfl:    atorvastatin (LIPITOR) 20 MG tablet, TAKE 1 TABLET(20 MG) BY MOUTH DAILY, Disp: 90 tablet, Rfl: 0   Dapagliflozin-metFORMIN HCl ER (XIGDUO XR) 08-998 MG TB24, Take 2 tablets by mouth daily., Disp: 60 tablet, Rfl: 3   diclofenac Sodium (VOLTAREN) 1 % GEL, Apply 2 g topically 4 (four) times daily., Disp: 100 g, Rfl: 0   glucose blood (ONETOUCH VERIO) test strip, Use as instructed, Disp: 100 each, Rfl: 12   levothyroxine (SYNTHROID) 150 MCG tablet, TAKE 1  TABLET(150 MCG) BY MOUTH DAILY BEFORE BREAKFAST, Disp: 90 tablet, Rfl: 2   Multiple Vitamin (MULTIVITAMIN) capsule, Take 1 capsule by mouth daily., Disp: , Rfl:    telmisartan-hydrochlorothiazide (MICARDIS HCT) 80-25 MG tablet, Take 1 tablet by mouth daily., Disp: 30 tablet, Rfl: 3  No Known Allergies  I personally reviewed active problem list, medication list, allergies, family history, social history, health maintenance with the patient/caregiver today.   ROS  Constitutional: Negative for fever or weight change.  Respiratory: Negative for cough and shortness of breath.   Cardiovascular: Negative for chest pain or palpitations.  Gastrointestinal: Negative for abdominal pain, no bowel changes.  Musculoskeletal: Negative for gait problem or joint swelling.  Skin: Negative for rash.  Neurological: Negative for dizziness or headache.  No other specific complaints in a complete review of systems (except as listed in HPI above).   Objective  Vitals:   10/29/21 1326  BP: 132/80  Pulse: 94  Resp: 16  SpO2: 99%  Weight: 165 lb (74.8 kg)  Height: '5\' 3"'$  (1.6 m)    Body mass index is 29.23 kg/m.  Physical Exam  Constitutional: Patient appears well-developed and well-nourished. Overweight.  No distress.  HEENT: head atraumatic, normocephalic, pupils equal and reactive to light, neck supple Cardiovascular: Normal rate, regular rhythm and normal heart sounds.  No murmur heard. No BLE edema. Pulmonary/Chest: Effort normal and breath sounds normal. No respiratory distress. Abdominal: Soft.  There is no tenderness. Psychiatric: Patient has a normal mood and affect. behavior is normal. Judgment and thought content normal.   Recent Results (from the past 2160 hour(s))  POCT HgB A1C     Status: Abnormal   Collection Time: 10/29/21  1:28 PM  Result Value Ref Range   Hemoglobin A1C 6.6 (A) 4.0 - 5.6 %   HbA1c POC (<> result, manual entry)     HbA1c, POC (prediabetic range)     HbA1c, POC  (controlled diabetic range)       PHQ2/9:    10/29/2021    1:27 PM 06/12/2021    9:19 AM 05/24/2021    7:50 AM 03/26/2021    9:44 AM 02/21/2021    2:22 PM  Depression screen PHQ 2/9  Decreased Interest 0 0 0 0 0  Down, Depressed, Hopeless 0 0 0 0 0  PHQ - 2 Score 0 0 0 0 0  Altered sleeping 0 0 0 0 0  Tired, decreased energy 0 0 0 0 0  Change in appetite 0 0 0 0 0  Feeling bad or failure about yourself  0 0 0 0 0  Trouble concentrating 0 0 0 0 0  Moving slowly or fidgety/restless 0 0 0 0 0  Suicidal thoughts 0 0 0 0 0  PHQ-9 Score 0 0 0 0 0  Difficult doing  work/chores  Not difficult at all   Not difficult at all    phq 9 is negative   Fall Risk:    10/29/2021    1:27 PM 06/12/2021    9:19 AM 05/24/2021    7:50 AM 03/26/2021    9:44 AM 02/21/2021    2:22 PM  Fall Risk   Falls in the past year? 0 0 0 0 0  Number falls in past yr: 0 0 0 0 0  Injury with Fall? 0 0 0 0 0  Risk for fall due to : No Fall Risks  No Fall Risks No Fall Risks No Fall Risks  Follow up Falls prevention discussed  Falls prevention discussed Falls prevention discussed Falls prevention discussed      Functional Status Survey: Is the patient deaf or have difficulty hearing?: No Does the patient have difficulty seeing, even when wearing glasses/contacts?: Yes Does the patient have difficulty concentrating, remembering, or making decisions?: No Does the patient have difficulty walking or climbing stairs?: No Does the patient have difficulty dressing or bathing?: No Does the patient have difficulty doing errands alone such as visiting a doctor's office or shopping?: No    Assessment & Plan  1. Dyslipidemia associated with type 2 diabetes mellitus (HCC)  - POCT HgB A1C - Dapagliflozin-metFORMIN HCl ER (XIGDUO XR) 08-998 MG TB24; Take 2 tablets by mouth daily.  Dispense: 60 tablet; Refill: 3  2. Acquired hypothyroidism  Recheck level next visit   3. Hypertension associated with type 2 diabetes  mellitus (HCC)  - Dapagliflozin-metFORMIN HCl ER (XIGDUO XR) 08-998 MG TB24; Take 2 tablets by mouth daily.  Dispense: 60 tablet; Refill: 3 - telmisartan-hydrochlorothiazide (MICARDIS HCT) 80-25 MG tablet; Take 1 tablet by mouth daily.  Dispense: 30 tablet; Refill: 3  4. Hypertension, benign  - telmisartan-hydrochlorothiazide (MICARDIS HCT) 80-25 MG tablet; Take 1 tablet by mouth daily.  Dispense: 30 tablet; Refill: 3  5. History of iron deficiency anemia  Recheck it yearly   6. Carpal tunnel syndrome, right  - diclofenac Sodium (VOLTAREN) 1 % GEL; Apply 2 g topically 4 (four) times daily.  Dispense: 100 g; Refill: 0

## 2021-10-29 ENCOUNTER — Ambulatory Visit (INDEPENDENT_AMBULATORY_CARE_PROVIDER_SITE_OTHER): Payer: Federal, State, Local not specified - PPO | Admitting: Family Medicine

## 2021-10-29 ENCOUNTER — Encounter: Payer: Self-pay | Admitting: Family Medicine

## 2021-10-29 VITALS — BP 132/80 | HR 94 | Resp 16 | Ht 63.0 in | Wt 165.0 lb

## 2021-10-29 DIAGNOSIS — Z862 Personal history of diseases of the blood and blood-forming organs and certain disorders involving the immune mechanism: Secondary | ICD-10-CM

## 2021-10-29 DIAGNOSIS — G5601 Carpal tunnel syndrome, right upper limb: Secondary | ICD-10-CM

## 2021-10-29 DIAGNOSIS — E785 Hyperlipidemia, unspecified: Secondary | ICD-10-CM | POA: Diagnosis not present

## 2021-10-29 DIAGNOSIS — E1159 Type 2 diabetes mellitus with other circulatory complications: Secondary | ICD-10-CM | POA: Diagnosis not present

## 2021-10-29 DIAGNOSIS — I10 Essential (primary) hypertension: Secondary | ICD-10-CM | POA: Insufficient documentation

## 2021-10-29 DIAGNOSIS — E039 Hypothyroidism, unspecified: Secondary | ICD-10-CM | POA: Diagnosis not present

## 2021-10-29 DIAGNOSIS — E1169 Type 2 diabetes mellitus with other specified complication: Secondary | ICD-10-CM | POA: Diagnosis not present

## 2021-10-29 DIAGNOSIS — I152 Hypertension secondary to endocrine disorders: Secondary | ICD-10-CM

## 2021-10-29 LAB — POCT GLYCOSYLATED HEMOGLOBIN (HGB A1C): Hemoglobin A1C: 6.6 % — AB (ref 4.0–5.6)

## 2021-10-29 MED ORDER — TELMISARTAN-HCTZ 80-25 MG PO TABS: 1.0000 | ORAL_TABLET | Freq: Every day | ORAL | 3 refills | Status: DC

## 2021-10-29 MED ORDER — XIGDUO XR 5-1000 MG PO TB24: 2.0000 | ORAL_TABLET | Freq: Every day | ORAL | 3 refills | Status: DC

## 2021-10-29 MED ORDER — DICLOFENAC SODIUM 1 % EX GEL: 2.0000 g | Freq: Four times a day (QID) | CUTANEOUS | 0 refills | Status: DC

## 2021-11-06 ENCOUNTER — Ambulatory Visit
Admission: RE | Admit: 2021-11-06 | Discharge: 2021-11-06 | Disposition: A | Discharge status: Home / Self Care | Payer: Federal, State, Local not specified - PPO | Source: Ambulatory Visit | Attending: Family Medicine | Admitting: Family Medicine

## 2021-11-06 DIAGNOSIS — Z1231 Encounter for screening mammogram for malignant neoplasm of breast: Secondary | ICD-10-CM | POA: Insufficient documentation

## 2021-11-07 ENCOUNTER — Other Ambulatory Visit: Payer: Self-pay | Admitting: Family Medicine

## 2021-11-07 DIAGNOSIS — N6489 Other specified disorders of breast: Secondary | ICD-10-CM

## 2021-11-07 DIAGNOSIS — R928 Other abnormal and inconclusive findings on diagnostic imaging of breast: Secondary | ICD-10-CM

## 2021-11-26 ENCOUNTER — Ambulatory Visit: Payer: Self-pay | Admitting: *Deleted

## 2021-11-26 ENCOUNTER — Ambulatory Visit
Admission: RE | Admit: 2021-11-26 | Discharge: 2021-11-26 | Disposition: A | Discharge status: Home / Self Care | Payer: Federal, State, Local not specified - PPO | Source: Ambulatory Visit | Attending: Family Medicine | Admitting: Family Medicine

## 2021-11-26 DIAGNOSIS — N6489 Other specified disorders of breast: Secondary | ICD-10-CM

## 2021-11-26 DIAGNOSIS — R928 Other abnormal and inconclusive findings on diagnostic imaging of breast: Secondary | ICD-10-CM

## 2021-11-26 NOTE — Telephone Encounter (Signed)
Summary: Medication question   Patient called in stating she had a question about medication she is on. She is wondering why when she was on Metformin 750 mg and glipiZide '5mg'$  and now her combined medicine the Dapagliflozin-metFORMIN HCl ER (XIGDUO XR) 08-998 MG TB24 is 1000 mg?   Please assist patient further        Chief Complaint: medication question Symptoms: more thirsty.  Frequency: na Pertinent Negatives: Patient denies blurred vision, no headaches, no dizziness.  Disposition: '[]'$ ED /'[]'$ Urgent Care (no appt availability in office) / '[]'$ Appointment(In office/virtual)/ '[]'$  [place] [facility]/ '[]'$ Home Care/ '[]'$ Refused Recommended Disposition /'[]'$ [place] [facility]/ '[x]'$  Follow-up with PCP Additional Notes:   Patient requesting to know why new combination drug dapafliflozine- metformin HCl ER 08-998 mg  is more than her previous dose of metformin 750 mg? Patient has a couple of pills left and did not want to refill until talking with PCP. Patient has not take blood glucose levels. Recommended patient check fast glucose in am to see what blood glucose has been running while on new medication. Please advise     Reason for Disposition  [1] Caller has NON-URGENT medicine question about med that PCP prescribed AND [2] triager unable to answer question  Answer Assessment - Initial Assessment Questions 1. NAME of MEDICINE: "What medicine(s) are you calling about?"     Dapagliflozine- metformin HCL ER 5-'1000mg'$   2. QUESTION: "What is your question?" (e.g., double dose of medicine, side effect)     Why is the combination drug have metformin with 1000 mg when she was talking metformin 750 mg as a pill by itself? 3. PRESCRIBER: "Who prescribed the medicine?" Reason: if prescribed by specialist, call should be referred to that group.     PCP 4. SYMPTOMS: "Do you have any symptoms?" If Yes, ask: "What symptoms are you having?"  "How bad are the symptoms (e.g., mild, moderate, severe)     More  thirsty  5. PREGNANCY:  "Is there any chance that you are pregnant?" "When was your last menstrual period?"     na  Protocols used: Medication Question Call-A-AH

## 2021-11-27 NOTE — Telephone Encounter (Signed)
I spoke with patient and clarified it is a combination drug. She did not mention it being out of cost and will continue with it.

## 2021-12-05 ENCOUNTER — Encounter: Payer: Self-pay | Admitting: Family Medicine

## 2021-12-05 ENCOUNTER — Ambulatory Visit (INDEPENDENT_AMBULATORY_CARE_PROVIDER_SITE_OTHER): Payer: Federal, State, Local not specified - PPO | Admitting: Family Medicine

## 2021-12-05 VITALS — BP 112/64 | HR 83 | Resp 16 | Ht 63.0 in | Wt 165.0 lb

## 2021-12-05 DIAGNOSIS — J029 Acute pharyngitis, unspecified: Secondary | ICD-10-CM | POA: Diagnosis not present

## 2021-12-05 DIAGNOSIS — R051 Acute cough: Secondary | ICD-10-CM | POA: Diagnosis not present

## 2021-12-05 MED ORDER — BREZTRI AEROSPHERE 160-9-4.8 MCG/ACT IN AERO: 2.0000 | INHALATION_SPRAY | Freq: Two times a day (BID) | RESPIRATORY_TRACT | 0 refills | Status: DC

## 2021-12-05 MED ORDER — BENZONATATE 100 MG PO CAPS: 100.0000 mg | ORAL_CAPSULE | Freq: Three times a day (TID) | ORAL | 0 refills | Status: DC | PRN

## 2021-12-05 NOTE — Progress Notes (Signed)
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 Appetite is normal. Denies nausea, vomiting or change in bowel movements.   She states sore throat has improved but the cough is getting worse. Denies SOB or wheezing . Denies fatigue, she has been able to work without problems   Home covid test was negative   Her employer told her she needs to be tested for COVID, explained results will not be back, but since today is day 4, she can take tomorrow off and go back to work on Friday but must wear a mask, needs to discuss it with HR  Patient Active Problem List   Diagnosis Date Noted   Carpal tunnel syndrome, right 10/29/2021   Hypertension associated with type 2 diabetes mellitus ([place]) 10/29/2021   Hypertension, benign 10/29/2021   Adhesive capsulitis of left shoulder 05/12/2019   Lipoma of neck 02/27/2016   Status post abdominal hysterectomy and left salpingo-oophorectomy 10/25/2015   Acquired hypothyroidism 03/13/2015   History of iron deficiency anemia 03/13/2015   Hypertension 08/15/2014   Dyslipidemia associated with type 2 diabetes mellitus ([place]) 05/31/2014   GERD (gastroesophageal reflux disease) 05/31/2014   Hyperlipidemia 05/31/2014    Past Surgical History:  Procedure Laterality Date   ABDOMINAL HYSTERECTOMY  08/10/2013   pathology report contains cervix   BREAST BIOPSY Right 05/2015   Pathology revealed stromal fibrosis with intra atrophic benign x2 areas   BREAST BIOPSY Right 09/13/2016   Procedure: BREAST BIOPSY WITH NEEDLE LOCALIZATION;  Surgeon: [name], MD;  Location: [facility] ORS;  Service: General;  Laterality: Right;   BREAST EXCISIONAL BIOPSY Right  06/16/2016   lumpectomy complext sclerosing lesion, benign    Family History  Problem Relation Age of Onset   Diabetes Mother    Diabetes Sister    Heart disease Sister    Diabetes Sister    Diabetes Sister    Thyroid disease Sister    Diabetes Sister    Thyroid disease Sister    Thyroid disease Sister    Heart disease Father    Cancer Neg Hx    Breast cancer Neg Hx     Social History   Tobacco Use   Smoking status: Never   Smokeless tobacco: Never  Substance Use Topics   Alcohol use: No    Alcohol/week: 0.0 standard drinks of alcohol     Current Outpatient Medications:    aspirin EC 81 MG tablet, Take 81 mg by mouth daily. , Disp: , Rfl:    atorvastatin (LIPITOR) 20 MG tablet, TAKE 1 TABLET(20 MG) BY MOUTH DAILY, Disp: 90 tablet, Rfl: 0   Dapagliflozin-metFORMIN HCl ER (XIGDUO XR) 08-998 MG TB24, Take 2 tablets by mouth daily., Disp: 60 tablet, Rfl: 3   diclofenac Sodium (VOLTAREN) 1 % GEL, Apply 2 g topically 4 (four) times daily., Disp: 100 g, Rfl: 0   glucose blood (ONETOUCH VERIO) test strip, Use as instructed, Disp: 100 each, Rfl: 12   levothyroxine (SYNTHROID) 150 MCG tablet, TAKE 1 TABLET(150 MCG) BY MOUTH DAILY BEFORE BREAKFAST, Disp: 90 tablet, Rfl: 2   Multiple Vitamin (MULTIVITAMIN) capsule, Take 1 capsule by mouth  daily., Disp: , Rfl:    telmisartan-hydrochlorothiazide (MICARDIS HCT) 80-25 MG tablet, Take 1 tablet by mouth daily., Disp: 30 tablet, Rfl: 3  No Known Allergies  I personally reviewed active problem list, medication list, allergies, family history, social history, health maintenance with the patient/caregiver today.   ROS  Ten systems reviewed and is negative except as mentioned in HPI   Objective  Vitals:   12/05/21 1120  BP: 112/64  Pulse: 83  Resp: 16  SpO2: 98%  Weight: 165 lb (74.8 kg)  Height: '5\' 3"'$  (1.6 m)    Body mass index is 29.23 kg/m.  Physical Exam  Constitutional: Patient appears well-developed and  well-nourished.  No distress.  HEENT: head atraumatic, normocephalic, pupils equal and reactive to light, ears normal TM bilaterally , neck supple, mild erythema of posterior pharynx, uvula is long  Cardiovascular: Normal rate, regular rhythm and normal heart sounds.  No murmur heard. No BLE edema. Pulmonary/Chest: Effort normal and breath sounds normal. No respiratory distress. Abdominal: Soft.  There is no tenderness. Psychiatric: Patient has a normal mood and affect. behavior is normal. Judgment and thought content normal.   Recent Results (from the past 2160 hour(s))  POCT HgB A1C     Status: Abnormal   Collection Time: 10/29/21  1:28 PM  Result Value Ref Range   Hemoglobin A1C 6.6 (A) 4.0 - 5.6 %   HbA1c POC (<> result, manual entry)     HbA1c, POC (prediabetic range)     HbA1c, POC (controlled diabetic range)      PHQ2/9:    12/05/2021   11:20 AM 10/29/2021    1:27 PM 06/12/2021    9:19 AM 05/24/2021    7:50 AM 03/26/2021    9:44 AM  Depression screen PHQ 2/9  Decreased Interest 0 0 0 0 0  Down, Depressed, Hopeless 0 0 0 0 0  PHQ - 2 Score 0 0 0 0 0  Altered sleeping 0 0 0 0 0  Tired, decreased energy 0 0 0 0 0  Change in appetite 0 0 0 0 0  Feeling bad or failure about yourself  0 0 0 0 0  Trouble concentrating 0 0 0 0 0  Moving slowly or fidgety/restless 0 0 0 0 0  Suicidal thoughts 0 0 0 0 0  PHQ-9 Score 0 0 0 0 0  Difficult doing work/chores   Not difficult at all      phq 9 is negative   Fall Risk:    12/05/2021   11:20 AM 10/29/2021    1:27 PM 06/12/2021    9:19 AM 05/24/2021    7:50 AM 03/26/2021    9:44 AM  Fall Risk   Falls in the past year? 0 0 0 0 0  Number falls in past yr: 0 0 0 0 0  Injury with Fall? 0 0 0 0 0  Risk for fall due to : No Fall Risks No Fall Risks  No Fall Risks No Fall Risks  Follow up Falls prevention discussed Falls prevention discussed  Falls prevention discussed Falls prevention discussed      Functional Status Survey: Is the  patient deaf or have difficulty hearing?: No Does the patient have difficulty seeing, even when wearing glasses/contacts?: Yes Does the patient have difficulty concentrating, remembering, or making decisions?: No Does the patient have difficulty walking or climbing stairs?: No Does the patient have difficulty dressing or bathing?: No Does the patient have difficulty doing errands alone such as  visiting a doctor's office or shopping?: No    Assessment & Plan  1. Sore throat  - Novel Coronavirus, NAA ([facility])  2. Acute cough  - Novel Coronavirus, NAA ([facility]) - Budeson-Glycopyrrol-Formoterol (BREZTRI AEROSPHERE) 160-9-4.8 MCG/ACT AERO; Inhale 2 puffs into the lungs 2 (two) times daily.  Dispense: 10.7 g; Refill: 0 - benzonatate (TESSALON) 100 MG capsule; Take 1-2 capsules (100-200 mg total) by mouth 3 (three) times daily as needed for cough.  Dispense: 40 capsule; Refill: 0

## 2021-12-06 LAB — NOVEL CORONAVIRUS, NAA: SARS-CoV-2, NAA: DETECTED — AB

## 2021-12-06 LAB — SPECIMEN STATUS REPORT

## 2021-12-07 ENCOUNTER — Ambulatory Visit: Payer: Self-pay | Admitting: *Deleted

## 2021-12-07 NOTE — Telephone Encounter (Signed)
Patient reports she is retuning a call to practice. Pt given lab results per notes of K. Sowles, MD on 12/07/21. Pt verbalized understanding.  Patient would like to know if she is cleared to go back to work. Recommended patient ask her employer regarding their guidelines for returning to work. If work note needed please call back.

## 2021-12-10 ENCOUNTER — Ambulatory Visit: Payer: Self-pay

## 2021-12-10 ENCOUNTER — Other Ambulatory Visit: Payer: Self-pay

## 2021-12-10 ENCOUNTER — Encounter: Payer: Self-pay | Admitting: Nurse Practitioner

## 2021-12-10 ENCOUNTER — Other Ambulatory Visit (HOSPITAL_COMMUNITY)
Admission: RE | Admit: 2021-12-10 | Discharge: 2021-12-10 | Disposition: A | Discharge status: Home / Self Care | Payer: Federal, State, Local not specified - PPO | Source: Ambulatory Visit | Attending: Nurse Practitioner | Admitting: Nurse Practitioner

## 2021-12-10 ENCOUNTER — Ambulatory Visit: Payer: Federal, State, Local not specified - PPO | Admitting: Nurse Practitioner

## 2021-12-10 VITALS — BP 126/72 | HR 95 | Temp 98.1°F | Resp 18 | Ht 63.0 in | Wt 153.2 lb

## 2021-12-10 DIAGNOSIS — E1165 Type 2 diabetes mellitus with hyperglycemia: Secondary | ICD-10-CM | POA: Diagnosis not present

## 2021-12-10 DIAGNOSIS — R350 Frequency of micturition: Secondary | ICD-10-CM | POA: Insufficient documentation

## 2021-12-10 DIAGNOSIS — B3731 Acute candidiasis of vulva and vagina: Secondary | ICD-10-CM | POA: Insufficient documentation

## 2021-12-10 DIAGNOSIS — N898 Other specified noninflammatory disorders of vagina: Secondary | ICD-10-CM | POA: Diagnosis not present

## 2021-12-10 DIAGNOSIS — N760 Acute vaginitis: Secondary | ICD-10-CM | POA: Insufficient documentation

## 2021-12-10 LAB — POCT URINALYSIS DIPSTICK
Bilirubin, UA: NEGATIVE
Blood, UA: NEGATIVE
Glucose, UA: POSITIVE — AB
Ketones, UA: NEGATIVE
Leukocytes, UA: NEGATIVE
Nitrite, UA: NEGATIVE
Protein, UA: POSITIVE — AB
Spec Grav, UA: 1.02 (ref 1.010–1.025)
Urobilinogen, UA: 0.2 E.U./dL
pH, UA: 5 (ref 5.0–8.0)

## 2021-12-10 MED ORDER — METFORMIN HCL ER 750 MG PO TB24: 750.0000 mg | ORAL_TABLET | Freq: Every day | ORAL | 1 refills | Status: DC

## 2021-12-10 MED ORDER — GLIPIZIDE ER 2.5 MG PO TB24: 2.5000 mg | ORAL_TABLET | Freq: Every day | ORAL | 1 refills | Status: DC

## 2021-12-10 MED ORDER — FLUCONAZOLE 150 MG PO TABS: 150.0000 mg | ORAL_TABLET | ORAL | 0 refills | Status: DC | PRN

## 2021-12-10 NOTE — Assessment & Plan Note (Signed)
Stop taking Xigduo.  Start taking metformin 750 mg twice daily and glipizide 2.5 mg daily.  Continue to monitor blood sugar.  Keep follow-up appointment with Dr. Ancil Boozer.

## 2021-12-10 NOTE — Telephone Encounter (Signed)
    Chief Complaint: Vaginal itching, pain. More internal. No discharge.Asking for medication for yeast infection. Symptoms: Above Frequency: 3 days ago Pertinent Negatives: Patient denies  Disposition: '[]'$ ED /'[]'$ Urgent Care (no appt availability in office) / '[]'$ Appointment(In office/virtual)/ '[]'$  [place] [facility]/ '[]'$ Home Care/ '[]'$ Refused Recommended Disposition /'[]'$ [place] [facility]/ '[x]'$  Follow-up with PCP Additional Notes: Please advise pt. [phone].   Answer Assessment - Initial Assessment Questions 1. SYMPTOM: "What's the main symptom you're concerned about?" (e.g., pain, itching, dryness)     Itching. burning 2. LOCATION: "Where is the   located?" (e.g., inside/outside, left/right)     Inside 3. ONSET: "When did the    start?"     3 days ago 4. PAIN: "Is there any pain?" If Yes, ask: "How bad is it?" (Scale: 1-10; mild, moderate, severe)   -  MILD (1-3): Doesn't interfere with normal activities.    -  MODERATE (4-7): Interferes with normal activities (e.g., work or school) or awakens from sleep.     -  SEVERE (8-10): Excruciating pain, unable to do any normal activities.     Moderate 5. ITCHING: "Is there any itching?" If Yes, ask: "How bad is it?" (Scale: 1-10; mild, moderate, severe)     Moderate 6. CAUSE: "What do you think is causing the discharge?" "Have you had the same problem before? What happened then?"     Yeast 7. OTHER SYMPTOMS: "Do you have any other symptoms?" (e.g., fever, itching, vaginal bleeding, pain with urination, injury to genital area, vaginal foreign body)     Saw blood when wiping x 1, urinary frequency 8. PREGNANCY: "Is there any chance you are pregnant?" "When was your last menstrual period?"     No  Protocols used: Vaginal Symptoms-A-AH

## 2021-12-10 NOTE — Telephone Encounter (Signed)
Summary: possible yeast infection with itching and burning.   Pt stated she is experiencing possible yeast infection with itching and burning.    Pt stated when she wiped on Saturday, she saw blood in her urine once.  Pt denied abdominal pain.    Pt seeking clinical advice       Called pt - unable to LM, mailbox is full.

## 2021-12-10 NOTE — Progress Notes (Signed)
BP 126/72   Pulse 95   Temp 98.1 F (36.7 C) (Oral)   Resp 18   Ht '5\' 3"'$  (1.6 m)   Wt 153 lb 3.2 oz (69.5 kg)   SpO2 98%   BMI 27.14 kg/m    Subjective:    Patient ID: Valerie Manning, female    DOB: 23-Feb-1966, 56 y.o.   MRN: [id number]  HPI: Valerie Manning is a 56 y.o. female  Chief Complaint  Patient presents with   Urinary Tract Infection    Burning, itching   Vaginitis   Urinary frequency and vaginal itching: Patient states that symptoms started about 3 days ago.  She says that she feels like she is been going to the bathroom more frequently.  She also states she has some vaginal itching.  Patient denies any vaginal discharge, dysuria or fever.  Patient was recently put on a SGLT2.  Discussed with patient her symptoms are likely due to that.  Urine dip showed no signs of infection.  We will send for urine culture.  Vaginal swab obtained we will treat according to results.  Test with patient likely yeast due to the SGLT2 medication.  We will send in prescription for Diflucan.  Discussed with patient increasing fluid intake.   Weight loss: Patient was recently diagnosed with Covid on 12/05/2021.  Patient reports she has been losing weight.  Her current weight is 153 lbs, her weight on 12/05/2021 was 165 pounds.  Discussed with patient that weight loss is likely due to her recent COVID infection and not having much of an appetite.  She is get a monitor her weight loss and report back at her next follow-up appointment Dr. Ancil Boozer.  Diabetes: Patient's last A1c was 6.6 on 10/29/2021.  Patient was changed from metformin and glipizide to Xigduo.  Patient would like to switch back to her original medication.  Prescription sent in for glipizide XL 2.5 mg daily and metformin 750 mg twice daily.  Patient reports her blood sugars have been running anywhere from 180-120.   Relevant past medical, surgical, family and social history reviewed and updated as indicated. Interim medical history since our  last visit reviewed. Allergies and medications reviewed and updated.  Review of Systems  Constitutional: Negative for fever or weight change.  Respiratory: Negative for cough and shortness of breath.   Cardiovascular: Negative for chest pain or palpitations.  Gastrointestinal: Negative for abdominal pain, no bowel changes. GU: Positive for vaginal itching, urinary frequency Musculoskeletal: Negative for gait problem or joint swelling.  Skin: Negative for rash.  Neurological: Negative for dizziness or headache.  No other specific complaints in a complete review of systems (except as listed in HPI above).      Objective:    BP 126/72   Pulse 95   Temp 98.1 F (36.7 C) (Oral)   Resp 18   Ht '5\' 3"'$  (1.6 m)   Wt 153 lb 3.2 oz (69.5 kg)   SpO2 98%   BMI 27.14 kg/m   Wt Readings from Last 3 Encounters:  12/10/21 153 lb 3.2 oz (69.5 kg)  12/05/21 165 lb (74.8 kg)  10/29/21 165 lb (74.8 kg)    Physical Exam  Constitutional: Patient appears well-developed and well-nourished. Obese  No distress.  HEENT: head atraumatic, normocephalic, pupils equal and reactive to light, neck supple Cardiovascular: Normal rate, regular rhythm and normal heart sounds.  No murmur heard. No BLE edema. Pulmonary/Chest: Effort normal and breath sounds normal. No respiratory distress. Abdominal:  Soft.  There is no tenderness.  No CVA tenderness Psychiatric: Patient has a normal mood and affect. behavior is normal. Judgment and thought content normal.  Results for orders placed or performed in visit on 12/10/21  POCT urinalysis dipstick  Result Value Ref Range   Color, UA gold    Clarity, UA clear    Glucose, UA Positive (A) Negative   Bilirubin, UA negative    Ketones, UA negative    Spec Grav, UA 1.020 1.010 - 1.025   Blood, UA negative    pH, UA 5.0 5.0 - 8.0   Protein, UA Positive (A) Negative   Urobilinogen, UA 0.2 0.2 or 1.0 E.U./dL   Nitrite, UA negative    Leukocytes, UA Negative Negative    Appearance clear    Odor none       Assessment & Plan:   Problem List Items Addressed This Visit       Endocrine   Type 2 diabetes mellitus with hyperglycemia, without long-term current use of insulin ([place])    Stop taking Xigduo.  Start taking metformin 750 mg twice daily and glipizide 2.5 mg daily.  Continue to monitor blood sugar.  Keep follow-up appointment with Dr. Ancil Boozer.      Relevant Medications   glipiZIDE (GLIPIZIDE XL) 2.5 MG 24 hr tablet   metFORMIN (GLUCOPHAGE-XR) 750 MG 24 hr tablet   Other Visit Diagnoses     Urinary frequency    -  Primary   Urine dip, urine culture and vaginal swab obtained.  No sign of urinary tract infection will treat for yeast.  Push fluids   Relevant Orders   POCT urinalysis dipstick (Completed)   Urine Culture   Cervicovaginal ancillary only   Vaginal itching       Urine dip, urine culture and vaginal swab obtained.  No sign of urinary tract infection will treat for yeast.  Push fluids   Relevant Medications   fluconazole (DIFLUCAN) 150 MG tablet        Follow up plan: Return for Has follow-up appointment with Dr. Ancil Boozer already scheduled.

## 2021-12-11 ENCOUNTER — Telehealth: Payer: Self-pay | Admitting: Family Medicine

## 2021-12-11 LAB — URINE CULTURE
MICRO NUMBER:: 13776031
Result:: NO GROWTH
SPECIMEN QUALITY:: ADEQUATE

## 2021-12-11 NOTE — Telephone Encounter (Signed)
Copied from [place] [id number]. Topic: General - Inquiry >> Dec 11, 2021 10:23 AM Marcellus Scott wrote: Reason for CRM: Jeanine from [facility] stated swab that was sent had a hole on top of the swab was punctured.  Jeanine stated this needs to be recollected.

## 2021-12-11 NOTE — Telephone Encounter (Signed)
Patient notified and will come back for specimen

## 2021-12-11 NOTE — Telephone Encounter (Signed)
Called [employer] full.

## 2021-12-11 NOTE — Telephone Encounter (Unsigned)
Copied from [place] [id number]. Topic: General - Other >> Dec 11, 2021 12:06 PM Tiffany B wrote: Please reference 12/10/2021 Nurse Triage message: Patient returning call

## 2021-12-11 NOTE — Telephone Encounter (Signed)
Patient states glipiZIDE (GLIPIZIDE XL) 2.5 MG 24 hr tablet sent in on 12/10/2021 should be 5 MG not 2.5 MG and would like a new script sent to [place], [place] Phone:  [phone]  Fax:  [phone]

## 2021-12-12 NOTE — Telephone Encounter (Signed)
Attempted to reach patient for clarification but her voicemail was full and no answer.

## 2021-12-13 ENCOUNTER — Other Ambulatory Visit: Payer: Self-pay | Admitting: Nurse Practitioner

## 2021-12-13 DIAGNOSIS — B9689 Other specified bacterial agents as the cause of diseases classified elsewhere: Secondary | ICD-10-CM

## 2021-12-13 LAB — CERVICOVAGINAL ANCILLARY ONLY
Bacterial Vaginitis (gardnerella): POSITIVE — AB
Candida Glabrata: NEGATIVE
Candida Vaginitis: POSITIVE — AB
Chlamydia: NEGATIVE
Comment: NEGATIVE
Comment: NEGATIVE
Comment: NEGATIVE
Comment: NEGATIVE
Comment: NEGATIVE
Comment: NORMAL
Neisseria Gonorrhea: NEGATIVE
Trichomonas: NEGATIVE

## 2021-12-13 MED ORDER — METRONIDAZOLE 500 MG PO TABS: 500.0000 mg | ORAL_TABLET | Freq: Two times a day (BID) | ORAL | 0 refills | Status: AC

## 2022-01-27 ENCOUNTER — Other Ambulatory Visit: Payer: Self-pay | Admitting: Family Medicine

## 2022-01-27 DIAGNOSIS — E785 Hyperlipidemia, unspecified: Secondary | ICD-10-CM

## 2022-01-27 DIAGNOSIS — E1169 Type 2 diabetes mellitus with other specified complication: Secondary | ICD-10-CM

## 2022-03-05 ENCOUNTER — Ambulatory Visit: Payer: Federal, State, Local not specified - PPO | Admitting: Family Medicine

## 2022-03-14 DIAGNOSIS — M25561 Pain in right knee: Secondary | ICD-10-CM | POA: Diagnosis not present

## 2022-03-14 DIAGNOSIS — M1711 Unilateral primary osteoarthritis, right knee: Secondary | ICD-10-CM | POA: Diagnosis not present

## 2022-03-14 DIAGNOSIS — E119 Type 2 diabetes mellitus without complications: Secondary | ICD-10-CM | POA: Diagnosis not present

## 2022-03-27 ENCOUNTER — Other Ambulatory Visit: Payer: Self-pay | Admitting: Family Medicine

## 2022-03-27 DIAGNOSIS — E039 Hypothyroidism, unspecified: Secondary | ICD-10-CM

## 2022-03-28 NOTE — Progress Notes (Signed)
Name: Valerie Manning   MRN: [id number]    DOB: November 01, 1965   Date:03/29/2022       Progress Note  Subjective  Chief Complaint  Follow Up  HPI  HTN: bp is at goal , no chest pain, palpitation or dizziness. She is  compliant with medication. 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 Roland Rack and is going for follow up. She has some swelling and intermittent pain, no redness or increase in warmth   Hypothyroidism: history of goiter treated with radioactive  iodine, she used to see  Dr. Rosario Jacks, taking Synthroid 150 mcg daily and TSH has been at goal for years. Denies dysphagia, dry skin or change in bowel movements. We will recheck level this Fall     DMII:  She denies polyphagia, polydipsia or polyuria..  She has been taking atorvastatin for dyslipidemia. Last LDL was at goal at 55 , no side effects of medication. She was on glipizide and Metformin but asked to eliminate some of her pills so  we switched to xigduo but caused urinary frequency, she is back on glicpizide and metformin but having some late afternoon hypoglycemic episodes. A1C is at goal at 6.5 %, we will switch to Actos    Hyperlipidemia: taking atorvastatin, denies myalgia we will recheck labs today   History of iron deficiency anemia: last levels improved, denies pica We will recheck last today    Patient Active Problem List   Diagnosis Date Noted   Carpal tunnel syndrome, right 10/29/2021   Hypertension associated with type 2 diabetes mellitus ([place]) 10/29/2021   Hypertension, benign 10/29/2021   Adhesive capsulitis of left shoulder 05/12/2019   Lipoma of neck 02/27/2016   Status post abdominal hysterectomy and left salpingo-oophorectomy 10/25/2015   Acquired hypothyroidism 03/13/2015   History of iron deficiency anemia 03/13/2015    Hypertension 08/15/2014   Type 2 diabetes mellitus with hyperglycemia, without long-term current use of insulin ([place]) 05/31/2014   GERD (gastroesophageal reflux disease) 05/31/2014   Hyperlipidemia 05/31/2014    Past Surgical History:  Procedure Laterality Date   ABDOMINAL HYSTERECTOMY  08/10/2013   pathology report contains cervix   BREAST BIOPSY Right 05/2015   Pathology revealed stromal fibrosis with intra atrophic benign x2 areas   BREAST BIOPSY Right 09/13/2016   Procedure: BREAST BIOPSY WITH NEEDLE LOCALIZATION;  Surgeon: [name], MD;  Location: [facility] ORS;  Service: General;  Laterality: Right;   BREAST EXCISIONAL BIOPSY Right 06/16/2016   lumpectomy complext sclerosing lesion, benign    Family History  Problem Relation Age of Onset   Diabetes Mother    Diabetes Sister    Heart disease Sister    Diabetes Sister    Diabetes Sister    Thyroid disease Sister    Diabetes Sister    Thyroid disease Sister    Thyroid disease Sister    Heart disease Father    Cancer Neg Hx    Breast cancer Neg Hx     Social History   Tobacco Use   Smoking status: Never   Smokeless tobacco: Never  Substance Use Topics   Alcohol use: No    Alcohol/week: 0.0 standard drinks of alcohol     Current Outpatient Medications:    aspirin EC 81 MG tablet, Take 81 mg by mouth daily. , Disp: , Rfl:  blood glucose meter kit and supplies, Dispense based on patient and insurance preference. Use up to four times daily as directed. (FOR ICD-10 E10.9, E11.9)., Disp: 1 each, Rfl: 0   glucose blood (ONETOUCH VERIO) test strip, Use as instructed, Disp: 100 each, Rfl: 12   metFORMIN (GLUCOPHAGE-XR) 750 MG 24 hr tablet, Take 1 tablet (750 mg total) by mouth daily with breakfast., Disp: 180 tablet, Rfl: 1   Multiple Vitamin (MULTIVITAMIN) capsule, Take 1 capsule by mouth daily., Disp: , Rfl:    pioglitazone (ACTOS) 15 MG tablet, Take 1 tablet (15 mg total) by mouth daily., Disp: 30 tablet, Rfl:  3   atorvastatin (LIPITOR) 20 MG tablet, Take 1 tablet (20 mg total) by mouth daily., Disp: 30 tablet, Rfl: 3   diclofenac Sodium (VOLTAREN) 1 % GEL, Apply 2 g topically 4 (four) times daily. (Patient not taking: Reported on 03/29/2022), Disp: 100 g, Rfl: 0   levothyroxine (SYNTHROID) 150 MCG tablet, TAKE 1 TABLET(150 MCG) BY MOUTH DAILY BEFORE BREAKFAST, Disp: 30 tablet, Rfl: 0   telmisartan-hydrochlorothiazide (MICARDIS HCT) 80-25 MG tablet, Take 1 tablet by mouth daily., Disp: 30 tablet, Rfl: 3  No Known Allergies  I personally reviewed active problem list, medication list, allergies, family history, social history, health maintenance with the patient/caregiver today.   ROS  Constitutional: Negative for fever or weight change.  Respiratory: Negative for cough and shortness of breath.   Cardiovascular: Negative for chest pain or palpitations.  Gastrointestinal: Negative for abdominal pain, no bowel changes.  Musculoskeletal: positive  for gait problem or joint swelling.  Skin: Negative for rash.  Neurological: Negative for dizziness or headache.  No other specific complaints in a complete review of systems (except as listed in HPI above).   Objective  Vitals:   03/29/22 1029  BP: 122/70  Pulse: 86  Resp: 16  Temp: 98.1 F (36.7 C)  TempSrc: Oral  SpO2: 99%  Weight: 158 lb 6.4 oz (71.8 kg)  Height: _0  (1.626 m)    Body mass index is 27.19 kg/m.  Physical Exam  Constitutional: Patient appears well-developed and well-nourished.  No distress.  HEENT: head atraumatic, normocephalic, pupils equal and reactive to light, neck supple Cardiovascular: Normal rate, regular rhythm and normal heart sounds.  No murmur heard. No BLE edema. Pulmonary/Chest: Effort normal and breath sounds normal. No respiratory distress. Abdominal: Soft.  There is no tenderness. Psychiatric: Patient has a normal mood and affect. behavior is normal. Judgment and thought content normal.   Recent  Results (from the past 2160 hour(s))  POCT HgB A1C     Status: Abnormal   Collection Time: 03/29/22 10:32 AM  Result Value Ref Range   Hemoglobin A1C 6.6 (A) 4.0 - 5.6 %   HbA1c POC (<> result, manual entry)     HbA1c, POC (prediabetic range)     HbA1c, POC (controlled diabetic range)      Diabetic Foot Exam: Diabetic Foot Exam - Simple   Simple Foot Form Visual Inspection No deformities, no ulcerations, no other skin breakdown bilaterally: Yes Sensation Testing Intact to touch and monofilament testing bilaterally: Yes Pulse Check Posterior Tibialis and Dorsalis pulse intact bilaterally: Yes Comments      PHQ2/9:    03/29/2022   10:31 AM 12/10/2021    1:25 PM 12/05/2021   11:20 AM 10/29/2021    1:27 PM 06/12/2021    9:19 AM  Depression screen PHQ 2/9  Decreased Interest 0 0 0 0 0  Down, Depressed, Hopeless 0 0  0 0 0  PHQ - 2 Score 0 0 0 0 0  Altered sleeping 0  0 0 0  Tired, decreased energy 0  0 0 0  Change in appetite 0  0 0 0  Feeling bad or failure about yourself  0  0 0 0  Trouble concentrating 0  0 0 0  Moving slowly or fidgety/restless 0  0 0 0  Suicidal thoughts 0  0 0 0  PHQ-9 Score 0  0 0 0  Difficult doing work/chores     Not difficult at all    phq 9 is negative   Fall Risk:    03/29/2022   10:31 AM 12/10/2021    1:24 PM 12/05/2021   11:20 AM 10/29/2021    1:27 PM 06/12/2021    9:19 AM  Fall Risk   Falls in the past year? 0 0 0 0 0  Number falls in past yr:  0 0 0 0  Injury with Fall?  0 0 0 0  Risk for fall due to : History of fall(s)  No Fall Risks No Fall Risks   Follow up Falls prevention discussed;Falls evaluation completed;Education provided Falls evaluation completed Falls prevention discussed Falls prevention discussed       Functional Status Survey: Is the patient deaf or have difficulty hearing?: No Does the patient have difficulty seeing, even when wearing glasses/contacts?: No Does the patient have difficulty concentrating, remembering,  or making decisions?: No Does the patient have difficulty walking or climbing stairs?: No Does the patient have difficulty dressing or bathing?: No Does the patient have difficulty doing errands alone such as visiting a doctor's office or shopping?: No    Assessment & Plan  1. Dyslipidemia associated with type 2 diabetes mellitus (HCC)  - POCT HgB A1C - HM Diabetes Foot Exam - Urine Microalbumin w/creat. ratio - atorvastatin (LIPITOR) 20 MG tablet; Take 1 tablet (20 mg total) by mouth daily.  Dispense: 30 tablet; Refill: 3 - pioglitazone (ACTOS) 15 MG tablet; Take 1 tablet (15 mg total) by mouth daily.  Dispense: 30 tablet; Refill: 3 - blood glucose meter kit and supplies; Dispense based on patient and insurance preference. Use up to four times daily as directed. (FOR ICD-10 E10.9, E11.9).  Dispense: 1 each; Refill: 0  2. Hypertension associated with type 2 diabetes mellitus (HCC)  - POCT HgB A1C - HM Diabetes Foot Exam - Urine Microalbumin w/creat. ratio - COMPLETE METABOLIC PANEL WITH GFR - telmisartan-hydrochlorothiazide (MICARDIS HCT) 80-25 MG tablet; Take 1 tablet by mouth daily.  Dispense: 30 tablet; Refill: 3  3. Acquired hypothyroidism  - TSH - levothyroxine (SYNTHROID) 150 MCG tablet; TAKE 1 TABLET(150 MCG) BY MOUTH DAILY BEFORE BREAKFAST  Dispense: 30 tablet; Refill: 0  4. Encounter for screening for HIV  - HIV Antibody (routine testing w rflx)  5. History of iron deficiency anemia  - CBC with Differential/Platelet - Iron, TIBC and Ferritin Panel  6. Hypertension, benign  - telmisartan-hydrochlorothiazide (MICARDIS HCT) 80-25 MG tablet; Take 1 tablet by mouth daily.  Dispense: 30 tablet; Refill: 3  7. Dyslipidemia  - atorvastatin (LIPITOR) 20 MG tablet; Take 1 tablet (20 mg total) by mouth daily.  Dispense: 30 tablet; Refill: 3 - Lipid panel

## 2022-03-28 NOTE — Telephone Encounter (Signed)
Requested medications are due for refill today.  yes  Requested medications are on the active medications list.  yes  Last refill. 06/25/2021 #90 2 rf  Future visit scheduled.   yes  Notes to clinic.  Labs are expired    Requested Prescriptions  Pending Prescriptions Disp Refills   levothyroxine (SYNTHROID) 150 MCG tablet [Pharmacy Med Name: LEVOTHYROXINE 0.'150MG'$  (150MCG) TAB] 90 tablet 2    Sig: TAKE 1 TABLET(150 MCG) BY MOUTH DAILY BEFORE BREAKFAST     Endocrinology:  Hypothyroid Agents Failed - 03/27/2022  6:59 PM      Failed - TSH in normal range and within 360 days    TSH  Date Value Ref Range Status  09/20/2020 0.94 0.40 - 4.50 mIU/L Final         Passed - Valid encounter within last 12 months    Recent Outpatient Visits           3 months ago Urinary frequency   [place], FNP   3 months ago Sore throat   [facility], Drue Stager, MD   5 months ago Dyslipidemia associated with type 2 diabetes mellitus [facility])   [facility] Steele Sizer, MD   9 months ago Upper respiratory tract infection, unspecified type   [facility] Teodora Medici, DO   10 months ago Well adult exam   [facility] Steele Sizer, MD       Future Appointments             Tomorrow Steele Sizer, MD [facility], [facility]

## 2022-03-29 ENCOUNTER — Ambulatory Visit (INDEPENDENT_AMBULATORY_CARE_PROVIDER_SITE_OTHER): Payer: Federal, State, Local not specified - PPO | Admitting: Family Medicine

## 2022-03-29 ENCOUNTER — Encounter: Payer: Self-pay | Admitting: Family Medicine

## 2022-03-29 VITALS — BP 122/70 | HR 86 | Temp 98.1°F | Resp 16 | Ht 64.0 in | Wt 158.4 lb

## 2022-03-29 DIAGNOSIS — E1169 Type 2 diabetes mellitus with other specified complication: Secondary | ICD-10-CM | POA: Diagnosis not present

## 2022-03-29 DIAGNOSIS — I10 Essential (primary) hypertension: Secondary | ICD-10-CM

## 2022-03-29 DIAGNOSIS — E785 Hyperlipidemia, unspecified: Secondary | ICD-10-CM | POA: Diagnosis not present

## 2022-03-29 DIAGNOSIS — E1159 Type 2 diabetes mellitus with other circulatory complications: Secondary | ICD-10-CM

## 2022-03-29 DIAGNOSIS — I152 Hypertension secondary to endocrine disorders: Secondary | ICD-10-CM | POA: Diagnosis not present

## 2022-03-29 DIAGNOSIS — Z114 Encounter for screening for human immunodeficiency virus [HIV]: Secondary | ICD-10-CM

## 2022-03-29 DIAGNOSIS — E039 Hypothyroidism, unspecified: Secondary | ICD-10-CM

## 2022-03-29 DIAGNOSIS — E1165 Type 2 diabetes mellitus with hyperglycemia: Secondary | ICD-10-CM

## 2022-03-29 DIAGNOSIS — Z862 Personal history of diseases of the blood and blood-forming organs and certain disorders involving the immune mechanism: Secondary | ICD-10-CM

## 2022-03-29 LAB — POCT GLYCOSYLATED HEMOGLOBIN (HGB A1C): Hemoglobin A1C: 6.6 % — AB (ref 4.0–5.6)

## 2022-03-29 MED ORDER — LEVOTHYROXINE SODIUM 150 MCG PO TABS: ORAL_TABLET | ORAL | 0 refills | Status: DC

## 2022-03-29 MED ORDER — PIOGLITAZONE HCL 15 MG PO TABS: 15.0000 mg | ORAL_TABLET | Freq: Every day | ORAL | 3 refills | Status: DC

## 2022-03-29 MED ORDER — ATORVASTATIN CALCIUM 20 MG PO TABS
20.0000 mg | ORAL_TABLET | Freq: Every day | ORAL | 3 refills | Status: DC
Start: 2022-03-29 — End: 2022-07-30

## 2022-03-29 MED ORDER — BLOOD GLUCOSE METER KIT: PACK | 0 refills | Status: AC

## 2022-03-29 MED ORDER — TELMISARTAN-HCTZ 80-25 MG PO TABS: 1.0000 | ORAL_TABLET | Freq: Every day | ORAL | 3 refills | Status: DC

## 2022-03-30 LAB — LIPID PANEL
Cholesterol: 153 mg/dL (ref <200)
HDL: 75 mg/dL (ref >=50)
LDL Cholesterol (Calc): 60 mg/dL (calc)
Non-HDL Cholesterol (Calc): 78 mg/dL (calc) (ref <130)
Total CHOL/HDL Ratio: 2 (calc) (ref <5.0)
Triglycerides: 94 mg/dL (ref <150)

## 2022-03-30 LAB — CBC WITH DIFFERENTIAL/PLATELET
Absolute Monocytes: 219 cells/uL (ref 200–950)
Basophils Absolute: 22 cells/uL (ref 0–200)
Basophils Relative: 0.5 %
Eosinophils Absolute: 90 cells/uL (ref 15–500)
Eosinophils Relative: 2.1 %
HCT: 43.5 % (ref 35.0–45.0)
Hemoglobin: 14.2 g/dL (ref 11.7–15.5)
Lymphs Abs: 1754 cells/uL (ref 850–3900)
MCH: 25.2 pg — ABNORMAL LOW (ref 27.0–33.0)
MCHC: 32.6 g/dL (ref 32.0–36.0)
MCV: 77.3 fL — ABNORMAL LOW (ref 80.0–100.0)
MPV: 12.8 fL — ABNORMAL HIGH (ref 7.5–12.5)
Monocytes Relative: 5.1 %
Neutro Abs: 2215 cells/uL (ref 1500–7800)
Neutrophils Relative %: 51.5 %
Platelets: 265 10*3/uL (ref 140–400)
RBC: 5.63 10*6/uL — ABNORMAL HIGH (ref 3.80–5.10)
RDW: 14.5 % (ref 11.0–15.0)
Total Lymphocyte: 40.8 %
WBC: 4.3 10*3/uL (ref 3.8–10.8)

## 2022-03-30 LAB — COMPLETE METABOLIC PANEL WITH GFR
AG Ratio: 1.4 (calc) (ref 1.0–2.5)
ALT: 16 U/L (ref 6–29)
AST: 13 U/L (ref 10–35)
Albumin: 4.3 g/dL (ref 3.6–5.1)
Alkaline phosphatase (APISO): 86 U/L (ref 37–153)
BUN: 14 mg/dL (ref 7–25)
CO2: 28 mmol/L (ref 20–32)
Calcium: 10 mg/dL (ref 8.6–10.4)
Chloride: 99 mmol/L (ref 98–110)
Creat: 0.61 mg/dL (ref 0.50–1.03)
Globulin: 3 g/dL (calc) (ref 1.9–3.7)
Glucose, Bld: 165 mg/dL — ABNORMAL HIGH (ref 65–99)
Potassium: 3.8 mmol/L (ref 3.5–5.3)
Sodium: 139 mmol/L (ref 135–146)
Total Bilirubin: 0.5 mg/dL (ref 0.2–1.2)
Total Protein: 7.3 g/dL (ref 6.1–8.1)
eGFR: 104 mL/min/{1.73_m2} (ref >=60)

## 2022-03-30 LAB — MICROALBUMIN / CREATININE URINE RATIO
Creatinine, Urine: 94 mg/dL (ref 20–275)
Microalb Creat Ratio: 4 mcg/mg creat (ref <30)
Microalb, Ur: 0.4 mg/dL

## 2022-03-30 LAB — IRON,TIBC AND FERRITIN PANEL
%SAT: 37 % (calc) (ref 16–45)
Ferritin: 75 ng/mL (ref 16–232)
Iron: 108 ug/dL (ref 45–160)
TIBC: 289 mcg/dL (calc) (ref 250–450)

## 2022-03-30 LAB — HIV ANTIBODY (ROUTINE TESTING W REFLEX): HIV 1&2 Ab, 4th Generation: NONREACTIVE

## 2022-03-30 LAB — TSH: TSH: 0.39 mIU/L — ABNORMAL LOW (ref 0.40–4.50)

## 2022-04-24 DIAGNOSIS — M1711 Unilateral primary osteoarthritis, right knee: Secondary | ICD-10-CM | POA: Diagnosis not present

## 2022-04-30 ENCOUNTER — Telehealth: Payer: Self-pay | Admitting: Family Medicine

## 2022-04-30 ENCOUNTER — Other Ambulatory Visit: Payer: Self-pay | Admitting: Family Medicine

## 2022-04-30 ENCOUNTER — Ambulatory Visit: Payer: Self-pay

## 2022-04-30 ENCOUNTER — Other Ambulatory Visit: Payer: Self-pay

## 2022-04-30 DIAGNOSIS — E039 Hypothyroidism, unspecified: Secondary | ICD-10-CM

## 2022-04-30 DIAGNOSIS — B370 Candidal stomatitis: Secondary | ICD-10-CM

## 2022-04-30 MED ORDER — LEVOTHYROXINE SODIUM 150 MCG PO TABS: ORAL_TABLET | ORAL | 0 refills | Status: DC

## 2022-04-30 MED ORDER — NYSTATIN 100000 UNIT/ML MT SUSP: 5.0000 mL | Freq: Four times a day (QID) | OROMUCOSAL | 0 refills | Status: DC

## 2022-04-30 NOTE — Telephone Encounter (Signed)
Updated and sent rx to [facility]. Patient notified

## 2022-04-30 NOTE — Telephone Encounter (Signed)
Summary: mouth sore w/ white coating on tongue   Pt states her mouth is sore and has a white coating on her tongue.  Would like to know if Dr Ancil Boozer will call her magic mouthwash to her pharmacy?  Pt states this has been going on 4-5 days .  Please advise      Called pt - [email or web address]

## 2022-04-30 NOTE — Telephone Encounter (Signed)
Pt called to see if a script for Magic mouth wash was sent to pharmacy / she stated she wanted this to go to [facility] / please advise

## 2022-04-30 NOTE — Telephone Encounter (Signed)
  Chief Complaint: white patches on tongue- tongue painful Symptoms: white patches on tongue, tongue painful Frequency: 4-5 days Pertinent Negatives: Patient denies , fever, sore throat, toothache, swelling  Disposition: '[]'$ ED /'[]'$ Urgent Care (no appt availability in office) / '[]'$ Appointment(In office/virtual)/ '[]'$  [place] [facility]/ '[]'$ Home Care/ '[x]'$ Refused Recommended Disposition /'[]'$ [place] [facility]/ '[]'$  Follow-up with PCP Additional Notes: Patient states she was just in office at beginning of month- declines offer of appointment for symptom- patient states she has had this before and requesting magic mouthwash Rx  Reason for Disposition  [1] White patches that stick to tongue or inner cheek AND [2] can be wiped off  Answer Assessment - Initial Assessment Questions 1. SYMPTOM: "What's the main symptom you're concerned about?" (e.g., chapped lips, dry mouth, lump, sores)     White patches in mouth- sore tongue 2. ONSET: "When did the  sore  start?"     4-5 days 3. PAIN: "Is there any pain?" If Yes, ask: "How bad is it?" (Scale: 1-10; mild, moderate, severe)   - MILD (1-3):  doesn't interfere with eating or normal activities   - MODERATE (4-7): interferes with eating some solids and normal activities   - SEVERE (8-10):  excruciating pain, interferes with most normal activities   - SEVERE DYSPHAGIA: can't swallow liquids, drooling     mild 4. CAUSE: "What do you think is causing the symptoms?"     Possible yeast- patient has had tis in past 5. OTHER SYMPTOMS: "Do you have any other symptoms?" (e.g., fever, sore throat, toothache, swelling)     no  Protocols used: Mouth Symptoms-A-AH

## 2022-05-02 ENCOUNTER — Telehealth: Payer: Self-pay | Admitting: Family Medicine

## 2022-05-02 ENCOUNTER — Other Ambulatory Visit: Payer: Self-pay | Admitting: Family Medicine

## 2022-05-02 DIAGNOSIS — E039 Hypothyroidism, unspecified: Secondary | ICD-10-CM

## 2022-05-02 MED ORDER — LEVOTHYROXINE SODIUM 150 MCG PO TABS: ORAL_TABLET | ORAL | 0 refills | Status: DC

## 2022-05-02 NOTE — Telephone Encounter (Signed)
See TE from 1.2.24

## 2022-05-02 NOTE — Telephone Encounter (Signed)
Pt called and stated she is waiting on increased dose of Levothyroxine / she stated she wanted the increased dose and will recheck in 6 weeks / please advise and send to  [facility], [place]    The magic mouth was was not covered by [facility] insurance [facility] and the [facility] may have still had it in their system so it was showing that she filled it / pt stated that she no longer needs the mouth wash and it can be cancelled from [facility] and [facility] / please advise

## 2022-05-02 NOTE — Telephone Encounter (Signed)
Pt daughter called back to follow up on the medication Levothyroxine request to increase the dose. Pt daughter is requesting a call back and would like to speak with a nurse in regards to dose increase.  Please advise.

## 2022-05-03 NOTE — Telephone Encounter (Signed)
Returned call, advised Dr. Ancil Boozer had already spoken to them and they had no further questions or concerns to express at this time.

## 2022-05-28 DIAGNOSIS — E039 Hypothyroidism, unspecified: Secondary | ICD-10-CM | POA: Diagnosis not present

## 2022-05-29 ENCOUNTER — Other Ambulatory Visit: Payer: Self-pay | Admitting: Family Medicine

## 2022-05-29 DIAGNOSIS — E039 Hypothyroidism, unspecified: Secondary | ICD-10-CM

## 2022-05-29 LAB — TSH: TSH: 0.68 mIU/L (ref 0.40–4.50)

## 2022-05-29 MED ORDER — LEVOTHYROXINE SODIUM 150 MCG PO TABS: ORAL_TABLET | ORAL | 1 refills | Status: DC

## 2022-05-30 ENCOUNTER — Telehealth: Payer: Self-pay | Admitting: Family Medicine

## 2022-05-30 NOTE — Telephone Encounter (Signed)
Copied from [place] [id number]. Topic: General - Other >> May 30, 2022  9:10 AM Valerie Manning wrote: Reason for CRM: The patient would like to be contacted by a member of clinical staff to discuss their lab results from 05/28/22  Please contact further when possible

## 2022-05-30 NOTE — Telephone Encounter (Signed)
Spoke to patient and let her know tsh lab was normal and Dr Ancil Boozer recommended continuation of current therapy. Patient verbalized understanding and had to questions or concerns to express at this time.

## 2022-06-16 ENCOUNTER — Other Ambulatory Visit: Payer: Self-pay | Admitting: Internal Medicine

## 2022-06-16 DIAGNOSIS — E1165 Type 2 diabetes mellitus with hyperglycemia: Secondary | ICD-10-CM

## 2022-06-18 NOTE — Telephone Encounter (Signed)
Future visit in 1 month , Requested Prescriptions  Pending Prescriptions Disp Refills   metFORMIN (GLUCOPHAGE-XR) 750 MG 24 hr tablet [Pharmacy Med Name: METFORMIN ER 750MG 24HR TABS] 180 tablet 1    Sig: TAKE 2 TABLETS(1500 MG) BY MOUTH DAILY WITH BREAKFAST     Endocrinology:  Diabetes - Biguanides Failed - 06/16/2022  8:29 PM      Failed - B12 Level in normal range and within 720 days    No results found for: "VITAMINB12"       Passed - Cr in normal range and within 360 days    Creat  Date Value Ref Range Status  03/29/2022 0.61 0.50 - 1.03 mg/dL Final   Creatinine, Urine  Date Value Ref Range Status  03/29/2022 94 20 - 275 mg/dL Final         Passed - HBA1C is between 0 and 7.9 and within 180 days    Hemoglobin A1C  Date Value Ref Range Status  03/29/2022 6.6 (A) 4.0 - 5.6 % Final  05/19/2017 6.5  Final   HbA1c, POC (controlled diabetic range)  Date Value Ref Range Status  05/28/2019 6.5 0.0 - 7.0 % Final         Passed - eGFR in normal range and within 360 days    GFR, Est African American  Date Value Ref Range Status  02/07/2020 115 > OR = 60 mL/min/1.72m Final   GFR, Est Non African American  Date Value Ref Range Status  02/07/2020 99 > OR = 60 mL/min/1.773mFinal   eGFR  Date Value Ref Range Status  03/29/2022 104 > OR = 60 mL/min/1.7320minal         Passed - Valid encounter within last 6 months    Recent Outpatient Visits           2 months ago Dyslipidemia associated with type 2 diabetes mellitus [facility]   6 months ago [facility]   6 months ago [facility]   7 months [facility] with [facility]   1 year ago Upper respiratory tract infection, unspecified type   [facility]       Future Appointments             In 1 month SowSteele SizerD [facility] normal limits and completed in the last 12 months    WBC  Date Value Ref Range Status  03/29/2022 4.3 3.8 - 10.8 Thousand/uL Final   RBC  Date Value Ref Range Status  03/29/2022 5.63 (H) 3.80 - 5.10 Million/uL Final   Hemoglobin  Date Value Ref Range Status  03/29/2022 14.2 11.7 - 15.5 g/dL Final   HGB  Date Value Ref Range Status  09/15/2013 14.7 12.0 - 16.0 g/dL Final   HCT  Date Value Ref Range Status  03/29/2022 43.5 35.0 - 45.0 % Final  09/15/2013 45.6 35.0 - 47.0 % Final   MCHC  Date Value Ref Range Status  03/29/2022 32.6 32.0 - 36.0 g/dL Final   [facility] Value Ref Range Status  03/29/2022 25.2 (L) 27.0 - 33.0  pg Final   MCV  Date Value Ref Range Status  03/29/2022 77.3 (L) 80.0 - 100.0 fL Final  09/15/2013 81 80 - 100 fL Final   No results found for: "PLTCOUNTKUC", "LABPLAT", "POCPLA" RDW  Date Value Ref Range Status  03/29/2022 14.5 11.0 - 15.0 % Final  09/15/2013 15.3 (H) 11.5 - 14.5 % Final

## 2022-07-29 NOTE — Progress Notes (Unsigned)
Name: Valerie Manning   MRN: [id number]    DOB: May 17, 1965   Date:07/30/2022       Progress Note  Subjective  Chief Complaint  Follow Up  HPI  HTN: bp is at goal , no chest pain, palpitation or dizziness. She is  compliant with medication. 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 Roland Rack , she is doing well, no longer having pain or swelling, she has some OA but not having symptoms  Hypothyroidism: history of goiter treated with radioactive  iodine, she used to see  Dr. Rosario Jacks, taking Synthroid 150 mcg daily and TSH has been at goal for years, it was suppressed in Dec but normalized without changing dose . Denies dysphagia, dry skin or change in bowel movements.     DMII:  She denies polyphagia, polydipsia or polyuria.  She has been taking atorvastatin for dyslipidemia. Last LDL was at goal at 60 . She was on glipizide, Actos and Metformin. She could not tolerate SGL-2 agonist - it caused yeast infections. Tolerating current regiment without hypoglycemia or side effects. A1C is up a little from 6.6 % to 6.9 % , she will try to resume a healthier diet    Hyperlipidemia: taking atorvastatin, Last LDL at goal at 60 and no side effects of medications   History of iron deficiency anemia: Labs checked Dec 2023 and no anemia and ferritin was normal at 75    Patient Active Problem List   Diagnosis Date Noted   Carpal tunnel syndrome, right 10/29/2021   Hypertension associated with type 2 diabetes mellitus 10/29/2021   Hypertension, benign 10/29/2021   Adhesive capsulitis of left shoulder 05/12/2019   Lipoma of neck 02/27/2016   Status post abdominal hysterectomy and left salpingo-oophorectomy 10/25/2015   Acquired hypothyroidism 03/13/2015   History of iron deficiency anemia 03/13/2015   Hypertension 08/15/2014    Type 2 diabetes mellitus with hyperglycemia, without long-term current use of insulin 05/31/2014   GERD (gastroesophageal reflux disease) 05/31/2014   Hyperlipidemia 05/31/2014    Past Surgical History:  Procedure Laterality Date   ABDOMINAL HYSTERECTOMY  08/10/2013   pathology report contains cervix   BREAST BIOPSY Right 05/2015   Pathology revealed stromal fibrosis with intra atrophic benign x2 areas   BREAST BIOPSY Right 09/13/2016   Procedure: BREAST BIOPSY WITH NEEDLE LOCALIZATION;  Surgeon: [name], MD;  Location: [facility] ORS;  Service: General;  Laterality: Right;   BREAST EXCISIONAL BIOPSY Right 06/16/2016   lumpectomy complext sclerosing lesion, benign    Family History  Problem Relation Age of Onset   Diabetes Mother    Diabetes Sister    Heart disease Sister    Diabetes Sister    Diabetes Sister    Thyroid disease Sister    Diabetes Sister    Thyroid disease Sister    Thyroid disease Sister    Heart disease Father    Cancer Neg Hx    Breast cancer Neg Hx     Social History   Tobacco Use   Smoking status: Never   Smokeless tobacco: Never  Substance Use Topics   Alcohol use: No    Alcohol/week: 0.0 standard drinks of alcohol     Current Outpatient Medications:    aspirin EC 81 MG tablet, Take 81 mg by mouth daily. , Disp: ,  Rfl:    atorvastatin (LIPITOR) 20 MG tablet, Take 1 tablet (20 mg total) by mouth daily., Disp: 30 tablet, Rfl: 3   blood glucose meter kit and supplies, Dispense based on patient and insurance preference. Use up to four times daily as directed. (FOR ICD-10 E10.9, E11.9)., Disp: 1 each, Rfl: 0   glucose blood (ONETOUCH VERIO) test strip, Use as instructed, Disp: 100 each, Rfl: 12   levothyroxine (SYNTHROID) 150 MCG tablet, TAKE 1 TABLET(150 MCG) BY MOUTH DAILY BEFORE BREAKFAST, Disp: 90 tablet, Rfl: 1   metFORMIN (GLUCOPHAGE-XR) 750 MG 24 hr tablet, TAKE 2 TABLETS(1500 MG) BY MOUTH DAILY WITH BREAKFAST, Disp: 180 tablet, Rfl:  1   Multiple Vitamin (MULTIVITAMIN) capsule, Take 1 capsule by mouth daily., Disp: , Rfl:    pioglitazone (ACTOS) 15 MG tablet, Take 1 tablet (15 mg total) by mouth daily., Disp: 30 tablet, Rfl: 3   telmisartan-hydrochlorothiazide (MICARDIS HCT) 80-25 MG tablet, Take 1 tablet by mouth daily., Disp: 30 tablet, Rfl: 3   diclofenac Sodium (VOLTAREN) 1 % GEL, Apply 2 g topically 4 (four) times daily. (Patient not taking: Reported on 03/29/2022), Disp: 100 g, Rfl: 0   nystatin (MYCOSTATIN) 100000 UNIT/ML suspension, Take 5 mLs (500,000 Units total) by mouth 4 (four) times daily. Swish and spit (Patient not taking: Reported on 07/30/2022), Disp: 60 mL, Rfl: 0  No Known Allergies  I personally reviewed active problem list, medication list, allergies, family history, social history, health maintenance with the patient/caregiver today.   ROS  Constitutional: Negative for fever or weight change.  Respiratory: Negative for cough and shortness of breath.   Cardiovascular: Negative for chest pain or palpitations.  Gastrointestinal: Negative for abdominal pain, no bowel changes.  Musculoskeletal: Negative for gait problem or joint swelling.  Skin: Negative for rash.  Neurological: Negative for dizziness or headache.  No other specific complaints in a complete review of systems (except as listed in HPI above).   Objective  Vitals:   07/30/22 0921  BP: 130/82  Pulse: 89  Resp: 16  SpO2: 99%  Weight: 163 lb (73.9 kg)  Height: 5\' 3"  (1.6 m)    Body mass index is 28.87 kg/m.  Physical Exam  Constitutional: Patient appears well-developed and well-nourished.  No distress.  HEENT: head atraumatic, normocephalic, pupils equal and reactive to light, neck supple Cardiovascular: Normal rate, regular rhythm and normal heart sounds.  No murmur heard. No BLE edema. Pulmonary/Chest: Effort normal and breath sounds normal. No respiratory distress. Abdominal: Soft.  There is no tenderness. Psychiatric:  Patient has a normal mood and affect. behavior is normal. Judgment and thought content normal.   Recent Results (from the past 2160 hour(s))  TSH     Status: None   Collection Time: 05/28/22  3:19 PM  Result Value Ref Range   TSH 0.68 0.40 - 4.50 mIU/L    PHQ2/9:    07/30/2022    9:22 AM 03/29/2022   10:31 AM 12/10/2021    1:25 PM 12/05/2021   11:20 AM 10/29/2021    1:27 PM  Depression screen PHQ 2/9  Decreased Interest 0 0 0 0 0  Down, Depressed, Hopeless 0 0 0 0 0  PHQ - 2 Score 0 0 0 0 0  Altered sleeping 0 0  0 0  Tired, decreased energy 0 0  0 0  Change in appetite 0 0  0 0  Feeling bad or failure about yourself  0 0  0 0  Trouble concentrating 0 0  0 0  Moving slowly or fidgety/restless 0 0  0 0  Suicidal thoughts 0 0  0 0  PHQ-9 Score 0 0  0 0    phq 9 is negative   Fall Risk:    07/30/2022    9:22 AM 03/29/2022   10:31 AM 12/10/2021    1:24 PM 12/05/2021   11:20 AM 10/29/2021    1:27 PM  Fall Risk   Falls in the past year? 0 0 0 0 0  Number falls in past yr: 0  0 0 0  Injury with Fall? 0  0 0 0  Risk for fall due to : No Fall Risks History of fall(s)  No Fall Risks No Fall Risks  Follow up Falls prevention discussed Falls prevention discussed;Falls evaluation completed;Education provided Falls evaluation completed Falls prevention discussed Falls prevention discussed      Functional Status Survey: Is the patient deaf or have difficulty hearing?: No Does the patient have difficulty seeing, even when wearing glasses/contacts?: Yes Does the patient have difficulty concentrating, remembering, or making decisions?: No Does the patient have difficulty walking or climbing stairs?: No Does the patient have difficulty dressing or bathing?: No Does the patient have difficulty doing errands alone such as visiting a doctor's office or shopping?: No    Assessment & Plan  1. Dyslipidemia associated with type 2 diabetes mellitus  - POCT HgB A1C - atorvastatin (LIPITOR) 20  MG tablet; Take 1 tablet (20 mg total) by mouth daily.  Dispense: 30 tablet; Refill: 3 - pioglitazone (ACTOS) 15 MG tablet; Take 1 tablet (15 mg total) by mouth daily.  Dispense: 30 tablet; Refill: 3 - metFORMIN (GLUCOPHAGE-XR) 750 MG 24 hr tablet; Take 2 tablets (1,500 mg total) by mouth daily with breakfast.  Dispense: 60 tablet; Refill: 3  2. Dyslipidemia  - atorvastatin (LIPITOR) 20 MG tablet; Take 1 tablet (20 mg total) by mouth daily.  Dispense: 30 tablet; Refill: 3  3. Hypertension, benign  - telmisartan-hydrochlorothiazide (MICARDIS HCT) 80-25 MG tablet; Take 1 tablet by mouth daily.  Dispense: 30 tablet; Refill: 3  4. Hypertension associated with type 2 diabetes mellitus  - telmisartan-hydrochlorothiazide (MICARDIS HCT) 80-25 MG tablet; Take 1 tablet by mouth daily.  Dispense: 30 tablet; Refill: 3  5. Acquired hypothyroidism  - levothyroxine (SYNTHROID) 150 MCG tablet; TAKE 1 TABLET(150 MCG) BY MOUTH DAILY BEFORE BREAKFAST  Dispense: 30 tablet; Refill: 3

## 2022-07-30 ENCOUNTER — Encounter: Payer: Self-pay | Admitting: Family Medicine

## 2022-07-30 ENCOUNTER — Ambulatory Visit: Payer: Federal, State, Local not specified - PPO | Admitting: Family Medicine

## 2022-07-30 ENCOUNTER — Other Ambulatory Visit: Payer: Self-pay | Admitting: Family Medicine

## 2022-07-30 VITALS — BP 130/82 | HR 89 | Resp 16 | Ht 63.0 in | Wt 163.0 lb

## 2022-07-30 DIAGNOSIS — E039 Hypothyroidism, unspecified: Secondary | ICD-10-CM

## 2022-07-30 DIAGNOSIS — E785 Hyperlipidemia, unspecified: Secondary | ICD-10-CM | POA: Diagnosis not present

## 2022-07-30 DIAGNOSIS — E1159 Type 2 diabetes mellitus with other circulatory complications: Secondary | ICD-10-CM | POA: Diagnosis not present

## 2022-07-30 DIAGNOSIS — I10 Essential (primary) hypertension: Secondary | ICD-10-CM

## 2022-07-30 DIAGNOSIS — E1169 Type 2 diabetes mellitus with other specified complication: Secondary | ICD-10-CM | POA: Diagnosis not present

## 2022-07-30 DIAGNOSIS — I152 Hypertension secondary to endocrine disorders: Secondary | ICD-10-CM

## 2022-07-30 LAB — POCT GLYCOSYLATED HEMOGLOBIN (HGB A1C): Hemoglobin A1C: 6.9 % — AB (ref 4.0–5.6)

## 2022-07-30 MED ORDER — TELMISARTAN-HCTZ 80-25 MG PO TABS: 1.0000 | ORAL_TABLET | Freq: Every day | ORAL | 3 refills | Status: DC

## 2022-07-30 MED ORDER — PIOGLITAZONE HCL 15 MG PO TABS: 15.0000 mg | ORAL_TABLET | Freq: Every day | ORAL | 3 refills | Status: DC

## 2022-07-30 MED ORDER — LEVOTHYROXINE SODIUM 150 MCG PO TABS: ORAL_TABLET | ORAL | 3 refills | Status: DC

## 2022-07-30 MED ORDER — METFORMIN HCL ER 750 MG PO TB24
1500.0000 mg | ORAL_TABLET | Freq: Every day | ORAL | 3 refills | Status: DC
Start: 2022-07-30 — End: 2022-11-29

## 2022-07-30 MED ORDER — ATORVASTATIN CALCIUM 20 MG PO TABS: 20.0000 mg | ORAL_TABLET | Freq: Every day | ORAL | 3 refills | Status: DC

## 2022-08-26 ENCOUNTER — Ambulatory Visit (INDEPENDENT_AMBULATORY_CARE_PROVIDER_SITE_OTHER): Payer: Federal, State, Local not specified - PPO | Admitting: Family Medicine

## 2022-08-26 ENCOUNTER — Encounter: Payer: Self-pay | Admitting: Family Medicine

## 2022-08-26 VITALS — BP 126/80 | HR 93 | Temp 98.0°F | Resp 16 | Ht 64.0 in | Wt 166.2 lb

## 2022-08-26 DIAGNOSIS — J029 Acute pharyngitis, unspecified: Secondary | ICD-10-CM | POA: Diagnosis not present

## 2022-08-26 DIAGNOSIS — R052 Subacute cough: Secondary | ICD-10-CM | POA: Diagnosis not present

## 2022-08-26 DIAGNOSIS — J329 Chronic sinusitis, unspecified: Secondary | ICD-10-CM | POA: Diagnosis not present

## 2022-08-26 MED ORDER — PREDNISONE 20 MG PO TABS
40.0000 mg | ORAL_TABLET | Freq: Every day | ORAL | 0 refills | Status: AC
Start: 2022-08-26 — End: 2022-08-31

## 2022-08-26 MED ORDER — BENZONATATE 100 MG PO CAPS
100.0000 mg | ORAL_CAPSULE | Freq: Three times a day (TID) | ORAL | 0 refills | Status: DC | PRN
Start: 2022-08-26 — End: 2022-11-29

## 2022-08-26 MED ORDER — LORATADINE 10 MG PO TABS
10.0000 mg | ORAL_TABLET | Freq: Every day | ORAL | 11 refills | Status: DC
Start: 2022-08-26 — End: 2023-08-19

## 2022-08-26 NOTE — Progress Notes (Unsigned)
Patient ID: Valerie Manning, female    DOB: May 02, 1965, 57 y.o.   MRN: [id number]  PCP: Alba Cory, MD  Chief Complaint  Patient presents with   Cough   Sore Throat   Allergies    X3 weeks    Subjective:   Valerie Manning is a 57 y.o. female, presents to clinic with CC of the following:  HPI  PT presents for cough, sore throat and suspected allergies No hx per chart review of seasonal allergies Sx since the beginning of the month (April 5th)  She did test with health at work when sx started and covid was negative  She is losing voice, coughing fits in warm rooms/dry rooms She believes she's had some seasonal allergies that are very mild, this year has been very bad and her family members/children have seasonal allergies      Lab Results  Component Value Date   HGBA1C 6.9 (A) 07/30/2022      Patient Active Problem List   Diagnosis Date Noted   Carpal tunnel syndrome, right 10/29/2021   Hypertension associated with type 2 diabetes mellitus (HCC) 10/29/2021   Hypertension, benign 10/29/2021   Adhesive capsulitis of left shoulder 05/12/2019   Lipoma of neck 02/27/2016   Status post abdominal hysterectomy and left salpingo-oophorectomy 10/25/2015   Acquired hypothyroidism 03/13/2015   History of iron deficiency anemia 03/13/2015   Hypertension 08/15/2014   Type 2 diabetes mellitus with hyperglycemia, without long-term current use of insulin (HCC) 05/31/2014   GERD (gastroesophageal reflux disease) 05/31/2014   Hyperlipidemia 05/31/2014      Current Outpatient Medications:    aspirin EC 81 MG tablet, Take 81 mg by mouth daily. , Disp: , Rfl:    atorvastatin (LIPITOR) 20 MG tablet, Take 1 tablet (20 mg total) by mouth daily., Disp: 30 tablet, Rfl: 3   blood glucose meter kit and supplies, Dispense based on patient and insurance preference. Use up to four times daily as directed. (FOR ICD-10 E10.9, E11.9)., Disp: 1 each, Rfl: 0   glucose blood (ONETOUCH VERIO)  test strip, Use as instructed, Disp: 100 each, Rfl: 12   levothyroxine (SYNTHROID) 150 MCG tablet, TAKE 1 TABLET(150 MCG) BY MOUTH DAILY BEFORE BREAKFAST, Disp: 30 tablet, Rfl: 3   metFORMIN (GLUCOPHAGE-XR) 750 MG 24 hr tablet, Take 2 tablets (1,500 mg total) by mouth daily with breakfast., Disp: 60 tablet, Rfl: 3   Multiple Vitamin (MULTIVITAMIN) capsule, Take 1 capsule by mouth daily., Disp: , Rfl:    pioglitazone (ACTOS) 15 MG tablet, Take 1 tablet (15 mg total) by mouth daily., Disp: 30 tablet, Rfl: 3   telmisartan-hydrochlorothiazide (MICARDIS HCT) 80-25 MG tablet, Take 1 tablet by mouth daily., Disp: 30 tablet, Rfl: 3   No Known Allergies   Social History   Tobacco Use   Smoking status: Never   Smokeless tobacco: Never  Vaping Use   Vaping Use: Never used  Substance Use Topics   Alcohol use: No    Alcohol/week: 0.0 standard drinks of alcohol   Drug use: No      Chart Review Today: ***  Review of Systems     Objective:   Vitals:   08/26/22 1517  BP: 126/80  Pulse: 93  Resp: 16  Temp: 98 F (36.7 C)  TempSrc: Oral  SpO2: 98%  Weight: 166 lb 3.2 oz (75.4 kg)  Height: 5\' 4"  (1.626 m)    Body mass index is 28.53 kg/m.  Physical Exam   Results for orders  placed or performed in visit on 07/30/22  POCT HgB A1C  Result Value Ref Range   Hemoglobin A1C 6.9 (A) 4.0 - 5.6 %   HbA1c POC (<> result, manual entry)     HbA1c, POC (prediabetic range)     HbA1c, POC (controlled diabetic range)         Assessment & Plan:   ***     Danelle Berry, PA-C 08/26/22 3:26 PM

## 2022-08-26 NOTE — Patient Instructions (Addendum)
For bronchitis  Prednisone in the morning for 5 days  - you may not need this now - please let me or Dr. Carlynn Purl know if you get short of breath or whhezy  (Use albuterol inhaler as needed (will use more today and for the next few days, and then you should be able to use less 3 days from now))  Tessalon is a cough medicine you can use throughout the day as needed  Mucinex over the counter - it helps break up mucous in your chest.  You can get Mucinex or mucinex-D or -DM You may also try delsym or robitussin I will send in some meds for your nose and allergies - hopefully addressing that will help your symptoms You can ask the pharmacy staff help you find without other meds that can raise your blood pressure   Drink ample clear fluids  REST!  Take tylenol as needed for fever and/or aches  Cough may linger for a few weeks or sometimes up to 6 weeks, which is normal!    Please return to be rechecked if you are feeling worse at any point, or if you do not feel improved much in the next 1-2 weeks.  Concerning new or worsening symptoms include chest pain, shortness of breath, fever, coughing up blood, night sweats, unintentional weight loss, confusion, decreased urine.  Start a daily allergy med - zyrtec, claritin, allegra, xyzal or a combo of two is very safe. You can adjust or start nasal sprays - steroid intranasal sprays (flonase nasonex, nasocort), try saline nasal spray, or try adding an antihistamine nasal spray.     Allergic Rhinitis, Adult Allergic rhinitis is a reaction to allergens. Allergens are things that can cause an allergic reaction. This condition affects the lining inside the nose (mucous membrane). There are two types of allergic rhinitis: Seasonal. This type is also called hay fever. It happens only during some times of the year. Perennial. This type can happen at any time of the year. This condition cannot be spread from person to person (is not contagious). It can be  mild, worse, or very bad. It can develop at any age and may be outgrown. What are the causes? This condition may be caused by: Pollen from grasses, trees, and weeds. Dust mites. Smoke. Mold. Car fumes. The pee (urine), spit, or dander of pets. Dander is dead skin cells from a pet. What increases the risk? You are more likely to develop this condition if: You have allergies in your family. You have problems like allergies in your family. You may have: Swelling of parts of your eyes and eyelids. Asthma. This affects how you breathe. Long-term redness and swelling on your skin. Food allergies. What are the signs or symptoms? The main symptom of this condition is a runny or stuffy nose (nasal congestion). Other symptoms may include: Sneezing or coughing. Itching and tearing of your eyes. Mucus that drips down the back of your throat (postnasal drip). Trouble sleeping. Feeling tired. Headache. Sore throat. How is this treated? There is no cure for this condition. You should avoid things that you are allergic to. Treatment can help to relieve symptoms. This may include: Medicines that block allergy symptoms, such as corticosteroids or antihistamines. These may be given as a shot, nasal spray, or pill. Avoiding things you are allergic to. Medicines that give you bits of what you are allergic to over time. This is called immunotherapy. It is done if other treatments do not help. You may  get: Shots. Medicine under your tongue. Stronger medicines, if other treatments do not help. Follow these instructions at home: Avoiding allergens Find out what things you are allergic to and avoid them. To do this, try these things: If you get allergies any time of year: Replace carpet with wood, tile, or vinyl flooring. Carpet can trap pet dander and dust. Do not smoke. Do not allow smoking in your home. Change your heating and air conditioning filters at least once a month. If you get allergies  only some times of the year: Keep windows closed when you can. Plan things to do outside when pollen counts are lowest. Check pollen counts before you plan things to do outside. When you come indoors, change your clothes and shower before you sit on furniture or bedding. If you are allergic to a pet: Keep the pet out of your bedroom. Vacuum, sweep, and dust often.  General instructions Take over-the-counter and prescription medicines only as told by your doctor. Drink enough fluid to keep your pee (urine) pale yellow. Keep all follow-up visits as told by your doctor. This is important. Where to find more information American Academy of Allergy, Asthma & Immunology: www.aaaai.org Contact a doctor if: You have a fever. You get a cough that does not go away. You make whistling sounds when you breathe (wheeze). Your symptoms slow you down. Your symptoms stop you from doing your normal things each day. Get help right away if: You are short of breath. This symptom may be an emergency. Do not wait to see if the symptom will go away. Get medical help right away. Call your local emergency services (911 in the [place] Do not drive yourself to the hospital. Summary Allergic rhinitis may be treated by taking medicines and avoiding things you are allergic to. If you have allergies only some of the year, keep windows closed when you can at those times. Contact your doctor if you get a fever or a cough that does not go away. This information is not intended to replace advice given to you by your health care provider. Make sure you discuss any questions you have with your health care provider. Document Revised: 06/07/2019 Document Reviewed: 04/13/2019 Elsevier Patient Education  2023 [employer].

## 2022-11-24 ENCOUNTER — Other Ambulatory Visit: Payer: Self-pay | Admitting: Family Medicine

## 2022-11-24 DIAGNOSIS — E1169 Type 2 diabetes mellitus with other specified complication: Secondary | ICD-10-CM

## 2022-11-25 ENCOUNTER — Other Ambulatory Visit: Payer: Self-pay

## 2022-11-25 DIAGNOSIS — E1169 Type 2 diabetes mellitus with other specified complication: Secondary | ICD-10-CM

## 2022-11-25 MED ORDER — PIOGLITAZONE HCL 15 MG PO TABS
15.0000 mg | ORAL_TABLET | Freq: Every day | ORAL | 0 refills | Status: DC
Start: 2022-11-25 — End: 2022-11-29

## 2022-11-27 NOTE — Progress Notes (Signed)
Name: Valerie Manning   MRN: [id number]    DOB: [birth year]   Date:11/29/2022       Progress Note  Subjective  Chief Complaint  Follow up  HPI  HTN: bp is at goal , no chest pain, palpitation or dizziness. She is  compliant with medication. Continue Telmisartan hydrochlorothiazide 80/25 mg . No side effects   Right knee instability: she has a history of right knee fracture , she already contacted Dr. Joice Lofts , she is doing well, no longer having pain or swelling, she has some OA but not having symptoms at this time  Hypothyroidism: history of goiter treated with radioactive  iodine, she used to see  Dr. Dario Guardian, taking Synthroid 150 mcg daily and TSH has been at goal for years, it was suppressed in Dec but normalized without changing dose .  She is feeling well, no change in bowel movements or dry skin     DMII:  She denies polyphagia, polydipsia or polyuria.  She has been taking atorvastatin for dyslipidemia. Last LDL was at goal at 60 . She was on glipizide, Actos and Metformin. She could not tolerate SGL-2 agonist - it caused yeast infections. Tolerating current regiment without hypoglycemia or side effects. A1C is up a little from 6.6 % to 6.9 % and today is up to 7 %, she states drinking a little bit more sodas but will stop doing that again.    Hyperlipidemia: taking atorvastatin, Last LDL at goal at 60 and no side effects of medications , we will recheck levels next visit   History of iron deficiency anemia: Labs checked Dec 2023 and no anemia and ferritin was normal at 75 . Recheck it yearly   GERD: doing well, no symptoms lately   Patient Active Problem List   Diagnosis Date Noted   Dyslipidemia associated with type 2 diabetes mellitus (HCC) 11/29/2022   Carpal tunnel syndrome, right 10/29/2021   Hypertension associated with type 2 diabetes mellitus (HCC) 10/29/2021   Hypertension, benign 10/29/2021   Adhesive capsulitis of left shoulder 05/12/2019   Lipoma of neck 02/27/2016    Status post abdominal hysterectomy and left salpingo-oophorectomy 10/25/2015   Acquired hypothyroidism 03/13/2015   History of iron deficiency anemia 03/13/2015   Hypertension 08/15/2014   Type 2 diabetes mellitus with hyperglycemia, without long-term current use of insulin (HCC) 05/31/2014   GERD (gastroesophageal reflux disease) 05/31/2014   Hyperlipidemia 05/31/2014    Past Surgical History:  Procedure Laterality Date   ABDOMINAL HYSTERECTOMY  08/10/2013   pathology report contains cervix   BREAST BIOPSY Right 05/2015   Pathology revealed stromal fibrosis with intra atrophic benign x2 areas   BREAST BIOPSY Right 09/13/2016   Procedure: BREAST BIOPSY WITH NEEDLE LOCALIZATION;  Surgeon: Earline Mayotte, MD;  Location: [facility] ORS;  Service: General;  Laterality: Right;   BREAST EXCISIONAL BIOPSY Right 06/16/2016   lumpectomy complext sclerosing lesion, benign    Family History  Problem Relation Age of Onset   Diabetes Mother    Diabetes Sister    Heart disease Sister    Diabetes Sister    Diabetes Sister    Thyroid disease Sister    Diabetes Sister    Thyroid disease Sister    Thyroid disease Sister    Heart disease Father    Cancer Neg Hx    Breast cancer Neg Hx     Social History   Tobacco Use   Smoking status: Never   Smokeless tobacco: Never  Substance Use Topics  Alcohol use: No    Alcohol/week: 0.0 standard drinks of alcohol     Current Outpatient Medications:    blood glucose meter kit and supplies, Dispense based on patient and insurance preference. Use up to four times daily as directed. (FOR ICD-10 E10.9, E11.9)., Disp: 1 each, Rfl: 0   glucose blood (ONETOUCH VERIO) test strip, Use as instructed, Disp: 100 each, Rfl: 12   loratadine (CLARITIN) 10 MG tablet, Take 1 tablet (10 mg total) by mouth daily., Disp: 30 tablet, Rfl: 11   Multiple Vitamin (MULTIVITAMIN) capsule, Take 1 capsule by mouth daily., Disp: , Rfl:    atorvastatin (LIPITOR) 20 MG tablet,  Take 1 tablet (20 mg total) by mouth daily., Disp: 30 tablet, Rfl: 3   levothyroxine (SYNTHROID) 150 MCG tablet, TAKE 1 TABLET(150 MCG) BY MOUTH DAILY BEFORE BREAKFAST, Disp: 30 tablet, Rfl: 3   metFORMIN (GLUCOPHAGE-XR) 750 MG 24 hr tablet, Take 2 tablets (1,500 mg total) by mouth daily with breakfast., Disp: 60 tablet, Rfl: 3   pioglitazone (ACTOS) 15 MG tablet, Take 1 tablet (15 mg total) by mouth daily., Disp: 30 tablet, Rfl: 3   telmisartan-hydrochlorothiazide (MICARDIS HCT) 80-25 MG tablet, Take 1 tablet by mouth daily., Disp: 30 tablet, Rfl: 3  No Known Allergies  I personally reviewed active problem list, medication list, allergies, family history with the patient/caregiver today.   ROS  Constitutional: Negative for fever or weight change.  Respiratory: Negative for cough and shortness of breath.   Cardiovascular: Negative for chest pain or palpitations.  Gastrointestinal: Negative for abdominal pain, no bowel changes.  Musculoskeletal: Negative for gait problem or joint swelling.  Skin: Negative for rash.  Neurological: Negative for dizziness or headache.  No other specific complaints in a complete review of systems (except as listed in HPI above).   Objective  Vitals:   11/29/22 0838 11/29/22 0845  BP:  122/76  Pulse:  80  Resp: 16 14  Temp:  97.6 F (36.4 C)  TempSrc:  Oral  SpO2:  98%  Weight:  164 lb 6.4 oz (74.6 kg)  Height: 5\' 4"  (1.626 m) 5\' 4"  (1.626 m)    Body mass index is 28.22 kg/m.  Physical Exam  Constitutional: Patient appears well-developed and well-nourished. Obese  No distress.  HEENT: head atraumatic, normocephalic, pupils equal and reactive to light, neck supple, throat within normal limits Cardiovascular: Normal rate, regular rhythm and normal heart sounds.  No murmur heard. No BLE edema. Pulmonary/Chest: Effort normal and breath sounds normal. No respiratory distress. Abdominal: Soft.  There is no tenderness. Psychiatric: Patient has a  normal mood and affect. behavior is normal. Judgment and thought content normal.   Recent Results (from the past 2160 hour(s))  POCT HgB A1C     Status: Abnormal   Collection Time: 11/29/22  8:48 AM  Result Value Ref Range   Hemoglobin A1C 7.0 (A) 4.0 - 5.6 %   HbA1c POC (<> result, manual entry)     HbA1c, POC (prediabetic range)     HbA1c, POC (controlled diabetic range)       PHQ2/9:    11/29/2022    8:38 AM 07/30/2022    9:22 AM 03/29/2022   10:31 AM 12/10/2021    1:25 PM 12/05/2021   11:20 AM  Depression screen PHQ 2/9  Decreased Interest 0 0 0 0 0  Down, Depressed, Hopeless 0 0 0 0 0  PHQ - 2 Score 0 0 0 0 0  Altered sleeping 0 0 0  0  Tired, decreased energy 0 0 0  0  Change in appetite 0 0 0  0  Feeling bad or failure about yourself  0 0 0  0  Trouble concentrating 0 0 0  0  Moving slowly or fidgety/restless 0 0 0  0  Suicidal thoughts 0 0 0  0  PHQ-9 Score 0 0 0  0  Difficult doing work/chores Not difficult at all        phq 9 is negative   Fall Risk:    11/29/2022    8:38 AM 08/26/2022    3:19 PM 07/30/2022    9:22 AM 03/29/2022   10:31 AM 12/10/2021    1:24 PM  Fall Risk   Falls in the past year? 0 0 0 0 0  Number falls in past yr: 0  0  0  Injury with Fall? 0  0  0  Risk for fall due to : No Fall Risks No Fall Risks No Fall Risks History of fall(s)   Follow up Falls prevention discussed;Education provided;Falls evaluation completed Falls prevention discussed Falls prevention discussed Falls prevention discussed;Falls evaluation completed;Education provided Falls evaluation completed    Functional Status Survey: Is the patient deaf or have difficulty hearing?: No Does the patient have difficulty seeing, even when wearing glasses/contacts?: No Does the patient have difficulty concentrating, remembering, or making decisions?: No Does the patient have difficulty walking or climbing stairs?: No Does the patient have difficulty dressing or bathing?: No Does the  patient have difficulty doing errands alone such as visiting a doctor's office or shopping?: No    Assessment & Plan  1. Dyslipidemia associated with type 2 diabetes mellitus (HCC)  - POCT HgB A1C - atorvastatin (LIPITOR) 20 MG tablet; Take 1 tablet (20 mg total) by mouth daily.  Dispense: 30 tablet; Refill: 3 - metFORMIN (GLUCOPHAGE-XR) 750 MG 24 hr tablet; Take 2 tablets (1,500 mg total) by mouth daily with breakfast.  Dispense: 60 tablet; Refill: 3 - pioglitazone (ACTOS) 15 MG tablet; Take 1 tablet (15 mg total) by mouth daily.  Dispense: 30 tablet; Refill: 3  2. Acquired hypothyroidism  - levothyroxine (SYNTHROID) 150 MCG tablet; TAKE 1 TABLET(150 MCG) BY MOUTH DAILY BEFORE BREAKFAST  Dispense: 30 tablet; Refill: 3  3. Hypertension associated with type 2 diabetes mellitus (HCC)  - telmisartan-hydrochlorothiazide (MICARDIS HCT) 80-25 MG tablet; Take 1 tablet by mouth daily.  Dispense: 30 tablet; Refill: 3  4. Gastroesophageal reflux disease with esophagitis without hemorrhage  Resolved   5. Dyslipidemia  - atorvastatin (LIPITOR) 20 MG tablet; Take 1 tablet (20 mg total) by mouth daily.  Dispense: 30 tablet; Refill: 3  6. Hypertension, benign  - telmisartan-hydrochlorothiazide (MICARDIS HCT) 80-25 MG tablet; Take 1 tablet by mouth daily.  Dispense: 30 tablet; Refill: 3

## 2022-11-29 ENCOUNTER — Encounter: Payer: Self-pay | Admitting: Family Medicine

## 2022-11-29 ENCOUNTER — Ambulatory Visit (INDEPENDENT_AMBULATORY_CARE_PROVIDER_SITE_OTHER): Payer: Federal, State, Local not specified - PPO | Admitting: Family Medicine

## 2022-11-29 VITALS — BP 122/76 | HR 80 | Temp 97.6°F | Resp 14 | Ht 64.0 in | Wt 164.4 lb

## 2022-11-29 DIAGNOSIS — E785 Hyperlipidemia, unspecified: Secondary | ICD-10-CM

## 2022-11-29 DIAGNOSIS — E1165 Type 2 diabetes mellitus with hyperglycemia: Secondary | ICD-10-CM

## 2022-11-29 DIAGNOSIS — E039 Hypothyroidism, unspecified: Secondary | ICD-10-CM

## 2022-11-29 DIAGNOSIS — E1159 Type 2 diabetes mellitus with other circulatory complications: Secondary | ICD-10-CM | POA: Diagnosis not present

## 2022-11-29 DIAGNOSIS — Z7984 Long term (current) use of oral hypoglycemic drugs: Secondary | ICD-10-CM

## 2022-11-29 DIAGNOSIS — I10 Essential (primary) hypertension: Secondary | ICD-10-CM

## 2022-11-29 DIAGNOSIS — E1169 Type 2 diabetes mellitus with other specified complication: Secondary | ICD-10-CM | POA: Diagnosis not present

## 2022-11-29 DIAGNOSIS — K2100 Gastro-esophageal reflux disease with esophagitis, without bleeding: Secondary | ICD-10-CM

## 2022-11-29 DIAGNOSIS — I152 Hypertension secondary to endocrine disorders: Secondary | ICD-10-CM

## 2022-11-29 LAB — POCT GLYCOSYLATED HEMOGLOBIN (HGB A1C): Hemoglobin A1C: 7 % — AB (ref 4.0–5.6)

## 2022-11-29 MED ORDER — ATORVASTATIN CALCIUM 20 MG PO TABS
20.0000 mg | ORAL_TABLET | Freq: Every day | ORAL | 3 refills | Status: DC
Start: 2022-11-29 — End: 2023-04-18

## 2022-11-29 MED ORDER — PIOGLITAZONE HCL 15 MG PO TABS: 15.0000 mg | ORAL_TABLET | Freq: Every day | ORAL | 3 refills | Status: DC

## 2022-11-29 MED ORDER — TELMISARTAN-HCTZ 80-25 MG PO TABS
1.0000 | ORAL_TABLET | Freq: Every day | ORAL | 3 refills | Status: DC
Start: 2022-11-29 — End: 2023-04-18

## 2022-11-29 MED ORDER — METFORMIN HCL ER 750 MG PO TB24
1500.0000 mg | ORAL_TABLET | Freq: Every day | ORAL | 3 refills | Status: DC
Start: 2022-11-29 — End: 2023-04-18

## 2022-11-29 MED ORDER — LEVOTHYROXINE SODIUM 150 MCG PO TABS: ORAL_TABLET | ORAL | 3 refills | Status: DC

## 2022-12-11 DIAGNOSIS — H35033 Hypertensive retinopathy, bilateral: Secondary | ICD-10-CM | POA: Diagnosis not present

## 2023-01-09 ENCOUNTER — Ambulatory Visit: Payer: Federal, State, Local not specified - PPO | Attending: Family Medicine

## 2023-01-09 DIAGNOSIS — M79671 Pain in right foot: Secondary | ICD-10-CM | POA: Insufficient documentation

## 2023-01-09 NOTE — Therapy (Signed)
Patient Name: Valerie Manning MRN: [id number] DOB:1966-04-06, 57 y.o., female Today's Date: 01/09/2023  PT Screening Form   Time in  9:34 AM     Time out 9:54   Complaint: Patient complaining of right plantar (toe box and toes) - worse with walking/weight bearing- rates up to 8/10. States been working extra hours.  Past Medical Hx:  Diabetes type 2  Injury Date: approx 1 month ago Pain Scale: at rest = 0/10 and up to 8/10 with working/walking   Hx (this occurrence):   Patient reports right foot pain insidious onset 1 month ago. States aggravating pain- worse with working/walking. 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    Assessment:  (+) hammer type toe of right great toe Normal sensation in right plantar foot Normal ankle/Great toe/toe ROM *pain along 2nd plantar metatarsal region *min pain with palpation along 2nd plantar metarsal and worse with standing.  - STM(self) instructed with foot rolling on top of golf ball x 3 min with patient reporting feeling better Patient presents with Metatarsalgia type symptoms and PT recommended going to [facility] type store to obtain orthotics or OVC metatarsal pad to see if symptoms resolve. Also provided golf ball with instruction in how to perform self- Soft tissue mobilization for pain relief and foot/toe stretching. Instructed patient to try to incorporate rest and then ice after long work shift as needed and that if no improvement to discuss possible refer to podiatry from PCP. Patient verbalized good understanding of plan.    Recommendations:    Comments: See above assessment- Screen only- if no improvement instructed to follow up with PCP regarding referral to Podiatry.    []  Patient would benefit from an MD referral []  Patient would benefit from a full PT/OT/ SLP evaluation and treatment. [x]  No intervention recommended at this time.    Lenda Kelp, PT 01/09/2023, 2:05 PM

## 2023-01-27 ENCOUNTER — Ambulatory Visit: Payer: Self-pay | Admitting: *Deleted

## 2023-01-27 NOTE — Telephone Encounter (Signed)
Summary: Right foot pain, left leg/hip pain.   Pt called reporting that she has been in pain dealing with her right foot and her left leg/hip. She wants a podiatry referral. Would like to be seen but there is nothing soon enough  Best contact: [phone]         Chief Complaint: Foot pain Symptoms: Right foot pain "Ball of foot" Left knee pain Frequency: 1 month Pertinent Negatives: Patient denies injury Disposition: [] ED /[] Urgent Care (no appt availability in office) / [x] Appointment(In office/virtual)/ []  [facility]/ [] Home Care/ [] Refused Recommended Disposition /[] [facility]/ []  Follow-up with PCP Additional Notes:  Pt reports pain "Ball" of right foot.Left knee pain. Did have left hip pain but has resolved. Pt states P.T. at [employer] where she works gave her soft ball to freeze and use along with exercises to relive foot pain. Was advised to see podiatrist. Appt secured for Oct. 8th, first available, asking for earlier appt. Placed on wait list. Care advise provided, pt verbalizes understanding. Please advise if earlier appt comes available.  Reason for Disposition  [1] MODERATE pain (e.g., interferes with normal activities, limping) AND [2] present > 3 days  Answer Assessment - Initial Assessment Questions 1. ONSET: "When did the pain start?"      1 month 2. LOCATION: "Where is the pain located?"      Right foot, ball of foot, left knee pain 3. PAIN: "How bad is the pain?"    (Scale 1-10; or mild, moderate, severe)  - MILD (1-3): doesn't interfere with normal activities.   - MODERATE (4-7): interferes with normal activities (e.g., work or school) or awakens from sleep, limping.   - SEVERE (8-10): excruciating pain, unable to do any normal activities, unable to walk.      7-8/10. When walking worse 4. WORK OR EXERCISE: "Has there been any recent work or exercise that involved this part of the body?"      No 5. CAUSE: "What do you think is causing the foot  pain?"     Unsure 6. OTHER SYMPTOMS: "Do you have any other symptoms?" (e.g., leg pain, rash, fever, numbness)     Left knee pain.  Protocols used: Foot Pain-A-AH

## 2023-01-28 DIAGNOSIS — H40013 Open angle with borderline findings, low risk, bilateral: Secondary | ICD-10-CM | POA: Diagnosis not present

## 2023-02-03 ENCOUNTER — Ambulatory Visit: Payer: Federal, State, Local not specified - PPO | Admitting: Family Medicine

## 2023-02-07 ENCOUNTER — Encounter: Payer: Self-pay | Admitting: Nurse Practitioner

## 2023-02-07 ENCOUNTER — Other Ambulatory Visit: Payer: Self-pay

## 2023-02-07 ENCOUNTER — Ambulatory Visit (INDEPENDENT_AMBULATORY_CARE_PROVIDER_SITE_OTHER): Payer: Federal, State, Local not specified - PPO | Admitting: Nurse Practitioner

## 2023-02-07 VITALS — BP 124/82 | HR 84 | Temp 98.1°F | Resp 16 | Ht 64.0 in | Wt 164.7 lb

## 2023-02-07 DIAGNOSIS — G5761 Lesion of plantar nerve, right lower limb: Secondary | ICD-10-CM | POA: Diagnosis not present

## 2023-02-07 DIAGNOSIS — M79671 Pain in right foot: Secondary | ICD-10-CM | POA: Diagnosis not present

## 2023-02-07 NOTE — Progress Notes (Signed)
   BP 124/82   Pulse 84   Temp 98.1 F (36.7 C) (Oral)   Resp 16   Ht 5\' 4"  (1.626 m)   Wt 164 lb 11.2 oz (74.7 kg)   SpO2 99%   BMI 28.27 kg/m    Subjective:    Patient ID: Valerie Manning, female    DOB: 12/10/1965, 57 y.o.   MRN: [id number]  HPI: Valerie Manning is a 57 y.o. female  Chief Complaint  Patient presents with   Foot Pain    Right foot for 2 months   Right foot pain:  patient reports she started having right foot pain about two months ago. She describes the pain as painful.  She reports ice/ rest makes it better and walking makes it worse.  She says she has been using a frozen water bottle rolling it under her foot has helped. She does work on her feet and that makes it worse.  She denies any trauma.  Will place referral to podiatry. Continue to rest and use ice.   Relevant past medical, surgical, family and social history reviewed and updated as indicated. Interim medical history since our last visit reviewed. Allergies and medications reviewed and updated.  Review of Systems  Ten systems reviewed and is negative except as mentioned in HPI       Objective:    BP 124/82   Pulse 84   Temp 98.1 F (36.7 C) (Oral)   Resp 16   Ht 5\' 4"  (1.626 m)   Wt 164 lb 11.2 oz (74.7 kg)   SpO2 99%   BMI 28.27 kg/m   Wt Readings from Last 3 Encounters:  02/07/23 164 lb 11.2 oz (74.7 kg)  11/29/22 164 lb 6.4 oz (74.6 kg)  08/26/22 166 lb 3.2 oz (75.4 kg)    Physical Exam  Constitutional: Patient appears well-developed and well-nourished.  No distress.  HEENT: head atraumatic, normocephalic, pupils equal and reactive to light, neck supple, throat within normal limits Cardiovascular: Normal rate, regular rhythm and normal heart sounds.  No murmur heard. No BLE edema. Pulmonary/Chest: Effort normal and breath sounds normal. No respiratory distress. Abdominal: Soft.  There is no tenderness. Right Foot: tenderness by the third metacarpal  Psychiatric: Patient has a normal  mood and affect. behavior is normal. Judgment and thought content normal.   Results for orders placed or performed in visit on 11/29/22  POCT HgB A1C  Result Value Ref Range   Hemoglobin A1C 7.0 (A) 4.0 - 5.6 %   HbA1c POC (<> result, manual entry)     HbA1c, POC (prediabetic range)     HbA1c, POC (controlled diabetic range)        Assessment & Plan:   Problem List Items Addressed This Visit   None Visit Diagnoses     Right foot pain    -  Primary   continue to rest and ice, will refer to podiatry   Relevant Orders   Ambulatory referral to Podiatry   Morton's neuralgia, right       continue to rest and ice, will refer to podiatry        Follow up plan: Return if symptoms worsen or fail to improve.

## 2023-02-20 ENCOUNTER — Ambulatory Visit: Payer: Federal, State, Local not specified - PPO | Admitting: Podiatry

## 2023-02-20 ENCOUNTER — Encounter: Payer: Self-pay | Admitting: Podiatry

## 2023-02-20 DIAGNOSIS — M7751 Other enthesopathy of right foot: Secondary | ICD-10-CM

## 2023-02-20 NOTE — Progress Notes (Signed)
Subjective:  Patient ID: Valerie Manning, female    DOB: [birth year],  MRN: [id number]  Chief Complaint  Patient presents with   Foot Pain    "I have pain on the ball of my foot and it's sore underneath my toes."    57 y.o. female presents with the above complaint.  Patient presents for right second metatarsophalangeal joint pain painful to touch is progressive gotten worse worse with ambulation worse with pressure to discuss treatment options for this pain scale 7 of aching nature.  She does know where any kind of orthotics.   Review of Systems: Negative except as noted in the HPI. Denies N/V/F/Ch.  Past Medical History:  Diagnosis Date   Acquired hypothyroidism 03/13/2015   Cramp in muscle    H/O OF MUSCLE CRAMP IN CHEST AND STOMACH OCCAS WHEN BENDS AT WAIST. WHEN STANDS AND BENDS BACK HEARS POP. CRAMP GOES AWAY. HAS NOT HAPPENES IN MANY MONTHS   Diabetes mellitus without complication (HCC)    Dyslipidemia associated with type 2 diabetes mellitus (HCC) 05/31/2014   Hyperlipidemia    Hypertension    Left ear impacted cerumen 03/31/2017    Current Outpatient Medications:    atorvastatin (LIPITOR) 20 MG tablet, Take 1 tablet (20 mg total) by mouth daily., Disp: 30 tablet, Rfl: 3   blood glucose meter kit and supplies, Dispense based on patient and insurance preference. Use up to four times daily as directed. (FOR ICD-10 E10.9, E11.9)., Disp: 1 each, Rfl: 0   glucose blood (ONETOUCH VERIO) test strip, Use as instructed, Disp: 100 each, Rfl: 12   levothyroxine (SYNTHROID) 150 MCG tablet, TAKE 1 TABLET(150 MCG) BY MOUTH DAILY BEFORE BREAKFAST, Disp: 30 tablet, Rfl: 3   loratadine (CLARITIN) 10 MG tablet, Take 1 tablet (10 mg total) by mouth daily., Disp: 30 tablet, Rfl: 11   metFORMIN (GLUCOPHAGE-XR) 750 MG 24 hr tablet, Take 2 tablets (1,500 mg total) by mouth daily with breakfast., Disp: 60 tablet, Rfl: 3   Multiple Vitamin (MULTIVITAMIN) capsule, Take 1 capsule by mouth daily., Disp: ,  Rfl:    pioglitazone (ACTOS) 15 MG tablet, Take 1 tablet (15 mg total) by mouth daily., Disp: 30 tablet, Rfl: 3   telmisartan-hydrochlorothiazide (MICARDIS HCT) 80-25 MG tablet, Take 1 tablet by mouth daily., Disp: 30 tablet, Rfl: 3  Social History   Tobacco Use  Smoking Status Never  Smokeless Tobacco Never    No Known Allergies Objective:  There were no vitals filed for this visit. There is no height or weight on file to calculate BMI. Constitutional Well developed. Well nourished.  Vascular Dorsalis pedis pulses palpable bilaterally. Posterior tibial pulses palpable bilaterally. Capillary refill normal to all digits.  No cyanosis or clubbing noted. Pedal hair growth normal.  Neurologic Normal speech. Oriented to person, place, and time. Epicritic sensation to light touch grossly present bilaterally.  Dermatologic Nails well groomed and normal in appearance. No open wounds. No skin lesions.  Orthopedic: Pain on palpation of right second metatarsophalangeal joint pain with range of motion of the joint no deep intra-articular pain noted.  No flattening of the metatarsal head noted.  No crepitus noted.   Radiographs: None Assessment:   1. Capsulitis of metatarsophalangeal (MTP) joint of right foot    Plan:  Patient was evaluated and treated and all questions answered.  Right second MTP capsulitis -All questions and concerns were discussed with the patient extensive due to given the amount of pain that she is having she will benefit from steroid  injection help decrease inflammatory component surgical pain.  Patient agrees with plan like to proceed with steroid injection. -A steroid injection was performed at right second MTP using 1% plain Lidocaine and 10 mg of Kenalog. This was well tolerated.   No follow-ups on file.

## 2023-03-25 ENCOUNTER — Ambulatory Visit: Payer: Federal, State, Local not specified - PPO | Admitting: Podiatry

## 2023-03-25 NOTE — Progress Notes (Signed)
Name: Valerie Manning   MRN: [id number]    DOB: [birth year]   Date:03/31/2023       Progress Note  Subjective  Chief Complaint  Chief Complaint  Patient presents with   Annual Exam    HPI  Patient presents for annual CPE.  Diet: she was on a recent vacation and did not eat very healthy, but doing better overall  Exercise:  very active at home, maintains her yard  Last Eye Exam: up to date  Last Dental Exam: up to date  [employer] Visit from 02/07/2023 in [facility]  AUDIT-C Score 0      Depression: Phq 9 is  negative    03/31/2023    8:05 AM 02/07/2023    9:38 AM 11/29/2022    8:38 AM 07/30/2022    9:22 AM 03/29/2022   10:31 AM  Depression screen PHQ 2/9  Decreased Interest 0 0 0 0 0  Down, Depressed, Hopeless 0 0 0 0 0  PHQ - 2 Score 0 0 0 0 0  Altered sleeping 0  0 0 0  Tired, decreased energy 0  0 0 0  Change in appetite 0  0 0 0  Feeling bad or failure about yourself  0  0 0 0  Trouble concentrating 0  0 0 0  Moving slowly or fidgety/restless 0  0 0 0  Suicidal thoughts 0  0 0 0  PHQ-9 Score 0  0 0 0  Difficult doing work/chores Not difficult at all  Not difficult at all     Hypertension: BP Readings from Last 3 Encounters:  03/31/23 132/82  02/07/23 124/82  11/29/22 122/76   Obesity: Wt Readings from Last 3 Encounters:  03/31/23 165 lb 1.6 oz (74.9 kg)  02/07/23 164 lb 11.2 oz (74.7 kg)  11/29/22 164 lb 6.4 oz (74.6 kg)   BMI Readings from Last 3 Encounters:  03/31/23 28.34 kg/m  02/07/23 28.27 kg/m  11/29/22 28.22 kg/m     Vaccines:   Tdap: 09/20/20 Shingrix: complete Pneumonia: up to date  Flu: up to date  COVID-19:4 doses   Hep C Screening: Complete STD testing and prevention (HIV/chl/gon/syphilis)HIV:complete  Intimate partner violence: negative screen  Sexual History : no problems, one partner  Menstrual History/LMP/Abnormal Bleeding: post menopausal  Discussed importance of follow up if any  post-menopausal bleeding: yes  Incontinence Symptoms: negative for symptoms   Breast cancer:  - Last Mammogram: due /orders - BRCA gene screening: N/A  Osteoporosis Prevention : Discussed high calcium and vitamin D supplementation, weight bearing exercises Bone density :not applicable   Cervical cancer screening: Hysterectomy  Skin cancer: Discussed monitoring for atypical lesions  Colorectal cancer: due/ordered Lung cancer:  Low Dose CT Chest recommended if Age 42-80 years, 20 pack-year currently smoking OR have quit w/in 15years. Patient does not qualify for screen   ECG: 09/09/16  Advanced Care Planning: A voluntary discussion about advance care planning including the explanation and discussion of advance directives.  Discussed health care proxy and Living will, and the patient was able to identify a health care proxy as husband .  Patient does not have a living will and power of attorney of health care   Diabetic Foot Exam - Simple   Simple Foot Form Visual Inspection No deformities, no ulcerations, no other skin breakdown bilaterally: Yes Sensation Testing Intact to touch and monofilament testing bilaterally: Yes Pulse Check Posterior Tibialis and Dorsalis pulse intact bilaterally: Yes Comments  Lipids: Lab Results  Component Value Date   CHOL 153 03/29/2022   CHOL 145 03/26/2021   CHOL 133 02/07/2020   Lab Results  Component Value Date   HDL 75 03/29/2022   HDL 66 03/26/2021   HDL 65 02/07/2020   Lab Results  Component Value Date   LDLCALC 60 03/29/2022   LDLCALC 61 03/26/2021   LDLCALC 50 02/07/2020   Lab Results  Component Value Date   TRIG 94 03/29/2022   TRIG 93 03/26/2021   TRIG 92 02/07/2020   Lab Results  Component Value Date   CHOLHDL 2.0 03/29/2022   CHOLHDL 2.2 03/26/2021   CHOLHDL 2.0 02/07/2020   No results found for: "LDLDIRECT"  Glucose: Glucose  Date Value Ref Range Status  05/19/2014 186 (H) 65 - 99 mg/dL Final  05/01/7251 664  (H) 65 - 99 mg/dL Final  40/34/7425 956 (H) 65 - 99 mg/dL Final   Glucose, Bld  Date Value Ref Range Status  03/29/2022 165 (H) 65 - 99 mg/dL Final    Comment:    .            Fasting reference interval . For someone without known diabetes, a glucose value >125 mg/dL indicates that they may have diabetes and this should be confirmed with a follow-up test. .   02/07/2020 148 (H) 65 - 99 mg/dL Final    Comment:    .            Fasting reference interval . For someone without known diabetes, a glucose value >125 mg/dL indicates that they may have diabetes and this should be confirmed with a follow-up test. .   01/12/2019 82 65 - 99 mg/dL Final    Comment:    .            Fasting reference interval .    Glucose-Capillary  Date Value Ref Range Status  09/13/2016 145 (H) 65 - 99 mg/dL Final    Patient Active Problem List   Diagnosis Date Noted   Dyslipidemia associated with type 2 diabetes mellitus (HCC) 11/29/2022   Carpal tunnel syndrome, right 10/29/2021   Hypertension associated with type 2 diabetes mellitus (HCC) 10/29/2021   Hypertension, benign 10/29/2021   Adhesive capsulitis of left shoulder 05/12/2019   Lipoma of neck 02/27/2016   Status post abdominal hysterectomy and left salpingo-oophorectomy 10/25/2015   Acquired hypothyroidism 03/13/2015   History of iron deficiency anemia 03/13/2015   Hypertension 08/15/2014   Type 2 diabetes mellitus with hyperglycemia, without long-term current use of insulin (HCC) 05/31/2014   GERD (gastroesophageal reflux disease) 05/31/2014   Hyperlipidemia 05/31/2014    Past Surgical History:  Procedure Laterality Date   ABDOMINAL HYSTERECTOMY  08/10/2013   pathology report contains cervix   BREAST BIOPSY Right 05/2015   Pathology revealed stromal fibrosis with intra atrophic benign x2 areas   BREAST BIOPSY Right 09/13/2016   Procedure: BREAST BIOPSY WITH NEEDLE LOCALIZATION;  Surgeon: Earline Mayotte, MD;  Location:  [facility] ORS;  Service: General;  Laterality: Right;   BREAST EXCISIONAL BIOPSY Right 06/16/2016   lumpectomy complext sclerosing lesion, benign    Family History  Problem Relation Age of Onset   Diabetes Mother    Diabetes Sister    Heart disease Sister    Diabetes Sister    Diabetes Sister    Thyroid disease Sister    Diabetes Sister    Thyroid disease Sister    Thyroid disease Sister    Heart disease Father  Cancer Neg Hx    Breast cancer Neg Hx     Social History   Socioeconomic History   Marital status: Married    Spouse name: Earl Lites   Number of children: 2   Years of education: Not on file   Highest education level: High school graduate  Occupational History   Not on file  Tobacco Use   Smoking status: Never   Smokeless tobacco: Never  Vaping Use   Vaping status: Never Used  Substance and Sexual Activity   Alcohol use: No    Alcohol/week: 0.0 standard drinks of alcohol   Drug use: No   Sexual activity: Yes    Partners: Male    Birth control/protection: Surgical  Other Topics Concern   Not on file  Social History Narrative   Patient has a combined 3 children but she only birthed 2.   Social Determinants of Health   Financial Resource Strain: Low Risk  (03/31/2023)   Overall Financial Resource Strain (CARDIA)    Difficulty of Paying Living Expenses: Not hard at all  Food Insecurity: No Food Insecurity (03/31/2023)   Hunger Vital Sign    Worried About Running Out of Food in the Last Year: Never true    Ran Out of Food in the Last Year: Never true  Transportation Needs: No Transportation Needs (03/31/2023)   PRAPARE - Administrator, Civil Service (Medical): No    Lack of Transportation (Non-Medical): No  Physical Activity: Sufficiently Active (03/31/2023)   Exercise Vital Sign    Days of Exercise per Week: 5 days    Minutes of Exercise per Session: 30 min  Stress: No Stress Concern Present (03/31/2023)   [employer] of Occupational  Health - Occupational Stress Questionnaire    Feeling of Stress : Only a little  Social Connections: Moderately Integrated (03/31/2023)   Social Connection and Isolation Panel [NHANES]    Frequency of Communication with Friends and Family: More than three times a week    Frequency of Social Gatherings with Friends and Family: More than three times a week    Attends Religious Services: More than 4 times per year    Active Member of [employer] or Organizations: No    Attends Banker Meetings: Never    Marital Status: Married  Catering manager Violence: Not At Risk (03/31/2023)   Humiliation, Afraid, Rape, and Kick questionnaire    Fear of Current or Ex-Partner: No    Emotionally Abused: No    Physically Abused: No    Sexually Abused: No     Current Outpatient Medications:    atorvastatin (LIPITOR) 20 MG tablet, Take 1 tablet (20 mg total) by mouth daily., Disp: 30 tablet, Rfl: 3   blood glucose meter kit and supplies, Dispense based on patient and insurance preference. Use up to four times daily as directed. (FOR ICD-10 E10.9, E11.9)., Disp: 1 each, Rfl: 0   glucose blood (ONETOUCH VERIO) test strip, Use as instructed, Disp: 100 each, Rfl: 12   levothyroxine (SYNTHROID) 150 MCG tablet, TAKE 1 TABLET(150 MCG) BY MOUTH DAILY BEFORE BREAKFAST, Disp: 30 tablet, Rfl: 3   loratadine (CLARITIN) 10 MG tablet, Take 1 tablet (10 mg total) by mouth daily., Disp: 30 tablet, Rfl: 11   metFORMIN (GLUCOPHAGE-XR) 750 MG 24 hr tablet, Take 2 tablets (1,500 mg total) by mouth daily with breakfast., Disp: 60 tablet, Rfl: 3   Multiple Vitamin (MULTIVITAMIN) capsule, Take 1 capsule by mouth daily., Disp: , Rfl:  pioglitazone (ACTOS) 15 MG tablet, Take 1 tablet (15 mg total) by mouth daily., Disp: 30 tablet, Rfl: 3   telmisartan-hydrochlorothiazide (MICARDIS HCT) 80-25 MG tablet, Take 1 tablet by mouth daily., Disp: 30 tablet, Rfl: 3  No Known Allergies   ROS  Constitutional: Negative for fever or  weight change.  Respiratory: Negative for cough and shortness of breath.   Cardiovascular: Negative for chest pain or palpitations.  Gastrointestinal: Negative for abdominal pain, no bowel changes.  Musculoskeletal: Negative for gait problem or joint swelling.  Skin: Negative for rash.  Neurological: Negative for dizziness or headache.  No other specific complaints in a complete review of systems (except as listed in HPI above).   Objective  Vitals:   03/31/23 0809  BP: 132/82  Pulse: 86  Resp: 16  Temp: 98.1 F (36.7 C)  TempSrc: Oral  SpO2: 97%  Weight: 165 lb 1.6 oz (74.9 kg)  Height: 5\' 4"  (1.626 m)    Body mass index is 28.34 kg/m.  Physical Exam  Constitutional: Patient appears well-developed and well-nourished. No distress.  HENT: Head: Normocephalic and atraumatic. Ears: B TMs ok, no erythema or effusion; Nose: Nose normal. Mouth/Throat: Oropharynx is clear and moist. No oropharyngeal exudate.  Eyes: Conjunctivae and EOM are normal. Pupils are equal, round, and reactive to light. No scleral icterus.  Neck: Normal range of motion. Neck supple. No JVD present. No thyromegaly present.  Cardiovascular: Normal rate, regular rhythm and normal heart sounds.  No murmur heard. No BLE edema. Pulmonary/Chest: Effort normal and breath sounds normal. No respiratory distress. Abdominal: Soft. Bowel sounds are normal, no distension. There is no tenderness. no masses Breast: no lumps or masses, no nipple discharge or rashes FEMALE GENITALIA:  Not done  RECTAL: not done  Musculoskeletal: Normal range of motion, no joint effusions. No gross deformities Neurological: he is alert and oriented to person, place, and time. No cranial nerve deficit. Coordination, balance, strength, speech and gait are normal.  Skin: Skin is warm and dry. No rash noted. No erythema.  Psychiatric: Patient has a normal mood and affect. behavior is normal. Judgment and thought content normal.   Fall  Risk:    03/31/2023    7:59 AM 02/07/2023    9:37 AM 11/29/2022    8:38 AM 08/26/2022    3:19 PM 07/30/2022    9:22 AM  Fall Risk   Falls in the past year? 0 0 0 0 0  Number falls in past yr: 0 0 0  0  Injury with Fall? 0 0 0  0  Risk for fall due to : No Fall Risks No Fall Risks No Fall Risks No Fall Risks No Fall Risks  Follow up Falls prevention discussed;Education provided;Falls evaluation completed Falls prevention discussed Falls prevention discussed;Education provided;Falls evaluation completed Falls prevention discussed Falls prevention discussed     Functional Status Survey: Is the patient deaf or have difficulty hearing?: No Does the patient have difficulty seeing, even when wearing glasses/contacts?: No Does the patient have difficulty concentrating, remembering, or making decisions?: No Does the patient have difficulty walking or climbing stairs?: No Does the patient have difficulty dressing or bathing?: No Does the patient have difficulty doing errands alone such as visiting a doctor's office or shopping?: No   Assessment & Plan  1. Well adult exam  - Lipid panel - Microalbumin / creatinine urine ratio - CBC with Differential/Platelet - COMPLETE METABOLIC PANEL WITH GFR - Hemoglobin A1c - TSH  2. Screening for colon cancer  -  Ambulatory referral to Gastroenterology  3. Encounter for screening mammogram for malignant neoplasm of breast  - MM 3D SCREENING MAMMOGRAM BILATERAL BREAST; Future  4. Acquired hypothyroidism  - TSH  5. Dyslipidemia associated with type 2 diabetes mellitus (HCC)  - Lipid panel - Microalbumin / creatinine urine ratio - Hemoglobin A1c  6. Hypertension, benign  - Microalbumin / creatinine urine ratio - CBC with Differential/Platelet - COMPLETE METABOLIC PANEL WITH GFR  7. Long-term use of high-risk medication  - CBC with Differential/Platelet - COMPLETE METABOLIC PANEL WITH GFR   -USPSTF grade A and B recommendations  reviewed with patient; age-appropriate recommendations, preventive care, screening tests, etc discussed and encouraged; healthy living encouraged; see AVS for patient education given to patient -Discussed importance of 150 minutes of physical activity weekly, eat two servings of fish weekly, eat one serving of tree nuts ( cashews, pistachios, pecans, almonds.Marland Kitchen) every other day, eat 6 servings of fruit/vegetables daily and drink plenty of water and avoid sweet beverages.   -Reviewed Health Maintenance: Yes.

## 2023-03-31 ENCOUNTER — Ambulatory Visit (INDEPENDENT_AMBULATORY_CARE_PROVIDER_SITE_OTHER): Payer: Federal, State, Local not specified - PPO | Admitting: Family Medicine

## 2023-03-31 ENCOUNTER — Encounter: Payer: Self-pay | Admitting: Family Medicine

## 2023-03-31 VITALS — BP 132/82 | HR 86 | Temp 98.1°F | Resp 16 | Ht 64.0 in | Wt 165.1 lb

## 2023-03-31 DIAGNOSIS — Z0000 Encounter for general adult medical examination without abnormal findings: Secondary | ICD-10-CM

## 2023-03-31 DIAGNOSIS — E039 Hypothyroidism, unspecified: Secondary | ICD-10-CM

## 2023-03-31 DIAGNOSIS — Z79899 Other long term (current) drug therapy: Secondary | ICD-10-CM

## 2023-03-31 DIAGNOSIS — I10 Essential (primary) hypertension: Secondary | ICD-10-CM

## 2023-03-31 DIAGNOSIS — E1169 Type 2 diabetes mellitus with other specified complication: Secondary | ICD-10-CM

## 2023-03-31 DIAGNOSIS — Z0001 Encounter for general adult medical examination with abnormal findings: Secondary | ICD-10-CM | POA: Diagnosis not present

## 2023-03-31 DIAGNOSIS — E785 Hyperlipidemia, unspecified: Secondary | ICD-10-CM

## 2023-03-31 DIAGNOSIS — Z1231 Encounter for screening mammogram for malignant neoplasm of breast: Secondary | ICD-10-CM

## 2023-03-31 DIAGNOSIS — Z1211 Encounter for screening for malignant neoplasm of colon: Secondary | ICD-10-CM

## 2023-03-31 DIAGNOSIS — Z7984 Long term (current) use of oral hypoglycemic drugs: Secondary | ICD-10-CM

## 2023-03-31 DIAGNOSIS — Z23 Encounter for immunization: Secondary | ICD-10-CM

## 2023-04-01 LAB — MICROALBUMIN / CREATININE URINE RATIO
Creatinine, Urine: 178 mg/dL (ref 20–275)
Microalb Creat Ratio: 2 mg/g{creat} (ref <30)
Microalb, Ur: 0.4 mg/dL

## 2023-04-01 LAB — CBC WITH DIFFERENTIAL/PLATELET
Absolute Lymphocytes: 1546 {cells}/uL (ref 850–3900)
Absolute Monocytes: 304 {cells}/uL (ref 200–950)
Basophils Absolute: 32 {cells}/uL (ref 0–200)
Basophils Relative: 0.7 %
Eosinophils Absolute: 120 {cells}/uL (ref 15–500)
Eosinophils Relative: 2.6 %
HCT: 43.9 % (ref 35.0–45.0)
Hemoglobin: 13.7 g/dL (ref 11.7–15.5)
MCH: 25.3 pg — ABNORMAL LOW (ref 27.0–33.0)
MCHC: 31.2 g/dL — ABNORMAL LOW (ref 32.0–36.0)
MCV: 81.1 fL (ref 80.0–100.0)
MPV: 13.3 fL — ABNORMAL HIGH (ref 7.5–12.5)
Monocytes Relative: 6.6 %
Neutro Abs: 2599 {cells}/uL (ref 1500–7800)
Neutrophils Relative %: 56.5 %
Platelets: 250 10*3/uL (ref 140–400)
RBC: 5.41 10*6/uL — ABNORMAL HIGH (ref 3.80–5.10)
RDW: 14.4 % (ref 11.0–15.0)
Total Lymphocyte: 33.6 %
WBC: 4.6 10*3/uL (ref 3.8–10.8)

## 2023-04-01 LAB — HEMOGLOBIN A1C
Hgb A1c MFr Bld: 7.9 %{Hb} — ABNORMAL HIGH (ref <5.7)
Mean Plasma Glucose: 180 mg/dL
eAG (mmol/L): 10 mmol/L

## 2023-04-01 LAB — COMPLETE METABOLIC PANEL WITH GFR
AG Ratio: 1.4 (calc) (ref 1.0–2.5)
ALT: 21 U/L (ref 6–29)
AST: 16 U/L (ref 10–35)
Albumin: 4.2 g/dL (ref 3.6–5.1)
Alkaline phosphatase (APISO): 98 U/L (ref 37–153)
BUN: 13 mg/dL (ref 7–25)
CO2: 35 mmol/L — ABNORMAL HIGH (ref 20–32)
Calcium: 9.7 mg/dL (ref 8.6–10.4)
Chloride: 97 mmol/L — ABNORMAL LOW (ref 98–110)
Creat: 0.66 mg/dL (ref 0.50–1.03)
Globulin: 2.9 g/dL (ref 1.9–3.7)
Glucose, Bld: 221 mg/dL — ABNORMAL HIGH (ref 65–99)
Potassium: 4.1 mmol/L (ref 3.5–5.3)
Sodium: 140 mmol/L (ref 135–146)
Total Bilirubin: 0.4 mg/dL (ref 0.2–1.2)
Total Protein: 7.1 g/dL (ref 6.1–8.1)
eGFR: 102 mL/min/{1.73_m2} (ref >=60)

## 2023-04-01 LAB — LIPID PANEL
Cholesterol: 157 mg/dL (ref <200)
HDL: 79 mg/dL (ref >=50)
LDL Cholesterol (Calc): 58 mg/dL
Non-HDL Cholesterol (Calc): 78 mg/dL (ref <130)
Total CHOL/HDL Ratio: 2 (calc) (ref <5.0)
Triglycerides: 121 mg/dL (ref <150)

## 2023-04-01 LAB — TSH: TSH: 0.52 m[IU]/L (ref 0.40–4.50)

## 2023-04-03 ENCOUNTER — Other Ambulatory Visit: Payer: Self-pay

## 2023-04-03 ENCOUNTER — Telehealth: Payer: Self-pay

## 2023-04-03 ENCOUNTER — Ambulatory Visit: Payer: Federal, State, Local not specified - PPO | Admitting: Podiatry

## 2023-04-03 ENCOUNTER — Encounter: Payer: Self-pay | Admitting: Podiatry

## 2023-04-03 VITALS — Ht 64.0 in | Wt 165.0 lb

## 2023-04-03 DIAGNOSIS — Q666 Other congenital valgus deformities of feet: Secondary | ICD-10-CM | POA: Diagnosis not present

## 2023-04-03 DIAGNOSIS — M7751 Other enthesopathy of right foot: Secondary | ICD-10-CM

## 2023-04-03 DIAGNOSIS — Z1211 Encounter for screening for malignant neoplasm of colon: Secondary | ICD-10-CM

## 2023-04-03 MED ORDER — NA SULFATE-K SULFATE-MG SULF 17.5-3.13-1.6 GM/177ML PO SOLN: 1.0000 | Freq: Once | ORAL | 0 refills | Status: AC

## 2023-04-03 NOTE — Telephone Encounter (Signed)
Gastroenterology Pre-Procedure Review  Request Date: 04/17/23 Requesting Physician: Dr. Servando Snare  PATIENT REVIEW QUESTIONS: The patient responded to the following health history questions as indicated:    1. Are you having any GI issues? no 2. Do you have a personal history of Polyps? no 3. Do you have a family history of Colon Cancer or Polyps? no 4. Diabetes Mellitus? yes (takes metformin and pioglitazone has been advised to stop 2 days prior to colonoscopy) 5. Joint replacements in the past 12 months?no 6. Major health problems in the past 3 months?no 7. Any artificial heart valves, MVP, or defibrillator?no    MEDICATIONS & ALLERGIES:    Patient reports the following regarding taking any anticoagulation/antiplatelet therapy:   Plavix, Coumadin, Eliquis, Xarelto, Lovenox, Pradaxa, Brilinta, or Effient? no Aspirin? no  Patient confirms/reports the following medications:  Current Outpatient Medications  Medication Sig Dispense Refill   atorvastatin (LIPITOR) 20 MG tablet Take 1 tablet (20 mg total) by mouth daily. 30 tablet 3   blood glucose meter kit and supplies Dispense based on patient and insurance preference. Use up to four times daily as directed. (FOR ICD-10 E10.9, E11.9). 1 each 0   glucose blood (ONETOUCH VERIO) test strip Use as instructed 100 each 12   levothyroxine (SYNTHROID) 150 MCG tablet TAKE 1 TABLET(150 MCG) BY MOUTH DAILY BEFORE BREAKFAST 30 tablet 3   loratadine (CLARITIN) 10 MG tablet Take 1 tablet (10 mg total) by mouth daily. 30 tablet 11   metFORMIN (GLUCOPHAGE-XR) 750 MG 24 hr tablet Take 2 tablets (1,500 mg total) by mouth daily with breakfast. 60 tablet 3   Multiple Vitamin (MULTIVITAMIN) capsule Take 1 capsule by mouth daily.     pioglitazone (ACTOS) 15 MG tablet Take 1 tablet (15 mg total) by mouth daily. 30 tablet 3   telmisartan-hydrochlorothiazide (MICARDIS HCT) 80-25 MG tablet Take 1 tablet by mouth daily. 30 tablet 3   No current facility-administered  medications for this visit.    Patient confirms/reports the following allergies:  No Known Allergies  No orders of the defined types were placed in this encounter.   AUTHORIZATION INFORMATION Primary Insurance: 1D#: Group #:  Secondary Insurance: 1D#: Group #:  SCHEDULE INFORMATION: Date: 04/17/23 Time: Location: [facility]

## 2023-04-03 NOTE — Progress Notes (Signed)
Subjective:  Patient ID: Valerie Manning, female    DOB: 1965-06-20,  MRN: [id number]  Chief Complaint  Patient presents with   Foot Pain    Pt is here to f/u about right foot pain.    57 y.o. female presents with the above complaint.  Patient is for follow-up of her right second metatarsophalangeal joint with underlying pes planovalgus deformity.  Patient states that she is doing much better 0 pain she is able to walk back to normal.  Her pain is 100% resolved   Review of Systems: Negative except as noted in the HPI. Denies N/V/F/Ch.  Past Medical History:  Diagnosis Date   Acquired hypothyroidism 03/13/2015   Cramp in muscle    H/O OF MUSCLE CRAMP IN CHEST AND STOMACH OCCAS WHEN BENDS AT WAIST. WHEN STANDS AND BENDS BACK HEARS POP. CRAMP GOES AWAY. HAS NOT HAPPENES IN MANY MONTHS   Diabetes mellitus without complication (HCC)    Dyslipidemia associated with type 2 diabetes mellitus (HCC) 05/31/2014   Hyperlipidemia    Hypertension    Left ear impacted cerumen 03/31/2017    Current Outpatient Medications:    atorvastatin (LIPITOR) 20 MG tablet, Take 1 tablet (20 mg total) by mouth daily., Disp: 30 tablet, Rfl: 3   blood glucose meter kit and supplies, Dispense based on patient and insurance preference. Use up to four times daily as directed. (FOR ICD-10 E10.9, E11.9)., Disp: 1 each, Rfl: 0   glucose blood (ONETOUCH VERIO) test strip, Use as instructed, Disp: 100 each, Rfl: 12   levothyroxine (SYNTHROID) 150 MCG tablet, TAKE 1 TABLET(150 MCG) BY MOUTH DAILY BEFORE BREAKFAST, Disp: 30 tablet, Rfl: 3   loratadine (CLARITIN) 10 MG tablet, Take 1 tablet (10 mg total) by mouth daily., Disp: 30 tablet, Rfl: 11   metFORMIN (GLUCOPHAGE-XR) 750 MG 24 hr tablet, Take 2 tablets (1,500 mg total) by mouth daily with breakfast., Disp: 60 tablet, Rfl: 3   Multiple Vitamin (MULTIVITAMIN) capsule, Take 1 capsule by mouth daily., Disp: , Rfl:    pioglitazone (ACTOS) 15 MG tablet, Take 1 tablet (15 mg  total) by mouth daily., Disp: 30 tablet, Rfl: 3   telmisartan-hydrochlorothiazide (MICARDIS HCT) 80-25 MG tablet, Take 1 tablet by mouth daily., Disp: 30 tablet, Rfl: 3  Social History   Tobacco Use  Smoking Status Never  Smokeless Tobacco Never    No Known Allergies Objective:  There were no vitals filed for this visit. Body mass index is 28.32 kg/m. Constitutional Well developed. Well nourished.  Vascular Dorsalis pedis pulses palpable bilaterally. Posterior tibial pulses palpable bilaterally. Capillary refill normal to all digits.  No cyanosis or clubbing noted. Pedal hair growth normal.  Neurologic Normal speech. Oriented to person, place, and time. Epicritic sensation to light touch grossly present bilaterally.  Dermatologic Nails well groomed and normal in appearance. No open wounds. No skin lesions.  Orthopedic: No further pain on palpation of right second metatarsophalangeal joint no further pain with range of motion of the joint no deep intra-articular pain noted.  No flattening of the metatarsal head noted.  No crepitus noted.   Radiographs: None Assessment:   1. Capsulitis of metatarsophalangeal (MTP) joint of right foot   2. Pes planovalgus     Plan:  Patient was evaluated and treated and all questions answered.  Right second MTP capsulitis -All questions and concerns were discussed with the patient extensive due to nuclear pain is completely resolved.  At this time I discussed shoe gear modification and orthotics she  states understanding.  Pes planovalgus -I explained to patient the etiology of pes planovalgus and relationship with Planter fasciitis and various treatment options were discussed.  Given patient foot structure in the setting of Planter fasciitis I believe patient will benefit from custom-made orthotics to help control the hindfoot motion support the arch of the foot and take the stress away from plantar fascial.  Patient agrees with the plan like  to proceed with orthotics -Patient was casted for orthotics with offloading of bilateral second metatarsophalangeal joint    No follow-ups on file.

## 2023-04-03 NOTE — Telephone Encounter (Signed)
Patient called in to schedule colonoscopy.

## 2023-04-07 NOTE — Progress Notes (Signed)
Name: Valerie Manning   MRN: [id number]    DOB: 03/21/1966   Date:04/18/2023       Progress Note  Subjective  Chief Complaint  Chief Complaint  Patient presents with   Medical Management of Chronic Issues    HPI  Discussed the use of AI scribe software for clinical note transcription with the patient, who gave verbal consent to proceed.  History of Present Illness   The patient, with a history of diabetes type 2, hypertension, and hypothyroidism, presents for a routine follow-up. She reports no new health issues since the last visit. She recently underwent a colonoscopy, which was normal with no polyps or bleeding internal hemorrhoids identified. The patient also had an eye exam, the results of which are pending.  The patient's thyroid levels, previously low, have normalized and she continues to take levothyroxine 150mg  daily without any reported issues. She denies any problems with swallowing, significant hair loss, changes in bowel movements, or excessive dry skin.  However, the patient's recent A1c was 7.9, a slight increase from her usual mid-six range. She attributes this increase to a change in diet and discontinuation of glipizide over 6 months ago. She has noticed some increase in hunger and thirsty over the past few months. She has been trying to follow a diabetic diet.   The patient's blood pressure, managed with telmisartan HCTZ, is slightly elevated at 132/84.  The patient's cholesterol is well-managed with atorvastatin, with a recent LDL of 58. She reports no issues with reflux or heartburn. The patient is due for an EKG at her next visit, as her last one was in 2018.         Patient Active Problem List   Diagnosis Date Noted   Encounter for screening colonoscopy 04/17/2023   Dyslipidemia associated with type 2 diabetes mellitus (HCC) 11/29/2022   Carpal tunnel syndrome, right 10/29/2021   Hypertension associated with type 2 diabetes mellitus (HCC) 10/29/2021    Hypertension, benign 10/29/2021   Adhesive capsulitis of left shoulder 05/12/2019   Lipoma of neck 02/27/2016   Status post abdominal hysterectomy and left salpingo-oophorectomy 10/25/2015   Acquired hypothyroidism 03/13/2015   History of iron deficiency anemia 03/13/2015   Hypertension 08/15/2014   Type 2 diabetes mellitus with hyperglycemia, without long-term current use of insulin (HCC) 05/31/2014   GERD (gastroesophageal reflux disease) 05/31/2014   Hyperlipidemia 05/31/2014    Past Surgical History:  Procedure Laterality Date   ABDOMINAL HYSTERECTOMY  08/10/2013   pathology report contains cervix   BREAST BIOPSY Right 05/2015   Pathology revealed stromal fibrosis with intra atrophic benign x2 areas   BREAST BIOPSY Right 09/13/2016   Procedure: BREAST BIOPSY WITH NEEDLE LOCALIZATION;  Surgeon: Earline Mayotte, MD;  Location: [facility] ORS;  Service: General;  Laterality: Right;   BREAST EXCISIONAL BIOPSY Right 06/16/2016   lumpectomy complext sclerosing lesion, benign    Family History  Problem Relation Age of Onset   Diabetes Mother    Diabetes Sister    Heart disease Sister    Diabetes Sister    Diabetes Sister    Thyroid disease Sister    Diabetes Sister    Thyroid disease Sister    Thyroid disease Sister    Heart disease Father    Cancer Neg Hx    Breast cancer Neg Hx     Social History   Tobacco Use   Smoking status: Never   Smokeless tobacco: Never  Substance Use Topics   Alcohol use: No  Alcohol/week: 0.0 standard drinks of alcohol     Current Outpatient Medications:    blood glucose meter kit and supplies, Dispense based on patient and insurance preference. Use up to four times daily as directed. (FOR ICD-10 E10.9, E11.9)., Disp: 1 each, Rfl: 0   glipiZIDE (GLUCOTROL XL) 2.5 MG 24 hr tablet, Take 1 tablet (2.5 mg total) by mouth daily with breakfast., Disp: 30 tablet, Rfl: 3   glucose blood (ONETOUCH VERIO) test strip, Use as instructed, Disp: 100  each, Rfl: 12   loratadine (CLARITIN) 10 MG tablet, Take 1 tablet (10 mg total) by mouth daily., Disp: 30 tablet, Rfl: 11   Multiple Vitamin (MULTIVITAMIN) capsule, Take 1 capsule by mouth daily., Disp: , Rfl:    atorvastatin (LIPITOR) 20 MG tablet, Take 1 tablet (20 mg total) by mouth daily., Disp: 30 tablet, Rfl: 3   levothyroxine (SYNTHROID) 150 MCG tablet, TAKE 1 TABLET(150 MCG) BY MOUTH DAILY BEFORE BREAKFAST, Disp: 30 tablet, Rfl: 3   metFORMIN (GLUCOPHAGE-XR) 750 MG 24 hr tablet, Take 2 tablets (1,500 mg total) by mouth daily with breakfast., Disp: 60 tablet, Rfl: 3   pioglitazone (ACTOS) 15 MG tablet, Take 1 tablet (15 mg total) by mouth daily., Disp: 30 tablet, Rfl: 3   telmisartan-hydrochlorothiazide (MICARDIS HCT) 80-25 MG tablet, Take 1 tablet by mouth daily., Disp: 30 tablet, Rfl: 3  No Known Allergies  I personally reviewed active problem list, medication list, allergies, family history with the patient/caregiver today.   ROS  Ten systems reviewed and is negative except as mentioned in HPI    Objective  Vitals:   04/18/23 0802  BP: 132/84  Pulse: 90  Resp: 16  Temp: 97.9 F (36.6 C)  TempSrc: Oral  SpO2: 99%  Weight: 163 lb 12.8 oz (74.3 kg)  Height: 5\' 4"  (1.626 m)    Body mass index is 28.12 kg/m.  Physical Exam  Constitutional: Patient appears well-developed and well-nourished.  No distress.  HEENT: head atraumatic, normocephalic, pupils equal and reactive to light, neck supple Cardiovascular: Normal rate, regular rhythm and normal heart sounds.  No murmur heard. No BLE edema. Pulmonary/Chest: Effort normal and breath sounds normal. No respiratory distress. Abdominal: Soft.  There is no tenderness. Psychiatric: Patient has a normal mood and affect. behavior is normal. Judgment and thought content normal.   Recent Results (from the past 2160 hours)  Lipid panel     Status: None   Collection Time: 03/31/23  8:49 AM  Result Value Ref Range   Cholesterol  157 <200 mg/dL   HDL 79 > OR = 50 mg/dL   Triglycerides 725 <366 mg/dL   LDL Cholesterol (Calc) 58 mg/dL (calc)    Comment: Reference range: <100 . Desirable range <100 mg/dL for primary prevention;   <70 mg/dL for patients with CHD or diabetic patients  with > or = 2 CHD risk factors. Marland Kitchen LDL-C is now calculated using the Martin-Hopkins  calculation, which is a validated novel method providing  better accuracy than the Friedewald equation in the  estimation of LDL-C.  Horald Pollen et al. Lenox Ahr. 4403;474(25): 2061-2068  (http://education.[facility].com/faq/FAQ164)    Total CHOL/HDL Ratio 2.0 <5.0 (calc)   Non-HDL Cholesterol (Calc) 78 <956 mg/dL (calc)    Comment: For patients with diabetes plus 1 major ASCVD risk  factor, treating to a non-HDL-C goal of <100 mg/dL  (LDL-C of <38 mg/dL) is considered a therapeutic  option.   Microalbumin / creatinine urine ratio     Status: None  Collection Time: 03/31/23  8:49 AM  Result Value Ref Range   Creatinine, Urine 178 20 - 275 mg/dL   Microalb, Ur 0.4 mg/dL    Comment: Reference Range Not established    Microalb Creat Ratio 2 <30 mg/g creat    Comment: . The ADA defines abnormalities in albumin excretion as follows: Marland Kitchen Albuminuria Category        Result (mg/g creatinine) . Normal to Mildly increased   <30 Moderately increased         30-299  Severely increased           > OR = 300 . The ADA recommends that at least two of three specimens collected within a 3-6 month period be abnormal before considering a patient to be within a diagnostic category.   CBC with Differential/Platelet     Status: Abnormal   Collection Time: 03/31/23  8:49 AM  Result Value Ref Range   WBC 4.6 3.8 - 10.8 Thousand/uL   RBC 5.41 (H) 3.80 - 5.10 Million/uL   Hemoglobin 13.7 11.7 - 15.5 g/dL   HCT 09.6 04.5 - 40.9 %   MCV 81.1 80.0 - 100.0 fL   MCH 25.3 (L) 27.0 - 33.0 pg   MCHC 31.2 (L) 32.0 - 36.0 g/dL    Comment: For adults, a slight  decrease in the calculated MCHC value (in the range of 30 to 32 g/dL) is most likely not clinically significant; however, it should be interpreted with caution in correlation with other red cell parameters and the patient's clinical condition.    RDW 14.4 11.0 - 15.0 %   Platelets 250 140 - 400 Thousand/uL   MPV 13.3 (H) 7.5 - 12.5 fL   Neutro Abs 2,599 1,500 - 7,800 cells/uL   Absolute Lymphocytes 1,546 850 - 3,900 cells/uL   Absolute Monocytes 304 200 - 950 cells/uL   Eosinophils Absolute 120 15 - 500 cells/uL   Basophils Absolute 32 0 - 200 cells/uL   Neutrophils Relative % 56.5 %   Total Lymphocyte 33.6 %   Monocytes Relative 6.6 %   Eosinophils Relative 2.6 %   Basophils Relative 0.7 %  COMPLETE METABOLIC PANEL WITH GFR     Status: Abnormal   Collection Time: 03/31/23  8:49 AM  Result Value Ref Range   Glucose, Bld 221 (H) 65 - 99 mg/dL    Comment: .            Fasting reference interval . For someone without known diabetes, a glucose value >125 mg/dL indicates that they may have diabetes and this should be confirmed with a follow-up test. .    BUN 13 7 - 25 mg/dL   Creat 8.11 9.14 - 7.82 mg/dL   eGFR 956 > OR = 60 OZ/HYQ/6.57Q4   BUN/Creatinine Ratio SEE NOTE: 6 - 22 (calc)    Comment:    Not Reported: BUN and Creatinine are within    reference range. .    Sodium 140 135 - 146 mmol/L   Potassium 4.1 3.5 - 5.3 mmol/L   Chloride 97 (L) 98 - 110 mmol/L   CO2 35 (H) 20 - 32 mmol/L   Calcium 9.7 8.6 - 10.4 mg/dL   Total Protein 7.1 6.1 - 8.1 g/dL   Albumin 4.2 3.6 - 5.1 g/dL   Globulin 2.9 1.9 - 3.7 g/dL (calc)   AG Ratio 1.4 1.0 - 2.5 (calc)   Total Bilirubin 0.4 0.2 - 1.2 mg/dL   Alkaline phosphatase (APISO) 98 37 -  153 U/L   AST 16 10 - 35 U/L   ALT 21 6 - 29 U/L  Hemoglobin A1c     Status: Abnormal   Collection Time: 03/31/23  8:49 AM  Result Value Ref Range   Hgb A1c MFr Bld 7.9 (H) <5.7 % of total Hgb    Comment: For someone without known diabetes, a  hemoglobin A1c value of 6.5% or greater indicates that they may have  diabetes and this should be confirmed with a follow-up  test. . For someone with known diabetes, a value <7% indicates  that their diabetes is well controlled and a value  greater than or equal to 7% indicates suboptimal  control. A1c targets should be individualized based on  duration of diabetes, age, comorbid conditions, and  other considerations. . Currently, no consensus exists regarding use of hemoglobin A1c for diagnosis of diabetes for children. .    Mean Plasma Glucose 180 mg/dL   eAG (mmol/L) 16.1 mmol/L  TSH     Status: None   Collection Time: 03/31/23  8:49 AM  Result Value Ref Range   TSH 0.52 0.40 - 4.50 mIU/L  Glucose, capillary     Status: Abnormal   Collection Time: 04/17/23 10:07 AM  Result Value Ref Range   Glucose-Capillary 212 (H) 70 - 99 mg/dL    Comment: Glucose reference range applies only to samples taken after fasting for at least 8 hours.    Diabetic Foot Exam: Diabetic Foot Exam - Simple   Simple Foot Form Visual Inspection No deformities, no ulcerations, no other skin breakdown bilaterally: Yes Sensation Testing Intact to touch and monofilament testing bilaterally: Yes Pulse Check Posterior Tibialis and Dorsalis pulse intact bilaterally: Yes Comments      PHQ2/9:    03/31/2023    8:05 AM 02/07/2023    9:38 AM 11/29/2022    8:38 AM 07/30/2022    9:22 AM 03/29/2022   10:31 AM  Depression screen PHQ 2/9  Decreased Interest 0 0 0 0 0  Down, Depressed, Hopeless 0 0 0 0 0  PHQ - 2 Score 0 0 0 0 0  Altered sleeping 0  0 0 0  Tired, decreased energy 0  0 0 0  Change in appetite 0  0 0 0  Feeling bad or failure about yourself  0  0 0 0  Trouble concentrating 0  0 0 0  Moving slowly or fidgety/restless 0  0 0 0  Suicidal thoughts 0  0 0 0  PHQ-9 Score 0  0 0 0  Difficult doing work/chores Not difficult at all  Not difficult at all      phq 9 is negative   Fall  Risk:    03/31/2023    7:59 AM 02/07/2023    9:37 AM 11/29/2022    8:38 AM 08/26/2022    3:19 PM 07/30/2022    9:22 AM  Fall Risk   Falls in the past year? 0 0 0 0 0  Number falls in past yr: 0 0 0  0  Injury with Fall? 0 0 0  0  Risk for fall due to : No Fall Risks No Fall Risks No Fall Risks No Fall Risks No Fall Risks  Follow up Falls prevention discussed;Education provided;Falls evaluation completed Falls prevention discussed Falls prevention discussed;Education provided;Falls evaluation completed Falls prevention discussed Falls prevention discussed    Assessment & Plan  Assessment and Plan    Type 2 Diabetes Mellitus with associated HTN and  dyslipidemia A1c increased to 7.9 from 7.0 in August. Patient stopped Glipizide and is currently on Metformin and Pioglitazone. Discussed the importance of diet control and consistent medication use. -Resume Glipizide at a low dose of 2.5mg  daily. -Encouraged patient to monitor diet and increase physical activity.  Hypothyroidism Thyroid levels have been in the low range of normal. Patient is currently on Levothyroxine 150mg  daily with no reported side effects. -Continue Levothyroxine 150mg  daily.  Hypertension Blood pressure slightly elevated at 132/84. Patient is currently on Thalmasartan HCTZ 80/25mg  daily. -Encouraged patient to monitor blood pressure at home. -Continue Thalmasartan HCTZ 80/25mg  daily.  Hyperlipidemia LDL cholesterol well controlled at 58. Patient is currently on Atorvastatin. -Continue Atorvastatin.  General Health Maintenance -Colonoscopy performed with normal results. Next colonoscopy due in 2034. -Eye exam completed. Awaiting results. -Foot exam completed today with no abnormalities noted. -Next EKG due at next visit in 4 months.

## 2023-04-09 ENCOUNTER — Telehealth: Payer: Self-pay

## 2023-04-09 NOTE — Telephone Encounter (Signed)
Patient called in left a voicemail requesting to talk to [place]. I called the patient back to inform her we receive her message, and I sent the message to [facility].

## 2023-04-10 ENCOUNTER — Encounter: Payer: Self-pay | Admitting: Gastroenterology

## 2023-04-17 ENCOUNTER — Ambulatory Visit
Admission: RE | Admit: 2023-04-17 | Discharge: 2023-04-17 | Disposition: A | Discharge status: Home / Self Care | Payer: Federal, State, Local not specified - PPO | Attending: Gastroenterology | Admitting: Gastroenterology

## 2023-04-17 ENCOUNTER — Ambulatory Visit: Payer: Federal, State, Local not specified - PPO | Admitting: Anesthesiology

## 2023-04-17 ENCOUNTER — Encounter
Admission: RE | Disposition: A | Discharge status: Home / Self Care | Payer: Self-pay | Source: Home / Self Care | Attending: Gastroenterology

## 2023-04-17 DIAGNOSIS — Z79899 Other long term (current) drug therapy: Secondary | ICD-10-CM | POA: Diagnosis not present

## 2023-04-17 DIAGNOSIS — E119 Type 2 diabetes mellitus without complications: Secondary | ICD-10-CM | POA: Insufficient documentation

## 2023-04-17 DIAGNOSIS — Z7984 Long term (current) use of oral hypoglycemic drugs: Secondary | ICD-10-CM | POA: Insufficient documentation

## 2023-04-17 DIAGNOSIS — Z1211 Encounter for screening for malignant neoplasm of colon: Secondary | ICD-10-CM

## 2023-04-17 DIAGNOSIS — K219 Gastro-esophageal reflux disease without esophagitis: Secondary | ICD-10-CM | POA: Insufficient documentation

## 2023-04-17 DIAGNOSIS — E039 Hypothyroidism, unspecified: Secondary | ICD-10-CM | POA: Diagnosis not present

## 2023-04-17 DIAGNOSIS — I10 Essential (primary) hypertension: Secondary | ICD-10-CM | POA: Diagnosis not present

## 2023-04-17 DIAGNOSIS — K640 First degree hemorrhoids: Secondary | ICD-10-CM | POA: Insufficient documentation

## 2023-04-17 HISTORY — PX: COLONOSCOPY WITH PROPOFOL: SHX5780

## 2023-04-17 LAB — GLUCOSE, CAPILLARY: Glucose-Capillary: 212 mg/dL — ABNORMAL HIGH (ref 70–99)

## 2023-04-17 SURGERY — COLONOSCOPY WITH PROPOFOL
Anesthesia: General

## 2023-04-17 MED ORDER — PROPOFOL 10 MG/ML IV BOLUS
INTRAVENOUS | Status: DC | PRN
  Administered 2023-04-17: 80 mg via INTRAVENOUS

## 2023-04-17 MED ORDER — PROPOFOL 500 MG/50ML IV EMUL
INTRAVENOUS | Status: DC | PRN
  Administered 2023-04-17: 150 ug/kg/min via INTRAVENOUS

## 2023-04-17 MED ORDER — DEXMEDETOMIDINE HCL IN NACL 80 MCG/20ML IV SOLN
INTRAVENOUS | Status: DC | PRN
  Administered 2023-04-17: 4 ug via INTRAVENOUS

## 2023-04-17 MED ORDER — SODIUM CHLORIDE 0.9 % IV SOLN: INTRAVENOUS | Status: DC

## 2023-04-17 MED ORDER — LIDOCAINE HCL (CARDIAC) PF 100 MG/5ML IV SOSY
PREFILLED_SYRINGE | INTRAVENOUS | Status: DC | PRN
  Administered 2023-04-17: 40 mg via INTRAVENOUS

## 2023-04-17 NOTE — H&P (Signed)
Midge Minium, MD [facility] [address] [place], [place] [address] Phone: [phone] Fax : [phone]  Primary Care Physician:  Alba Cory, MD Primary Gastroenterologist:  Dr. Servando Snare  Pre-Procedure History & Physical: HPI:  Valerie Manning is a 57 y.o. female is here for a screening colonoscopy.   Past Medical History:  Diagnosis Date   Acquired hypothyroidism 03/13/2015   Cramp in muscle    H/O OF MUSCLE CRAMP IN CHEST AND STOMACH OCCAS WHEN BENDS AT WAIST. WHEN STANDS AND BENDS BACK HEARS POP. CRAMP GOES AWAY. HAS NOT HAPPENES IN MANY MONTHS   Diabetes mellitus without complication (HCC)    Dyslipidemia associated with type 2 diabetes mellitus (HCC) 05/31/2014   Hyperlipidemia    Hypertension    Left ear impacted cerumen 03/31/2017    Past Surgical History:  Procedure Laterality Date   ABDOMINAL HYSTERECTOMY  08/10/2013   pathology report contains cervix   BREAST BIOPSY Right 05/2015   Pathology revealed stromal fibrosis with intra atrophic benign x2 areas   BREAST BIOPSY Right 09/13/2016   Procedure: BREAST BIOPSY WITH NEEDLE LOCALIZATION;  Surgeon: Earline Mayotte, MD;  Location: [facility] ORS;  Service: General;  Laterality: Right;   BREAST EXCISIONAL BIOPSY Right 06/16/2016   lumpectomy complext sclerosing lesion, benign    Prior to Admission medications   Medication Sig Start Date End Date Taking? Authorizing Provider  levothyroxine (SYNTHROID) 150 MCG tablet TAKE 1 TABLET(150 MCG) BY MOUTH DAILY BEFORE BREAKFAST 11/29/22  Yes Sowles, Danna Hefty, MD  pioglitazone (ACTOS) 15 MG tablet Take 1 tablet (15 mg total) by mouth daily. 11/29/22  Yes Sowles, Danna Hefty, MD  telmisartan-hydrochlorothiazide (MICARDIS HCT) 80-25 MG tablet Take 1 tablet by mouth daily. 11/29/22  Yes Sowles, Danna Hefty, MD  atorvastatin (LIPITOR) 20 MG tablet Take 1 tablet (20 mg total) by mouth daily. 11/29/22   Alba Cory, MD  blood glucose meter kit and supplies Dispense based on patient and  insurance preference. Use up to four times daily as directed. (FOR ICD-10 E10.9, E11.9). 03/29/22   Alba Cory, MD  glucose blood (ONETOUCH VERIO) test strip Use as instructed 02/13/18   Alba Cory, MD  loratadine (CLARITIN) 10 MG tablet Take 1 tablet (10 mg total) by mouth daily. 08/26/22   Danelle Berry, PA-C  metFORMIN (GLUCOPHAGE-XR) 750 MG 24 hr tablet Take 2 tablets (1,500 mg total) by mouth daily with breakfast. 11/29/22   Alba Cory, MD  Multiple Vitamin (MULTIVITAMIN) capsule Take 1 capsule by mouth daily.    [provider]    Allergies as of 04/03/2023   (No Known Allergies)    Family History  Problem Relation Age of Onset   Diabetes Mother    Diabetes Sister    Heart disease Sister    Diabetes Sister    Diabetes Sister    Thyroid disease Sister    Diabetes Sister    Thyroid disease Sister    Thyroid disease Sister    Heart disease Father    Cancer Neg Hx    Breast cancer Neg Hx     Social History   Socioeconomic History   Marital status: Married    Spouse name: Earl Lites   Number of children: 2   Years of education: Not on file   Highest education level: High school graduate  Occupational History   Not on file  Tobacco Use   Smoking status: Never   Smokeless tobacco: Never  Vaping Use   Vaping status: Never Used  Substance and Sexual Activity  Alcohol use: No    Alcohol/week: 0.0 standard drinks of alcohol   Drug use: No   Sexual activity: Yes    Partners: Male    Birth control/protection: Surgical  Other Topics Concern   Not on file  Social History Narrative   Patient has a combined 3 children but she only birthed 2.   Social Drivers of Corporate investment banker Strain: Low Risk  (03/31/2023)   Overall Financial Resource Strain (CARDIA)    Difficulty of Paying Living Expenses: Not hard at all  Food Insecurity: No Food Insecurity (03/31/2023)   Hunger Vital Sign    Worried About Running Out of Food in the Last Year: Never true     Ran Out of Food in the Last Year: Never true  Transportation Needs: No Transportation Needs (03/31/2023)   PRAPARE - Administrator, Civil Service (Medical): No    Lack of Transportation (Non-Medical): No  Physical Activity: Sufficiently Active (03/31/2023)   Exercise Vital Sign    Days of Exercise per Week: 5 days    Minutes of Exercise per Session: 30 min  Stress: No Stress Concern Present (03/31/2023)   [employer] of Occupational Health - Occupational Stress Questionnaire    Feeling of Stress : Only a little  Social Connections: Moderately Integrated (03/31/2023)   Social Connection and Isolation Panel [NHANES]    Frequency of Communication with Friends and Family: More than three times a week    Frequency of Social Gatherings with Friends and Family: More than three times a week    Attends Religious Services: More than 4 times per year    Active Member of [employer] or Organizations: No    Attends Banker Meetings: Never    Marital Status: Married  Catering manager Violence: Not At Risk (03/31/2023)   Humiliation, Afraid, Rape, and Kick questionnaire    Fear of Current or Ex-Partner: No    Emotionally Abused: No    Physically Abused: No    Sexually Abused: No    Review of Systems: See HPI, otherwise negative ROS  Physical Exam: BP (!) 167/83   Pulse 65   Temp (!) 96.8 F (36 C) (Temporal)   Resp 18   Ht 5\' 4"  (1.626 m)   Wt 73 kg   SpO2 100%   BMI 27.64 kg/m  General:   Alert,  pleasant and cooperative in NAD Head:  Normocephalic and atraumatic. Neck:  Supple; no masses or thyromegaly. Lungs:  Clear throughout to auscultation.    Heart:  Regular rate and rhythm. Abdomen:  Soft, nontender and nondistended. Normal bowel sounds, without guarding, and without rebound.   Neurologic:  Alert and  oriented x4;  grossly normal neurologically.  Impression/Plan: Valerie Manning is now here to undergo a screening colonoscopy.  Risks, benefits, and  alternatives regarding colonoscopy have been reviewed with the patient.  Questions have been answered.  All parties agreeable.

## 2023-04-17 NOTE — Anesthesia Procedure Notes (Signed)
Date/Time: 04/17/2023 10:37 AM  Performed by: Stormy Fabian, CRNAPre-anesthesia Checklist: Patient identified, Emergency Drugs available, Suction available and Patient being monitored Patient Re-evaluated:Patient Re-evaluated prior to induction Oxygen Delivery Method: Nasal cannula Induction Type: IV induction Dental Injury: Teeth and Oropharynx as per pre-operative assessment  Comments: Nasal cannula with etCO2 monitoring

## 2023-04-17 NOTE — Op Note (Signed)
[facility] Gastroenterology Patient Name: Valerie Manning Procedure Date: 04/17/2023 10:30 AM MRN: [id number] Account #: [id number] Date of Birth: October 04, 1965 Admit Type: Outpatient Age: 57 Room: [facility] ENDO ROOM 4 Gender: Female Note Status: Finalized Instrument Name: Prentice Docker [id number] Procedure:             Colonoscopy Indications:           Screening for colorectal malignant neoplasm Providers:             Midge Minium MD, MD Referring MD:          Onnie Boer. Sowles, MD (Referring MD) Medicines:             Propofol per Anesthesia Complications:         No immediate complications. Procedure:             Pre-Anesthesia Assessment:                        - Prior to the procedure, a History and Physical was                         performed, and patient medications and allergies were                         reviewed. The patient's tolerance of previous                         anesthesia was also reviewed. The risks and benefits                         of the procedure and the sedation options and risks                         were discussed with the patient. All questions were                         answered, and informed consent was obtained. Prior                         Anticoagulants: The patient has taken no anticoagulant                         or antiplatelet agents. ASA Grade Assessment: II - A                         patient with mild systemic disease. After reviewing                         the risks and benefits, the patient was deemed in                         satisfactory condition to undergo the procedure.                        After obtaining informed consent, the colonoscope was                         passed under direct vision. Throughout the procedure,  the patient's blood pressure, pulse, and oxygen                         saturations were monitored continuously. The                         Colonoscope was introduced  through the anus and                         advanced to the the cecum, identified by appendiceal                         orifice and ileocecal valve. The colonoscopy was                         performed without difficulty. The patient tolerated                         the procedure well. The quality of the bowel                         preparation was excellent. Findings:      The perianal and digital rectal examinations were normal.      Non-bleeding internal hemorrhoids were found during retroflexion. The       hemorrhoids were Grade I (internal hemorrhoids that do not prolapse). Impression:            - Non-bleeding internal hemorrhoids.                        - No specimens collected. Recommendation:        - Discharge patient to home.                        - Resume previous diet.                        - Continue present medications. Procedure Code(s):     --- Professional ---                        [phone], Colonoscopy, flexible; diagnostic, including                         collection of specimen(s) by brushing or washing, when                         performed (separate procedure) Diagnosis Code(s):     --- Professional ---                        Z12.11, Encounter for screening for malignant neoplasm                         of colon CPT copyright 2022 American Medical Association. All rights reserved. The codes documented in this report are preliminary and upon coder review may  be revised to meet current compliance requirements. Midge Minium MD, MD 04/17/2023 10:48:33 AM This report has been signed electronically. Number of Addenda: 0 Note Initiated On: 04/17/2023 10:30 AM Scope Withdrawal Time: 0 hours 8 minutes 24 seconds  Total Procedure Duration:  0 hours 11 minutes 3 seconds  Estimated Blood Loss:  Estimated blood loss: none.      [facility]

## 2023-04-17 NOTE — Anesthesia Postprocedure Evaluation (Signed)
Anesthesia Post Note  Patient: Amybeth Suttner Widdowson  Procedure(s) Performed: COLONOSCOPY WITH PROPOFOL  Patient location during evaluation: Endoscopy Anesthesia Type: General Level of consciousness: awake and alert Pain management: pain level controlled Vital Signs Assessment: post-procedure vital signs reviewed and stable Respiratory status: spontaneous breathing, nonlabored ventilation, respiratory function stable and patient connected to nasal cannula oxygen Cardiovascular status: blood pressure returned to baseline and stable Postop Assessment: no apparent nausea or vomiting Anesthetic complications: no   No notable events documented.   Last Vitals:  Vitals:   04/17/23 1100 04/17/23 1110  BP: 134/80 (!) 143/79  Pulse: 61 61  Resp: 17 15  Temp:    SpO2:      Last Pain:  Vitals:   04/17/23 1050  TempSrc: Temporal                 Cleda Mccreedy Treanna Dumler

## 2023-04-17 NOTE — Anesthesia Preprocedure Evaluation (Signed)
Anesthesia Evaluation  Patient identified by MRN, date of birth, ID band Patient awake    Reviewed: Allergy & Precautions, NPO status , Patient's Chart, lab work & pertinent test results  History of Anesthesia Complications Negative for: history of anesthetic complications  Airway Mallampati: III  TM Distance: >3 FB Neck ROM: full    Dental  (+) Chipped   Pulmonary neg pulmonary ROS, neg shortness of breath   Pulmonary exam normal        Cardiovascular Exercise Tolerance: Good hypertension, (-) angina Normal cardiovascular exam     Neuro/Psych  Neuromuscular disease  negative psych ROS   GI/Hepatic Neg liver ROS,GERD  Controlled,,  Endo/Other  diabetesHypothyroidism    Renal/GU negative Renal ROS  negative genitourinary   Musculoskeletal   Abdominal   Peds  Hematology negative hematology ROS (+)   Anesthesia Other Findings Past Medical History: 03/13/2015: Acquired hypothyroidism No date: Cramp in muscle     Comment:  H/O OF MUSCLE CRAMP IN CHEST AND STOMACH OCCAS WHEN               BENDS AT WAIST. WHEN STANDS AND BENDS BACK HEARS POP.               CRAMP GOES AWAY. HAS NOT HAPPENES IN MANY MONTHS No date: Diabetes mellitus without complication (HCC) 05/31/2014: Dyslipidemia associated with type 2 diabetes mellitus (HCC) No date: Hyperlipidemia No date: Hypertension 03/31/2017: Left ear impacted cerumen  Past Surgical History: 08/10/2013: ABDOMINAL HYSTERECTOMY     Comment:  pathology report contains cervix 05/2015: BREAST BIOPSY; Right     Comment:  Pathology revealed stromal fibrosis with intra atrophic               benign x2 areas 09/13/2016: BREAST BIOPSY; Right     Comment:  Procedure: BREAST BIOPSY WITH NEEDLE LOCALIZATION;                Surgeon: Earline Mayotte, MD;  Location: [facility] ORS;                Service: General;  Laterality: Right; 06/16/2016: BREAST EXCISIONAL BIOPSY; Right     Comment:   lumpectomy complext sclerosing lesion, benign  BMI    Body Mass Index: 27.64 kg/m      Reproductive/Obstetrics negative OB ROS                             Anesthesia Physical Anesthesia Plan  ASA: 3  Anesthesia Plan: General   Post-op Pain Management:    Induction: Intravenous  PONV Risk Score and Plan: Propofol infusion and TIVA  Airway Management Planned: Natural Airway and Nasal Cannula  Additional Equipment:   Intra-op Plan:   Post-operative Plan:   Informed Consent: I have reviewed the patients History and Physical, chart, labs and discussed the procedure including the risks, benefits and alternatives for the proposed anesthesia with the patient or authorized representative who has indicated his/her understanding and acceptance.     Dental Advisory Given  Plan Discussed with: Anesthesiologist, CRNA and Surgeon  Anesthesia Plan Comments: (Patient consented for risks of anesthesia including but not limited to:  - adverse reactions to medications - risk of airway placement if required - damage to eyes, teeth, lips or other oral mucosa - nerve damage due to positioning  - sore throat or hoarseness - Damage to heart, brain, nerves, lungs, other parts of body or loss of life  Patient voiced understanding  and assent.)       Anesthesia Quick Evaluation

## 2023-04-17 NOTE — Transfer of Care (Signed)
Immediate Anesthesia Transfer of Care Note  Patient: Valerie Manning  Procedure(s) Performed: Procedure(s): COLONOSCOPY WITH PROPOFOL (N/A)  Patient Location: PACU and Endoscopy Unit  Anesthesia Type:General  Level of Consciousness: sedated  Airway & Oxygen Therapy: Patient Spontanous Breathing and Patient connected to nasal cannula oxygen  Post-op Assessment: Report given to RN and Post -op Vital signs reviewed and stable  Post vital signs: Reviewed and stable  Last Vitals:  Vitals:   04/17/23 1010 04/17/23 1050  BP: (!) 167/83 117/78  Pulse: 65 80  Resp: 18 (!) 24  Temp: (!) 36 C   SpO2: 100% 99%    Complications: No apparent anesthesia complications

## 2023-04-18 ENCOUNTER — Encounter: Payer: Self-pay | Admitting: Family Medicine

## 2023-04-18 ENCOUNTER — Ambulatory Visit: Payer: Federal, State, Local not specified - PPO | Admitting: Family Medicine

## 2023-04-18 VITALS — BP 132/84 | HR 90 | Temp 97.9°F | Resp 16 | Ht 64.0 in | Wt 163.8 lb

## 2023-04-18 DIAGNOSIS — Z1211 Encounter for screening for malignant neoplasm of colon: Secondary | ICD-10-CM

## 2023-04-18 DIAGNOSIS — E785 Hyperlipidemia, unspecified: Secondary | ICD-10-CM | POA: Diagnosis not present

## 2023-04-18 DIAGNOSIS — Z7984 Long term (current) use of oral hypoglycemic drugs: Secondary | ICD-10-CM | POA: Diagnosis not present

## 2023-04-18 DIAGNOSIS — I10 Essential (primary) hypertension: Secondary | ICD-10-CM

## 2023-04-18 DIAGNOSIS — E1169 Type 2 diabetes mellitus with other specified complication: Secondary | ICD-10-CM

## 2023-04-18 DIAGNOSIS — I152 Hypertension secondary to endocrine disorders: Secondary | ICD-10-CM

## 2023-04-18 DIAGNOSIS — E039 Hypothyroidism, unspecified: Secondary | ICD-10-CM

## 2023-04-18 DIAGNOSIS — E1159 Type 2 diabetes mellitus with other circulatory complications: Secondary | ICD-10-CM

## 2023-04-18 MED ORDER — GLIPIZIDE ER 2.5 MG PO TB24: 2.5000 mg | ORAL_TABLET | Freq: Every day | ORAL | 3 refills | Status: DC

## 2023-04-18 MED ORDER — TELMISARTAN-HCTZ 80-25 MG PO TABS: 1.0000 | ORAL_TABLET | Freq: Every day | ORAL | 3 refills | Status: DC

## 2023-04-18 MED ORDER — ATORVASTATIN CALCIUM 20 MG PO TABS: 20.0000 mg | ORAL_TABLET | Freq: Every day | ORAL | 3 refills | Status: DC

## 2023-04-18 MED ORDER — PIOGLITAZONE HCL 15 MG PO TABS: 15.0000 mg | ORAL_TABLET | Freq: Every day | ORAL | 3 refills | Status: DC

## 2023-04-18 MED ORDER — LEVOTHYROXINE SODIUM 150 MCG PO TABS: ORAL_TABLET | ORAL | 3 refills | Status: DC

## 2023-04-18 MED ORDER — METFORMIN HCL ER 750 MG PO TB24: 1500.0000 mg | ORAL_TABLET | Freq: Every day | ORAL | 3 refills | Status: DC

## 2023-05-08 ENCOUNTER — Telehealth: Payer: Self-pay

## 2023-05-08 NOTE — Telephone Encounter (Signed)
 Left vm to schedule orthotic pick up

## 2023-06-05 ENCOUNTER — Telehealth: Payer: Self-pay

## 2023-06-05 NOTE — Telephone Encounter (Signed)
 Taking orthos to [facility] for her appt

## 2023-06-06 ENCOUNTER — Ambulatory Visit: Payer: Federal, State, Local not specified - PPO

## 2023-06-06 NOTE — Progress Notes (Signed)
 Patient presents today to pick up custom molded foot orthotics, diagnosed with Capsulitis and Pes planus by Dr. Tobie.   Orthotics were dispensed and fit was satisfactory. Reviewed instructions for break-in and wear. Written instructions given to patient.  Patient will follow up as needed.   Lolita Schultze CPed, CFo, CFM

## 2023-08-12 ENCOUNTER — Other Ambulatory Visit: Payer: Self-pay | Admitting: Family Medicine

## 2023-08-12 DIAGNOSIS — I10 Essential (primary) hypertension: Secondary | ICD-10-CM

## 2023-08-12 DIAGNOSIS — I152 Hypertension secondary to endocrine disorders: Secondary | ICD-10-CM

## 2023-08-14 ENCOUNTER — Other Ambulatory Visit: Payer: Self-pay | Admitting: Family Medicine

## 2023-08-14 DIAGNOSIS — E1169 Type 2 diabetes mellitus with other specified complication: Secondary | ICD-10-CM

## 2023-08-14 DIAGNOSIS — E1159 Type 2 diabetes mellitus with other circulatory complications: Secondary | ICD-10-CM

## 2023-08-14 DIAGNOSIS — I10 Essential (primary) hypertension: Secondary | ICD-10-CM

## 2023-08-15 MED ORDER — TELMISARTAN-HCTZ 80-25 MG PO TABS: 1.0000 | ORAL_TABLET | Freq: Every day | ORAL | 0 refills | Status: DC

## 2023-08-15 MED ORDER — METFORMIN HCL ER 750 MG PO TB24: 1500.0000 mg | ORAL_TABLET | Freq: Every day | ORAL | 0 refills | Status: DC

## 2023-08-15 NOTE — Telephone Encounter (Signed)
 Copied from CRM [id number]. Topic: Clinical - Prescription Issue >> Aug 15, 2023  9:09 AM Zipporah Him wrote: Reason for CRM: glipiZIDE  (GLUCOTROL  XL) 2.5 MG 24 hr tablet, telmisartan -hydrochlorothiazide  (MICARDIS  HCT) 80-25 MG tablet. Patient needs a refill, prescription refill was denied due to needing an appointment. Although, she is scheduled for next week. Hoping to get maybe just 5 of each of this pills sent to the pharmacy to last until her appointment.

## 2023-08-19 ENCOUNTER — Ambulatory Visit: Payer: Self-pay | Admitting: Family Medicine

## 2023-08-19 ENCOUNTER — Encounter: Payer: Self-pay | Admitting: Family Medicine

## 2023-08-19 VITALS — BP 130/74 | HR 88 | Resp 16 | Ht 64.0 in | Wt 163.7 lb

## 2023-08-19 DIAGNOSIS — E1169 Type 2 diabetes mellitus with other specified complication: Secondary | ICD-10-CM

## 2023-08-19 DIAGNOSIS — J301 Allergic rhinitis due to pollen: Secondary | ICD-10-CM | POA: Diagnosis not present

## 2023-08-19 DIAGNOSIS — K2100 Gastro-esophageal reflux disease with esophagitis, without bleeding: Secondary | ICD-10-CM

## 2023-08-19 DIAGNOSIS — E1159 Type 2 diabetes mellitus with other circulatory complications: Secondary | ICD-10-CM

## 2023-08-19 DIAGNOSIS — E039 Hypothyroidism, unspecified: Secondary | ICD-10-CM

## 2023-08-19 DIAGNOSIS — Z7984 Long term (current) use of oral hypoglycemic drugs: Secondary | ICD-10-CM

## 2023-08-19 DIAGNOSIS — E785 Hyperlipidemia, unspecified: Secondary | ICD-10-CM | POA: Diagnosis not present

## 2023-08-19 DIAGNOSIS — I152 Hypertension secondary to endocrine disorders: Secondary | ICD-10-CM

## 2023-08-19 LAB — POCT GLYCOSYLATED HEMOGLOBIN (HGB A1C): Hemoglobin A1C: 5.9 % — AB (ref 4.0–5.6)

## 2023-08-19 MED ORDER — ATORVASTATIN CALCIUM 20 MG PO TABS: 20.0000 mg | ORAL_TABLET | Freq: Every day | ORAL | 3 refills | Status: DC

## 2023-08-19 MED ORDER — METFORMIN HCL ER 750 MG PO TB24: 1500.0000 mg | ORAL_TABLET | Freq: Every day | ORAL | 3 refills | Status: DC

## 2023-08-19 MED ORDER — FLUTICASONE PROPIONATE 50 MCG/ACT NA SUSP
2.0000 | Freq: Every day | NASAL | 2 refills | Status: DC
Start: 2023-08-19 — End: 2023-12-19

## 2023-08-19 MED ORDER — LORATADINE 10 MG PO TABS: 10.0000 mg | ORAL_TABLET | Freq: Every day | ORAL | 3 refills | Status: DC

## 2023-08-19 MED ORDER — TELMISARTAN-HCTZ 80-25 MG PO TABS: 1.0000 | ORAL_TABLET | Freq: Every day | ORAL | 3 refills | Status: DC

## 2023-08-19 MED ORDER — PIOGLITAZONE HCL 15 MG PO TABS: 15.0000 mg | ORAL_TABLET | Freq: Every day | ORAL | 3 refills | Status: DC

## 2023-08-19 MED ORDER — LEVOTHYROXINE SODIUM 150 MCG PO TABS
ORAL_TABLET | ORAL | 3 refills | Status: DC
Start: 2023-08-19 — End: 2023-12-19

## 2023-08-19 MED ORDER — GLIPIZIDE ER 2.5 MG PO TB24: 2.5000 mg | ORAL_TABLET | Freq: Every day | ORAL | 3 refills | Status: DC

## 2023-08-19 NOTE — Progress Notes (Signed)
 Name: Valerie Manning   MRN: [id number]    DOB: October 01, 1965   Date:08/19/2023       Progress Note  Subjective  Chief Complaint  Chief Complaint  Patient presents with   Medical Management of Chronic Issues   Discussed the use of AI scribe software for clinical note transcription with the patient, who gave verbal consent to proceed.  History of Present Illness She is a 58 year old female who presents for a regular follow-up visit.  She experiences symptoms of seasonal allergic rhinitis, including rhinorrhea, pruritic eyes, and a dry cough, which she attributes to recent outdoor activities such as yard work and riding with the top down. She uses an over-the-counter allergy medication and Flonase  nasal spray, though she is unsure of the specific allergy medication. No wheezing, fever, or chills.  Her diabetes management has improved, with her A1c decreasing from 7.9% in December to 5.9%. She attributes this to medication adjustments, including metformin , pioglitazone , and a low dose of glipizide . No symptoms of hypoglycemia and she feels good overall.  Her blood pressure is well controlled at 130/74 mmHg with telmisartan  HCTZ 80/25 mg. No side effects such as dizziness or orthostatic changes.  Her dyslipidemia is managed with atorvastatin  20 mg, and her LDL was last recorded at 58 mg/dL. No muscle aches or chest pain.  She is on levothyroxine  150 mcg daily for thyroid  management and reports no symptoms such as hair loss, bowel changes, or xerosis. Her last TSH was within normal limits.  She maintains an active lifestyle by doing her own yard and housework, though she has not started any new exercise routines due to being busy with work. No current issues with reflux, stating she has not needed medication for it recently.   Patient Active Problem List   Diagnosis Date Noted   Seasonal allergic rhinitis due to pollen 08/19/2023   Encounter for screening colonoscopy 04/17/2023   Dyslipidemia  associated with type 2 diabetes mellitus (HCC) 11/29/2022   Carpal tunnel syndrome, right 10/29/2021   Hypertension associated with type 2 diabetes mellitus (HCC) 10/29/2021   Hypertension, benign 10/29/2021   Adhesive capsulitis of left shoulder 05/12/2019   Lipoma of neck 02/27/2016   Status post abdominal hysterectomy and left salpingo-oophorectomy 10/25/2015   Acquired hypothyroidism 03/13/2015   History of iron deficiency anemia 03/13/2015   Hypertension 08/15/2014   Type 2 diabetes mellitus with hyperglycemia, without long-term current use of insulin (HCC) 05/31/2014   GERD (gastroesophageal reflux disease) 05/31/2014   Hyperlipidemia 05/31/2014    Past Surgical History:  Procedure Laterality Date   ABDOMINAL HYSTERECTOMY  08/10/2013   pathology report contains cervix   BREAST BIOPSY Right 05/2015   Pathology revealed stromal fibrosis with intra atrophic benign x2 areas   BREAST BIOPSY Right 09/13/2016   Procedure: BREAST BIOPSY WITH NEEDLE LOCALIZATION;  Surgeon: Marshall Skeeter, MD;  Location: [facility] OR[facility];  Service: General;  Laterality: Right;   BREAST EXCISIONAL BIOPSY Right 06/16/2016   lumpectomy complext sclerosing lesion, benign   COLONOSCOPY WITH PROPOFOL  N/A 04/17/2023   Procedure: COLONOSCOPY WITH PROPOFOL ;  Surgeon: Marnee Sink, MD;  Location: [facility];  Service: Endoscopy;  Laterality: N/A;    Family History  Problem Relation Age of Onset   Diabetes Mother    Diabetes Sister    Heart disease Sister    Diabetes Sister    Diabetes Sister    Thyroid  disease Sister    Diabetes Sister    Thyroid  disease Sister    Thyroid  disease Sister  Heart disease Father    Cancer Neg Hx    Breast cancer Neg Hx     Social History   Tobacco Use   Smoking status: Never   Smokeless tobacco: Never  Substance Use Topics   Alcohol use: No    Alcohol/week: 0.0 standard drinks of alcohol     Current Outpatient Medications:    blood glucose meter kit and  supplies, Dispense based on patient and insurance preference. Use up to four times daily as directed. (FOR ICD-10 E10.9, E11.9)., Disp: 1 each, Rfl: 0   fluticasone  (FLONASE ) 50 MCG/ACT nasal spray, Place 2 sprays into both nostrils daily., Disp: 16 g, Rfl: 2   glucose blood (ONETOUCH VERIO) test strip, Use as instructed, Disp: 100 each, Rfl: 12   Multiple Vitamin (MULTIVITAMIN) capsule, Take 1 capsule by mouth daily., Disp: , Rfl:    atorvastatin  (LIPITOR) 20 MG tablet, Take 1 tablet (20 mg total) by mouth daily., Disp: 30 tablet, Rfl: 3   glipiZIDE  (GLUCOTROL  XL) 2.5 MG 24 hr tablet, Take 1 tablet (2.5 mg total) by mouth daily with breakfast., Disp: 30 tablet, Rfl: 3   levothyroxine  (SYNTHROID ) 150 MCG tablet, TAKE 1 TABLET(150 MCG) BY MOUTH DAILY BEFORE BREAKFAST, Disp: 30 tablet, Rfl: 3   loratadine  (CLARITIN ) 10 MG tablet, Take 1 tablet (10 mg total) by mouth daily., Disp: 30 tablet, Rfl: 3   metFORMIN  (GLUCOPHAGE -XR) 750 MG 24 hr tablet, Take 2 tablets (1,500 mg total) by mouth daily with breakfast., Disp: 60 tablet, Rfl: 3   pioglitazone  (ACTOS ) 15 MG tablet, Take 1 tablet (15 mg total) by mouth daily., Disp: 30 tablet, Rfl: 3   telmisartan -hydrochlorothiazide  (MICARDIS  HCT) 80-25 MG tablet, Take 1 tablet by mouth daily., Disp: 30 tablet, Rfl: 3  No Known Allergies  I personally reviewed active problem list, medication list, allergies, family history with the patient/caregiver today.   ROS  Ten systems reviewed and is negative except as mentioned in HPI    Objective Physical Exam Constitutional: Patient appears well-developed and well-nourished.  No distress.  HEENT: head atraumatic, normocephalic, pupils equal and reactive to light, neck supple Cardiovascular: Normal rate, regular rhythm and normal heart sounds.  No murmur heard. No BLE edema. Pulmonary/Chest: Effort normal and breath sounds normal. No respiratory distress. Abdominal: Soft.  There is no tenderness. Psychiatric:  Patient has a normal mood and affect. behavior is normal. Judgment and thought content normal.    Vitals:   08/19/23 0814 08/19/23 0845  BP: 130/74   Pulse: (!) 101 88  Resp: 16   SpO2: 94%   Weight: 163 lb 11.2 oz (74.3 kg)   Height: 5\' 4"  (1.626 m)     Body mass index is 28.1 kg/m.  Recent Results (from the past 2160 hours)  POCT glycosylated hemoglobin (Hb A1C)     Status: Abnormal   Collection Time: 08/19/23  8:18 AM  Result Value Ref Range   Hemoglobin A1C 5.9 (A) 4.0 - 5.6 %   HbA1c POC (<> result, manual entry)     HbA1c, POC (prediabetic range)     HbA1c, POC (controlled diabetic range)      Diabetic Foot Exam:     PHQ2/9:    08/19/2023    8:07 AM 03/31/2023    8:05 AM 02/07/2023    9:38 AM 11/29/2022    8:38 AM 07/30/2022    9:22 AM  Depression screen PHQ 2/9  Decreased Interest 0 0 0 0 0  Down, Depressed, Hopeless 0 0 0  0 0  PHQ - 2 Score 0 0 0 0 0  Altered sleeping 0 0  0 0  Tired, decreased energy 0 0  0 0  Change in appetite 0 0  0 0  Feeling bad or failure about yourself  0 0  0 0  Trouble concentrating 0 0  0 0  Moving slowly or fidgety/restless 0 0  0 0  Suicidal thoughts 0 0  0 0  PHQ-9 Score 0 0  0 0  Difficult doing work/chores Not difficult at all Not difficult at all  Not difficult at all     phq 9 is negative  Fall Risk:    03/31/2023    7:59 AM 02/07/2023    9:37 AM 11/29/2022    8:38 AM 08/26/2022    3:19 PM 07/30/2022    9:22 AM  Fall Risk   Falls in the past year? 0 0 0 0 0  Number falls in past yr: 0 0 0  0  Injury with Fall? 0 0 0  0  Risk for fall due to : No Fall Risks No Fall Risks No Fall Risks No Fall Risks No Fall Risks  Follow up Falls prevention discussed;Education provided;Falls evaluation completed Falls prevention discussed Falls prevention discussed;Education provided;Falls evaluation completed Falls prevention discussed Falls prevention discussed    Assessment & Plan  Hypertension Hypertension well controlled with  telmisartan  HCTZ. No orthostatic changes or side effects. - Continue telmisartan  HCTZ 80/25 mg daily.  Type 2 diabetes mellitus associated with dyslipidemia and HTN Type 2 diabetes well controlled with A1c at 5.9%. No hypoglycemic episodes. Advised to adjust glipizide  based on diet and activity. - Continue metformin  XR 750 mg twice daily and pioglitazone  15 mg daily. - Use glipizide  2.5 mg as needed based on dietary intake and activity level. - Monitor for signs of hypoglycemia and adjust glipizide  accordingly.  Dyslipidemia Dyslipidemia well controlled with atorvastatin . No side effects reported. - Continue atorvastatin  20 mg daily.  Hypothyroidism Hypothyroidism well controlled with levothyroxine . TSH levels stable. No symptoms reported. - Continue levothyroxine  150 mcg daily.  Seasonal allergic rhinitis Symptoms of rhinorrhea, pruritic eyes, and dry cough likely due to pollen. Using OTC allergy medication and fluticasone  nasal spray. - Prescribe fluticasone  nasal spray (Flonase ) with instructions to use one spray in each nostril twice daily, then reduce to once daily as symptoms improve. - Prescribe loratadine  10 mg daily, with the option to increase to twice daily if symptoms are severe.

## 2023-09-03 DIAGNOSIS — H524 Presbyopia: Secondary | ICD-10-CM | POA: Diagnosis not present

## 2023-09-03 DIAGNOSIS — H40013 Open angle with borderline findings, low risk, bilateral: Secondary | ICD-10-CM | POA: Diagnosis not present

## 2023-09-03 DIAGNOSIS — E119 Type 2 diabetes mellitus without complications: Secondary | ICD-10-CM | POA: Diagnosis not present

## 2023-09-04 LAB — HM DIABETES EYE EXAM

## 2023-12-10 ENCOUNTER — Encounter: Payer: Self-pay | Admitting: Nurse Practitioner

## 2023-12-10 ENCOUNTER — Ambulatory Visit: Admitting: Nurse Practitioner

## 2023-12-10 ENCOUNTER — Ambulatory Visit: Payer: Self-pay

## 2023-12-10 VITALS — BP 122/74 | HR 98 | Temp 98.5°F | Resp 18 | Ht 64.0 in | Wt 168.3 lb

## 2023-12-10 DIAGNOSIS — B379 Candidiasis, unspecified: Secondary | ICD-10-CM | POA: Diagnosis not present

## 2023-12-10 DIAGNOSIS — J020 Streptococcal pharyngitis: Secondary | ICD-10-CM | POA: Diagnosis not present

## 2023-12-10 DIAGNOSIS — T3695XA Adverse effect of unspecified systemic antibiotic, initial encounter: Secondary | ICD-10-CM | POA: Diagnosis not present

## 2023-12-10 DIAGNOSIS — R051 Acute cough: Secondary | ICD-10-CM | POA: Diagnosis not present

## 2023-12-10 DIAGNOSIS — J029 Acute pharyngitis, unspecified: Secondary | ICD-10-CM

## 2023-12-10 LAB — POCT RAPID STREP A (OFFICE): Rapid Strep A Screen: NEGATIVE

## 2023-12-10 MED ORDER — AMOXICILLIN 500 MG PO CAPS: 500.0000 mg | ORAL_CAPSULE | Freq: Two times a day (BID) | ORAL | 0 refills | Status: AC

## 2023-12-10 MED ORDER — BENZONATATE 100 MG PO CAPS
200.0000 mg | ORAL_CAPSULE | Freq: Two times a day (BID) | ORAL | 0 refills | Status: AC | PRN
Start: 2023-12-10 — End: ?

## 2023-12-10 MED ORDER — FLUCONAZOLE 150 MG PO TABS: 150.0000 mg | ORAL_TABLET | ORAL | 0 refills | Status: DC | PRN

## 2023-12-10 NOTE — Telephone Encounter (Signed)
 FYI Only or Action Required?: FYI only for provider.  Patient was last seen in primary care on 08/19/2023 by Glenard Mire, MD.  Called Nurse Triage reporting Sore Throat.  Symptoms began several days ago.  Interventions attempted: Nothing.  Symptoms are: unchanged.  Triage Disposition: See Physician Within 24 Hours  Patient/caregiver understands and will follow disposition?: Yes, will follow disposition.  Copied from CRM [id number]. Topic: Clinical - Red Word Triage >> Dec 10, 2023  8:07 AM Marissa P wrote: Red Word that prompted transfer to Nurse Triage: Patient has been experiencing bad cough for 4 days now as well as a sore throat.  Mainly feels pain when swallowing or coughing. Reason for Disposition  SEVERE throat pain (e.g., excruciating)  Answer Assessment - Initial Assessment Questions 1. ONSET: When did the throat start hurting? (Hours or days ago)      4 days ago 2. SEVERITY: How bad is the sore throat? (Scale 1-10; mild, moderate or severe)     9 3. STREP EXPOSURE: Has there been any exposure to strep within the past week? If Yes, ask: What type of contact occurred?      unsure 4.  VIRAL SYMPTOMS: Are there any symptoms of a cold, such as a runny nose, cough, hoarse voice or red eyes?      cough 5. FEVER: Do you have a fever? If Yes, ask: What is your temperature, how was it measured, and when did it start?     denies 6. PUS ON THE TONSILS: Is there pus on the tonsils in the back of your throat?     denies 7. OTHER SYMPTOMS: Do you have any other symptoms? (e.g., difficulty breathing, headache, rash)     denies  Protocols used: Sore Throat-A-AH

## 2023-12-10 NOTE — Progress Notes (Signed)
 BP 122/74   Pulse 98   Temp 98.5 F (36.9 C)   Resp 18   Ht 5' 4 (1.626 m)   Wt 168 lb 4.8 oz (76.3 kg)   SpO2 98%   BMI 28.89 kg/m    Subjective:    Patient ID: Valerie Manning, female    DOB: 25-Apr-1966, 58 y.o.   MRN: [id number]  HPI: Valerie Manning is a 58 y.o. female  Chief Complaint  Patient presents with   URI    Sore throat and cough x4 days    Discussed the use of AI scribe software for clinical note transcription with the patient, who gave verbal consent to proceed.  History of Present Illness Valerie Manning is a 58 year old female who presents with a sore throat and cough for four days.  Pharyngitis and cough - Sore throat and dry cough for four days, with simultaneous onset - Sore throat is very painful - Cough is severe enough to disrupt sleep - No fever - No nasal congestion - Cough drops with honey provide some symptom relief  Antibiotic-associated yeast infections - History of yeast infections with antibiotic use - Unable to recall which antibiotic caused the reaction         08/19/2023    8:07 AM 03/31/2023    8:05 AM 02/07/2023    9:38 AM  Depression screen PHQ 2/9  Decreased Interest 0 0 0  Down, Depressed, Hopeless 0 0 0  PHQ - 2 Score 0 0 0  Altered sleeping 0 0   Tired, decreased energy 0 0   Change in appetite 0 0   Feeling bad or failure about yourself  0 0   Trouble concentrating 0 0   Moving slowly or fidgety/restless 0 0   Suicidal thoughts 0 0   PHQ-9 Score 0 0   Difficult doing work/chores Not difficult at all Not difficult at all     Relevant past medical, surgical, family and social history reviewed and updated as indicated. Interim medical history since our last visit reviewed. Allergies and medications reviewed and updated.  Review of Systems  Ten systems reviewed and is negative except as mentioned in HPI      Objective:     BP 122/74   Pulse 98   Temp 98.5 F (36.9 C)   Resp 18   Ht 5' 4 (1.626 m)   Wt  168 lb 4.8 oz (76.3 kg)   SpO2 98%   BMI 28.89 kg/m    Wt Readings from Last 3 Encounters:  12/10/23 168 lb 4.8 oz (76.3 kg)  08/19/23 163 lb 11.2 oz (74.3 kg)  04/18/23 163 lb 12.8 oz (74.3 kg)    Physical Exam Physical Exam GENERAL: Alert, cooperative, well developed, no acute distress HEENT: Normocephalic, normal oropharynx, moist mucous membranes, throat erythematous with exudate, ears normal, cervical adenopathy CHEST: Clear to auscultation bilaterally, no wheezes, rhonchi, or crackles CARDIOVASCULAR: Normal heart rate and rhythm, S1 and S2 normal without murmurs ABDOMEN: Soft, non-tender, non-distended, without organomegaly, normal bowel sounds EXTREMITIES: No cyanosis or edema NEUROLOGICAL: Cranial nerves grossly intact, moves all extremities without gross motor or sensory deficit   Results for orders placed or performed in visit on 12/10/23  POCT rapid strep A   Collection Time: 12/10/23 11:08 AM  Result Value Ref Range   Rapid Strep A Screen Negative Negative          Assessment & Plan:   Problem List Items Addressed  This Visit   None Visit Diagnoses       Sore throat    -  Primary   Relevant Orders   POCT rapid strep A (Completed)     Acute cough       Relevant Medications   benzonatate  (TESSALON ) 100 MG capsule     Strep pharyngitis       Relevant Medications   amoxicillin  (AMOXIL ) 500 MG capsule   fluconazole  (DIFLUCAN ) 150 MG tablet     Antibiotic-induced yeast infection       Relevant Medications   fluconazole  (DIFLUCAN ) 150 MG tablet        Assessment and Plan Assessment & Plan Acute pharyngitis with cough Acute pharyngitis with a dry cough for four days. Throat appears very red and painful, resembling strep throat, despite a negative strep test. No fever or congestion reported. Lungs sound clear. No known allergies. History of yeast infections with antibiotics, but specific antibiotic not recalled. - Prescribe amoxicillin  500 mg twice daily  for 10 days. - Send prescription for Diflucan  to [facility] as a precaution for potential yeast infection due to antibiotics. - Advise wearing a mask if returning to work before 24 hours on antibiotics to prevent spread. - Recommend staying hydrated and drinking plenty of water. - Suggest eating soft foods and gargling with salt water for throat relief. - Encourage drinking tea with honey and using honey cough drops. - Send prescription for cough medication to [facility].        Follow up plan: Return if symptoms worsen or fail to improve.

## 2023-12-19 ENCOUNTER — Other Ambulatory Visit: Payer: Self-pay | Admitting: Family Medicine

## 2023-12-19 ENCOUNTER — Encounter: Payer: Self-pay | Admitting: Nurse Practitioner

## 2023-12-19 ENCOUNTER — Ambulatory Visit: Admitting: Nurse Practitioner

## 2023-12-19 ENCOUNTER — Other Ambulatory Visit: Payer: Self-pay

## 2023-12-19 VITALS — BP 130/80 | HR 77 | Temp 98.0°F | Resp 16 | Ht 64.0 in | Wt 168.1 lb

## 2023-12-19 DIAGNOSIS — E1169 Type 2 diabetes mellitus with other specified complication: Secondary | ICD-10-CM

## 2023-12-19 DIAGNOSIS — Z7984 Long term (current) use of oral hypoglycemic drugs: Secondary | ICD-10-CM

## 2023-12-19 DIAGNOSIS — E039 Hypothyroidism, unspecified: Secondary | ICD-10-CM | POA: Diagnosis not present

## 2023-12-19 DIAGNOSIS — E1159 Type 2 diabetes mellitus with other circulatory complications: Secondary | ICD-10-CM

## 2023-12-19 DIAGNOSIS — Z1231 Encounter for screening mammogram for malignant neoplasm of breast: Secondary | ICD-10-CM

## 2023-12-19 DIAGNOSIS — K219 Gastro-esophageal reflux disease without esophagitis: Secondary | ICD-10-CM

## 2023-12-19 DIAGNOSIS — E785 Hyperlipidemia, unspecified: Secondary | ICD-10-CM

## 2023-12-19 DIAGNOSIS — E1165 Type 2 diabetes mellitus with hyperglycemia: Secondary | ICD-10-CM | POA: Diagnosis not present

## 2023-12-19 DIAGNOSIS — J301 Allergic rhinitis due to pollen: Secondary | ICD-10-CM

## 2023-12-19 DIAGNOSIS — I152 Hypertension secondary to endocrine disorders: Secondary | ICD-10-CM

## 2023-12-19 LAB — POCT GLYCOSYLATED HEMOGLOBIN (HGB A1C): Hemoglobin A1C: 6 % — AB (ref 4.0–5.6)

## 2023-12-19 MED ORDER — METFORMIN HCL ER 750 MG PO TB24: 1500.0000 mg | ORAL_TABLET | Freq: Every day | ORAL | 1 refills | Status: DC

## 2023-12-19 MED ORDER — GLIPIZIDE ER 2.5 MG PO TB24: 2.5000 mg | ORAL_TABLET | Freq: Every day | ORAL | 1 refills | Status: DC

## 2023-12-19 MED ORDER — FLUTICASONE PROPIONATE 50 MCG/ACT NA SUSP
2.0000 | Freq: Every day | NASAL | 2 refills | Status: AC
Start: 2023-12-19 — End: ?

## 2023-12-19 MED ORDER — TELMISARTAN-HCTZ 80-25 MG PO TABS: 1.0000 | ORAL_TABLET | Freq: Every day | ORAL | 1 refills | Status: DC

## 2023-12-19 MED ORDER — LEVOTHYROXINE SODIUM 150 MCG PO TABS: ORAL_TABLET | ORAL | 1 refills | Status: AC

## 2023-12-19 MED ORDER — PIOGLITAZONE HCL 15 MG PO TABS: 15.0000 mg | ORAL_TABLET | Freq: Every day | ORAL | 1 refills | Status: DC

## 2023-12-19 MED ORDER — LORATADINE 10 MG PO TABS
10.0000 mg | ORAL_TABLET | Freq: Every day | ORAL | 1 refills | Status: AC
Start: 2023-12-19 — End: ?

## 2023-12-19 MED ORDER — ATORVASTATIN CALCIUM 20 MG PO TABS: 20.0000 mg | ORAL_TABLET | Freq: Every day | ORAL | 1 refills | Status: DC

## 2023-12-19 NOTE — Progress Notes (Signed)
 BP 130/80 (Cuff Size: Large)   Pulse 77   Temp 98 F (36.7 C) (Oral)   Resp 16   Ht 5' 4 (1.626 m)   Wt 168 lb 1.6 oz (76.2 kg)   SpO2 98%   BMI 28.85 kg/m    Subjective:    Patient ID: Valerie Manning, female    DOB: Jan 22, 1966, 58 y.o.   MRN: [id number]  HPI: Valerie Manning is a 58 y.o. female  Chief Complaint  Patient presents with   Medical Management of Chronic Issues    4 month follow up    Discussed the use of AI scribe software for clinical note transcription with the patient, who gave verbal consent to proceed.  History of Present Illness Valerie Manning is a 58 year old female who presents for a routine four-month follow-up.  Hypertension - Blood pressure measured at 122/74 at last visit - Recent blood pressure measured at 130/80 after consuming fried chicken the night before - No significant issues with blood pressure recently -currently taking telmisartan -hydrochlorothiazide  80-25 mg daily  Type 2 diabetes mellitus - Does not regularly monitor blood glucose -currently taking metformin  1500 mg daily, glipizide  2.5 mg daily and actos  15 mg daily  Dyslipidemia - Currently taking atorvastatin  20 mg daily  Hypothyroidism - Currently taking levothyroxine  150 mcg daily  Allergic rhinitis - Currently taking Flonase  daily and loratadine  10 mg daily  Gastroesophageal reflux disease (gerd) - stable  Preventive health maintenance - Overdue for mammogram, last performed July 2023 - Received shingles vaccine  Dermatologic symptoms - Occasional pruritus around waist         12/19/2023    7:44 AM 08/19/2023    8:07 AM 03/31/2023    8:05 AM  Depression screen PHQ 2/9  Decreased Interest 0 0 0  Down, Depressed, Hopeless 0 0 0  PHQ - 2 Score 0 0 0  Altered sleeping  0 0  Tired, decreased energy  0 0  Change in appetite  0 0  Feeling bad or failure about yourself   0 0  Trouble concentrating  0 0  Moving slowly or fidgety/restless  0 0  Suicidal  thoughts  0 0  PHQ-9 Score  0 0  Difficult doing work/chores  Not difficult at all Not difficult at all    Relevant past medical, surgical, family and social history reviewed and updated as indicated. Interim medical history since our last visit reviewed. Allergies and medications reviewed and updated.  Review of Systems  Constitutional: Negative for fever or weight change.  Respiratory: Negative for cough and shortness of breath.   Cardiovascular: Negative for chest pain or palpitations.  Gastrointestinal: Negative for abdominal pain, no bowel changes.  Musculoskeletal: Negative for gait problem or joint swelling.  Skin: Negative for rash.  Neurological: Negative for dizziness or headache.  No other specific complaints in a complete review of systems (except as listed in HPI above).      Objective:     BP 130/80 (Cuff Size: Large)   Pulse 77   Temp 98 F (36.7 C) (Oral)   Resp 16   Ht 5' 4 (1.626 m)   Wt 168 lb 1.6 oz (76.2 kg)   SpO2 98%   BMI 28.85 kg/m    Wt Readings from Last 3 Encounters:  12/19/23 168 lb 1.6 oz (76.2 kg)  12/10/23 168 lb 4.8 oz (76.3 kg)  08/19/23 163 lb 11.2 oz (74.3 kg)    Physical Exam Physical Exam  GENERAL: Alert, cooperative, well developed, no acute distress HEENT: Normocephalic, normal oropharynx, moist mucous membranes CHEST: Clear to auscultation bilaterally, no wheezes, rhonchi, or crackles CARDIOVASCULAR: Normal heart rate and rhythm, S1 and S2 normal without murmurs ABDOMEN: Soft, non-tender, non-distended, without organomegaly, normal bowel sounds EXTREMITIES: No cyanosis or edema NEUROLOGICAL: Cranial nerves grossly intact, moves all extremities without gross motor or sensory deficit   Results for orders placed or performed in visit on 12/19/23  POCT HgB A1C   Collection Time: 12/19/23  7:54 AM  Result Value Ref Range   Hemoglobin A1C 6.0 (A) 4.0 - 5.6 %   HbA1c POC (<> result, manual entry)     HbA1c, POC (prediabetic  range)     HbA1c, POC (controlled diabetic range)            Assessment & Plan:   Problem List Items Addressed This Visit       Cardiovascular and Mediastinum   Hypertension associated with type 2 diabetes mellitus (HCC) - Primary   Relevant Medications   atorvastatin  (LIPITOR) 20 MG tablet   glipiZIDE  (GLUCOTROL  XL) 2.5 MG 24 hr tablet   metFORMIN  (GLUCOPHAGE -XR) 750 MG 24 hr tablet   pioglitazone  (ACTOS ) 15 MG tablet   telmisartan -hydrochlorothiazide  (MICARDIS  HCT) 80-25 MG tablet     Respiratory   Seasonal allergic rhinitis due to pollen   Relevant Medications   fluticasone  (FLONASE ) 50 MCG/ACT nasal spray   loratadine  (CLARITIN ) 10 MG tablet     Digestive   GERD (gastroesophageal reflux disease)     Endocrine   Acquired hypothyroidism   Relevant Medications   levothyroxine  (SYNTHROID ) 150 MCG tablet   Type 2 diabetes mellitus with hyperglycemia, without long-term current use of insulin (HCC)   Relevant Medications   atorvastatin  (LIPITOR) 20 MG tablet   glipiZIDE  (GLUCOTROL  XL) 2.5 MG 24 hr tablet   metFORMIN  (GLUCOPHAGE -XR) 750 MG 24 hr tablet   pioglitazone  (ACTOS ) 15 MG tablet   telmisartan -hydrochlorothiazide  (MICARDIS  HCT) 80-25 MG tablet   Other Relevant Orders   POCT HgB A1C (Completed)   Dyslipidemia associated with type 2 diabetes mellitus (HCC)   Relevant Medications   atorvastatin  (LIPITOR) 20 MG tablet   glipiZIDE  (GLUCOTROL  XL) 2.5 MG 24 hr tablet   metFORMIN  (GLUCOPHAGE -XR) 750 MG 24 hr tablet   pioglitazone  (ACTOS ) 15 MG tablet   telmisartan -hydrochlorothiazide  (MICARDIS  HCT) 80-25 MG tablet     Other   Hyperlipidemia   Relevant Medications   atorvastatin  (LIPITOR) 20 MG tablet   telmisartan -hydrochlorothiazide  (MICARDIS  HCT) 80-25 MG tablet   Other Visit Diagnoses       Encounter for screening mammogram for malignant neoplasm of breast       Relevant Orders   MM 3D SCREENING MAMMOGRAM BILATERAL BREAST        Assessment and  Plan Assessment & Plan Type 2 diabetes mellitus Type 2 diabetes mellitus is well-controlled with an A1c of 6.0. No current issues with blood sugar levels, and she does not regularly check her blood sugar at home. - Continue glipizide  2.5 mg daily, metformin  1500 mg daily, Actos  15 mg daily. - Monitor for symptoms of hyperglycemia or hypoglycemia and check blood sugar if feeling unwell.  Essential hypertension Blood pressure is generally well-controlled with a recent reading of 130/80, though it was previously 122/74. Possible dietary influence noted with recent intake of salty foods. - Continue telmisartan -hydrochlorothiazide  80-25 mg daily.  Dyslipidemia Dyslipidemia is being managed with atorvastatin  20 mg daily. No new issues reported. - Continue atorvastatin  20  mg daily.  Hypothyroidism Hypothyroidism is well-managed with current treatment. Thyroid  function is typically checked every December, and no issues have been reported. - Continue levothyroxine  150 mcg daily. - Plan for thyroid  function test in December.   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 This is a common condition with diabetes, causing sensitive skin. - Use moisturizers like Aquaphor or CeraVe to hydrate the skin.        Follow up plan: Return in about 4 months (around 04/19/2024) for follow up with Dr. Glenard.

## 2024-01-09 ENCOUNTER — Ambulatory Visit
Admission: RE | Admit: 2024-01-09 | Discharge: 2024-01-09 | Disposition: A | Discharge status: Home / Self Care | Source: Ambulatory Visit | Attending: Family Medicine | Admitting: Family Medicine

## 2024-01-09 DIAGNOSIS — Z1231 Encounter for screening mammogram for malignant neoplasm of breast: Secondary | ICD-10-CM | POA: Insufficient documentation

## 2024-02-11 DIAGNOSIS — H40013 Open angle with borderline findings, low risk, bilateral: Secondary | ICD-10-CM | POA: Diagnosis not present

## 2024-04-20 ENCOUNTER — Ambulatory Visit: Admitting: Family Medicine

## 2024-04-20 ENCOUNTER — Encounter: Payer: Self-pay | Admitting: Family Medicine

## 2024-04-20 VITALS — BP 128/84 | HR 84 | Temp 98.3°F | Resp 16 | Ht 64.0 in | Wt 171.2 lb

## 2024-04-20 DIAGNOSIS — Z7984 Long term (current) use of oral hypoglycemic drugs: Secondary | ICD-10-CM

## 2024-04-20 DIAGNOSIS — I152 Hypertension secondary to endocrine disorders: Secondary | ICD-10-CM

## 2024-04-20 DIAGNOSIS — E1169 Type 2 diabetes mellitus with other specified complication: Secondary | ICD-10-CM | POA: Diagnosis not present

## 2024-04-20 DIAGNOSIS — K219 Gastro-esophageal reflux disease without esophagitis: Secondary | ICD-10-CM | POA: Diagnosis not present

## 2024-04-20 DIAGNOSIS — E1159 Type 2 diabetes mellitus with other circulatory complications: Secondary | ICD-10-CM | POA: Diagnosis not present

## 2024-04-20 DIAGNOSIS — E785 Hyperlipidemia, unspecified: Secondary | ICD-10-CM | POA: Diagnosis not present

## 2024-04-20 DIAGNOSIS — J301 Allergic rhinitis due to pollen: Secondary | ICD-10-CM | POA: Diagnosis not present

## 2024-04-20 DIAGNOSIS — E039 Hypothyroidism, unspecified: Secondary | ICD-10-CM

## 2024-04-20 MED ORDER — METFORMIN HCL ER 750 MG PO TB24: 1500.0000 mg | ORAL_TABLET | Freq: Every day | ORAL | 1 refills | Status: AC

## 2024-04-20 MED ORDER — PIOGLITAZONE HCL 15 MG PO TABS: 15.0000 mg | ORAL_TABLET | Freq: Every day | ORAL | 1 refills | Status: AC

## 2024-04-20 MED ORDER — ATORVASTATIN CALCIUM 20 MG PO TABS: 20.0000 mg | ORAL_TABLET | Freq: Every day | ORAL | 1 refills | Status: AC

## 2024-04-20 MED ORDER — TELMISARTAN-HCTZ 80-25 MG PO TABS: 1.0000 | ORAL_TABLET | Freq: Every day | ORAL | 1 refills | Status: AC

## 2024-04-20 MED ORDER — GLIPIZIDE ER 2.5 MG PO TB24: 2.5000 mg | ORAL_TABLET | Freq: Every day | ORAL | 1 refills | Status: AC

## 2024-04-20 NOTE — Progress Notes (Signed)
 Name: Valerie Manning   MRN: [id number]    DOB: 1966/03/24   Date:04/20/2024       Progress Note  Subjective  Chief Complaint  Chief Complaint  Patient presents with   Medical Management of Chronic Issues   Discussed the use of AI scribe software for clinical note transcription with the patient, who gave verbal consent to proceed.  History of Present Illness Valerie Manning is a 58 year old female who presents for a regular follow-up visit.  She is due for her annual lab work, including cholesterol and urine microalbumin tests. Her diabetes is managed with glipizide  2.5 mg XL in the morning, metformin  750 mg XR twice a day, and pioglitazone  15 mg daily. Her last A1c in August was 6%, improved from 7.9% in December of the previous year. No episodes of hypoglycemia, shakiness, excessive hunger, thirst, or increased urination.  Hypertension is associated with her diabetes, and she takes telmisartan /hydrochlorothiazide  80/25 mg. She does not regularly check her blood pressure but reports no headaches or chest pain. She notes that consuming salty foods like pizza may temporarily raise her blood pressure.  For dyslipidemia, she takes atorvastatin  20 mg daily and reports no muscle pain or side effects.   She has a history of yeast infections due to diabetes but currently does not require Diflucan .  Her allergies are managed with Flonase  and loratadine  as needed. No current allergy symptoms are reported.  Her hypothyroidism is treated with levothyroxine  150 mcg daily. She reports no significant hair loss or changes in bowel movements.  No current symptoms of reflux and she is not on medication for it.    Patient Active Problem List   Diagnosis Date Noted   Seasonal allergic rhinitis due to pollen 08/19/2023   Dyslipidemia associated with type 2 diabetes mellitus (HCC) 11/29/2022   Carpal tunnel syndrome, right 10/29/2021   Hypertension associated with type 2 diabetes mellitus (HCC) 10/29/2021    Adhesive capsulitis of left shoulder 05/12/2019   Lipoma of neck 02/27/2016   Status post abdominal hysterectomy and left salpingo-oophorectomy 10/25/2015   Acquired hypothyroidism 03/13/2015   History of iron deficiency anemia 03/13/2015   Hypertension 08/15/2014   GERD (gastroesophageal reflux disease) 05/31/2014   Hyperlipidemia 05/31/2014    Past Surgical History:  Procedure Laterality Date   ABDOMINAL HYSTERECTOMY  08/10/2013   pathology report contains cervix   BREAST BIOPSY Right 05/2015   Pathology revealed stromal fibrosis with intra atrophic benign x2 areas   BREAST BIOPSY Right 09/13/2016   Procedure: BREAST BIOPSY WITH NEEDLE LOCALIZATION;  Surgeon: Dessa Reyes ORN, MD;  Location: [facility];  Service: General;  Laterality: Right;   BREAST EXCISIONAL BIOPSY Right 06/16/2016   lumpectomy complext sclerosing lesion, benign   COLONOSCOPY WITH PROPOFOL  N/A 04/17/2023   Procedure: COLONOSCOPY WITH PROPOFOL ;  Surgeon: Jinny Carmine, MD;  Location: [facility];  Service: Endoscopy;  Laterality: N/A;    Family History  Problem Relation Age of Onset   Diabetes Mother    Heart disease Father    Diabetes Sister    Heart disease Sister    Diabetes Sister    Diabetes Sister    Thyroid  disease Sister    Diabetes Sister    Thyroid  disease Sister    Thyroid  disease Sister    Cancer Neg Hx    Breast cancer Neg Hx     Social History   Tobacco Use   Smoking status: Never   Smokeless tobacco: Never  Substance Use Topics  Alcohol use: No    Alcohol/week: 0.0 standard drinks of alcohol    Current Medications[1]  Allergies[2]  I personally reviewed active problem list, medication list, allergies, family history with the patient/caregiver today.   ROS  Ten systems reviewed and is negative except as mentioned in HPI    Objective Physical Exam CONSTITUTIONAL: Patient appears well-developed and well-nourished. No distress. HEENT: Head atraumatic, normocephalic,  neck supple. CARDIOVASCULAR: Normal rate, regular rhythm and normal heart sounds. No murmur heard. No edema in extremities. PULMONARY: Effort normal and lungs clear to auscultation bilaterally. No respiratory distress. ABDOMINAL: There is no tenderness or distention. MUSCULOSKELETAL: Normal gait. Without gross motor or sensory deficit. PSYCHIATRIC: Patient has a normal mood and affect. Behavior is normal. Judgment and thought content normal.  Vitals:   04/20/24 0940  BP: 128/84  Pulse: 84  Resp: 16  Temp: 98.3 F (36.8 C)  TempSrc: Oral  SpO2: 98%  Weight: 171 lb 3.2 oz (77.7 kg)  Height: 5' 4 (1.626 m)    Body mass index is 29.39 kg/m.    PHQ2/9:    04/20/2024    9:39 AM 12/19/2023    7:44 AM 08/19/2023    8:07 AM 03/31/2023    8:05 AM 02/07/2023    9:38 AM  Depression screen PHQ 2/9  Decreased Interest 0 0 0 0 0  Down, Depressed, Hopeless 0 0 0 0 0  PHQ - 2 Score 0 0 0 0 0  Altered sleeping   0 0   Tired, decreased energy   0 0   Change in appetite   0 0   Feeling bad or failure about yourself    0 0   Trouble concentrating   0 0   Moving slowly or fidgety/restless   0 0   Suicidal thoughts   0 0   PHQ-9 Score   0  0    Difficult doing work/chores   Not difficult at all Not difficult at all      Data saved with a previous flowsheet row definition    phq 9 is negative  Fall Risk:    04/20/2024    9:39 AM 12/19/2023    7:43 AM 12/10/2023   10:57 AM 03/31/2023    7:59 AM 02/07/2023    9:37 AM  Fall Risk   Falls in the past year? 0 0 0 0 0  Number falls in past yr: 0 0 0 0 0  Injury with Fall? 0 0  0  0  0   Risk for fall due to : No Fall Risks No Fall Risks  No Fall Risks No Fall Risks  Follow up Falls evaluation completed Falls evaluation completed Falls evaluation completed Falls prevention discussed;Education provided;Falls evaluation completed Falls prevention discussed     Data saved with a previous flowsheet row definition     Assessment &  Plan Type 2 diabetes mellitus with hypertension and dyslipidemia Diabetes well-controlled with A1c of 6%. Hypertension managed with telmisartan  and hydrochlorothiazide . Dyslipidemia managed with atorvastatin  without side effects. - Ordered lab tests for cholesterol and urine microalbumin. - Continue glipizide  2.5 mg XL in the morning, metformin  750 mg XR twice daily, and pioglitazone  15 mg daily. - Advised to hold glipizide  and metformin  if sick or dehydrated. - Continue telmisartan  and hydrochlorothiazide  80/25 mg. - Continue atorvastatin  20 mg daily.  Acquired hypothyroidism Well-managed with levothyroxine  150 mcg daily. - Ordered TSH test. - Continue levothyroxine  150 mcg daily.  Seasonal allergic rhinitis Managed with Flonase   and loratadine  as needed. - Continue Flonase  as needed. - Continue loratadine  as needed.  General health maintenance Discussed importance of vaccinations and maintaining up-to-date records. - Obtain printout of vaccination records from the hospital. - Schedule mammogram for September. - Continue routine health maintenance visits every six months.        [1]  Current Outpatient Medications:    blood glucose meter kit and supplies, Dispense based on patient and insurance preference. Use up to four times daily as directed. (FOR ICD-10 E10.9, E11.9)., Disp: 1 each, Rfl: 0   fluticasone  (FLONASE ) 50 MCG/ACT nasal spray, Place 2 sprays into both nostrils daily., Disp: 16 g, Rfl: 2   glucose blood (ONETOUCH VERIO) test strip, Use as instructed, Disp: 100 each, Rfl: 12   levothyroxine  (SYNTHROID ) 150 MCG tablet, TAKE 1 TABLET(150 MCG) BY MOUTH DAILY BEFORE BREAKFAST, Disp: 90 tablet, Rfl: 1   loratadine  (CLARITIN ) 10 MG tablet, Take 1 tablet (10 mg total) by mouth daily., Disp: 90 tablet, Rfl: 1   Multiple Vitamin (MULTIVITAMIN) capsule, Take 1 capsule by mouth daily., Disp: , Rfl:    atorvastatin  (LIPITOR) 20 MG tablet, Take 1 tablet (20 mg total) by mouth  daily., Disp: 90 tablet, Rfl: 1   glipiZIDE  (GLUCOTROL  XL) 2.5 MG 24 hr tablet, Take 1 tablet (2.5 mg total) by mouth daily with breakfast., Disp: 90 tablet, Rfl: 1   metFORMIN  (GLUCOPHAGE -XR) 750 MG 24 hr tablet, Take 2 tablets (1,500 mg total) by mouth daily with breakfast., Disp: 180 tablet, Rfl: 1   pioglitazone  (ACTOS ) 15 MG tablet, Take 1 tablet (15 mg total) by mouth daily., Disp: 90 tablet, Rfl: 1   telmisartan -hydrochlorothiazide  (MICARDIS  HCT) 80-25 MG tablet, Take 1 tablet by mouth daily., Disp: 90 tablet, Rfl: 1 [2] No Known Allergies

## 2024-04-21 LAB — COMPREHENSIVE METABOLIC PANEL WITH GFR
AG Ratio: 1.6 (calc) (ref 1.0–2.5)
ALT: 20 U/L (ref 6–29)
AST: 14 U/L (ref 10–35)
Albumin: 4.4 g/dL (ref 3.6–5.1)
Alkaline phosphatase (APISO): 91 U/L (ref 37–153)
BUN: 11 mg/dL (ref 7–25)
CO2: 32 mmol/L (ref 20–32)
Calcium: 9.8 mg/dL (ref 8.6–10.4)
Chloride: 100 mmol/L (ref 98–110)
Creat: 0.54 mg/dL (ref 0.50–1.03)
Globulin: 2.7 g/dL (ref 1.9–3.7)
Glucose, Bld: 187 mg/dL — ABNORMAL HIGH (ref 65–99)
Potassium: 4.3 mmol/L (ref 3.5–5.3)
Sodium: 138 mmol/L (ref 135–146)
Total Bilirubin: 0.4 mg/dL (ref 0.2–1.2)
Total Protein: 7.1 g/dL (ref 6.1–8.1)
eGFR: 106 mL/min/1.73m2

## 2024-04-21 LAB — CBC WITH DIFFERENTIAL/PLATELET
Absolute Lymphocytes: 1736 {cells}/uL (ref 850–3900)
Absolute Monocytes: 300 {cells}/uL (ref 200–950)
Basophils Absolute: 31 {cells}/uL (ref 0–200)
Basophils Relative: 0.8 %
Eosinophils Absolute: 129 {cells}/uL (ref 15–500)
Eosinophils Relative: 3.3 %
HCT: 43.5 % (ref 35.9–46.0)
Hemoglobin: 13.8 g/dL (ref 11.7–15.5)
MCH: 25 pg — ABNORMAL LOW (ref 27.0–33.0)
MCHC: 31.7 g/dL (ref 31.6–35.4)
MCV: 78.8 fL — ABNORMAL LOW (ref 81.4–101.7)
MPV: 12.7 fL — ABNORMAL HIGH (ref 7.5–12.5)
Monocytes Relative: 7.7 %
Neutro Abs: 1704 {cells}/uL (ref 1500–7800)
Neutrophils Relative %: 43.7 %
Platelets: 243 Thousand/uL (ref 140–400)
RBC: 5.52 Million/uL — ABNORMAL HIGH (ref 3.80–5.10)
RDW: 14.4 % (ref 11.0–15.0)
Total Lymphocyte: 44.5 %
WBC: 3.9 Thousand/uL (ref 3.8–10.8)

## 2024-04-21 LAB — LIPID PANEL
Cholesterol: 138 mg/dL
HDL: 79 mg/dL
LDL Cholesterol (Calc): 43 mg/dL
Non-HDL Cholesterol (Calc): 59 mg/dL
Total CHOL/HDL Ratio: 1.7 (calc)
Triglycerides: 76 mg/dL

## 2024-04-21 LAB — MICROALBUMIN / CREATININE URINE RATIO
Creatinine, Urine: 96 mg/dL (ref 20–275)
Microalb Creat Ratio: 2 mg/g{creat}
Microalb, Ur: 0.2 mg/dL

## 2024-04-21 LAB — HEMOGLOBIN A1C
Hgb A1c MFr Bld: 6.3 % — ABNORMAL HIGH
Mean Plasma Glucose: 134 mg/dL
eAG (mmol/L): 7.4 mmol/L

## 2024-04-21 LAB — TSH: TSH: 0.16 m[IU]/L — ABNORMAL LOW (ref 0.40–4.50)

## 2024-04-23 ENCOUNTER — Ambulatory Visit: Payer: Self-pay | Admitting: Family Medicine

## 2024-04-28 ENCOUNTER — Other Ambulatory Visit: Payer: Self-pay | Admitting: Nurse Practitioner

## 2024-04-28 DIAGNOSIS — E039 Hypothyroidism, unspecified: Secondary | ICD-10-CM

## 2024-04-30 NOTE — Telephone Encounter (Signed)
 Too soon for refill, last refill 12/19/23 for 90 and 1 refill.  Requested Prescriptions  Pending Prescriptions Disp Refills   levothyroxine  (SYNTHROID ) 150 MCG tablet [Pharmacy Med Name: LEVOTHYROXINE  0.150MG  ( ) TAB] 90 tablet 1    Sig: TAKE 1 TABLET(150 MCG) BY MOUTH DAILY BEFORE BREAKFAST     Endocrinology:  Hypothyroid Agents Failed - 04/30/2024 10:52 AM      Failed - TSH in normal range and within 360 days    TSH  Date Value Ref Range Status  04/20/2024 0.16 (L) 0.40 - 4.50 mIU/L Final         Passed - Valid encounter within last 12 months    Recent Outpatient Visits           1 week ago Hypertension associated with type 2 diabetes mellitus [facility])   [facility] Bethalto, Dorette, MD   4 months ago Hypertension associated with type 2 diabetes mellitus [facility])   [facility] Gareth Mliss FALCON, FNP   4 months ago Sore throat   [facility] Gareth Mliss F, FNP   8 months ago Dyslipidemia associated with type 2 diabetes mellitus [facility])   [facility] Sowles, Krichna, MD

## 2024-10-19 ENCOUNTER — Ambulatory Visit: Admitting: Family Medicine
